# Patient Record
Sex: Male | Born: 1935
Health system: Southern US, Community
[De-identification: ages and names within clinical notes are randomized; demographics above are authoritative.]

## PROBLEM LIST (undated history)

## (undated) DIAGNOSIS — E782 Mixed hyperlipidemia: Secondary | ICD-10-CM

## (undated) DIAGNOSIS — M545 Low back pain, unspecified: Secondary | ICD-10-CM

## (undated) DIAGNOSIS — R569 Unspecified convulsions: Secondary | ICD-10-CM

## (undated) DIAGNOSIS — I493 Ventricular premature depolarization: Secondary | ICD-10-CM

## (undated) DIAGNOSIS — I219 Acute myocardial infarction, unspecified: Secondary | ICD-10-CM

## (undated) DIAGNOSIS — I5181 Takotsubo syndrome: Secondary | ICD-10-CM

## (undated) DIAGNOSIS — I739 Peripheral vascular disease, unspecified: Secondary | ICD-10-CM

## (undated) DIAGNOSIS — I341 Nonrheumatic mitral (valve) prolapse: Secondary | ICD-10-CM

## (undated) DIAGNOSIS — I6529 Occlusion and stenosis of unspecified carotid artery: Secondary | ICD-10-CM

## (undated) DIAGNOSIS — C649 Malignant neoplasm of unspecified kidney, except renal pelvis: Secondary | ICD-10-CM

## (undated) DIAGNOSIS — I2510 Atherosclerotic heart disease of native coronary artery without angina pectoris: Secondary | ICD-10-CM

## (undated) DIAGNOSIS — I779 Disorder of arteries and arterioles, unspecified: Secondary | ICD-10-CM

## (undated) DIAGNOSIS — I10 Essential (primary) hypertension: Secondary | ICD-10-CM

## (undated) DIAGNOSIS — Z9289 Personal history of other medical treatment: Secondary | ICD-10-CM

## (undated) DIAGNOSIS — G8929 Other chronic pain: Secondary | ICD-10-CM

## (undated) DIAGNOSIS — M1990 Unspecified osteoarthritis, unspecified site: Secondary | ICD-10-CM

## (undated) DIAGNOSIS — I499 Cardiac arrhythmia, unspecified: Secondary | ICD-10-CM

## (undated) DIAGNOSIS — R351 Nocturia: Secondary | ICD-10-CM

## (undated) HISTORY — DX: Essential (primary) hypertension: I10

## (undated) HISTORY — DX: Occlusion and stenosis of unspecified carotid artery: I65.29

## (undated) HISTORY — DX: Ventricular premature depolarization: I49.3

## (undated) HISTORY — DX: Mixed hyperlipidemia: E78.2

## (undated) HISTORY — PX: JOINT REPLACEMENT: SHX530

## (undated) HISTORY — DX: Malignant neoplasm of unspecified kidney, except renal pelvis: C64.9

## (undated) HISTORY — DX: Takotsubo syndrome: I51.81

## (undated) HISTORY — DX: Disorder of arteries and arterioles, unspecified: I77.9

## (undated) HISTORY — DX: Nonrheumatic mitral (valve) prolapse: I34.1

## (undated) HISTORY — PX: LITHOTRIPSY: SUR834

## (undated) HISTORY — PX: CARDIAC CATHETERIZATION: SHX172

## (undated) HISTORY — PX: APPENDECTOMY: SHX54

## (undated) HISTORY — PX: KNEE ARTHROSCOPY: SUR90

---

## 1999-07-12 ENCOUNTER — Encounter (INDEPENDENT_AMBULATORY_CARE_PROVIDER_SITE_OTHER): Payer: Self-pay | Admitting: *Deleted

## 1999-07-12 ENCOUNTER — Ambulatory Visit (HOSPITAL_COMMUNITY): Admission: RE | Admit: 1999-07-12 | Discharge: 1999-07-12 | Payer: Self-pay | Admitting: Gastroenterology

## 2000-01-11 ENCOUNTER — Encounter: Admission: RE | Admit: 2000-01-11 | Discharge: 2000-03-04 | Payer: Self-pay | Admitting: *Deleted

## 2002-07-31 ENCOUNTER — Ambulatory Visit (HOSPITAL_COMMUNITY): Admission: RE | Admit: 2002-07-31 | Discharge: 2002-07-31 | Payer: Self-pay | Admitting: Gastroenterology

## 2002-07-31 ENCOUNTER — Encounter (INDEPENDENT_AMBULATORY_CARE_PROVIDER_SITE_OTHER): Payer: Self-pay | Admitting: Specialist

## 2002-09-25 ENCOUNTER — Ambulatory Visit (HOSPITAL_COMMUNITY): Admission: RE | Admit: 2002-09-25 | Discharge: 2002-09-25 | Payer: Self-pay | Admitting: Gastroenterology

## 2004-06-20 ENCOUNTER — Ambulatory Visit: Payer: Self-pay | Admitting: Family Medicine

## 2004-10-23 ENCOUNTER — Ambulatory Visit: Payer: Self-pay | Admitting: Family Medicine

## 2005-02-22 ENCOUNTER — Ambulatory Visit: Payer: Self-pay | Admitting: Family Medicine

## 2005-06-26 ENCOUNTER — Ambulatory Visit: Payer: Self-pay | Admitting: Family Medicine

## 2005-07-10 ENCOUNTER — Ambulatory Visit: Payer: Self-pay | Admitting: Family Medicine

## 2005-08-23 ENCOUNTER — Ambulatory Visit: Payer: Self-pay | Admitting: Family Medicine

## 2005-09-20 ENCOUNTER — Ambulatory Visit: Payer: Self-pay | Admitting: Family Medicine

## 2005-09-21 ENCOUNTER — Ambulatory Visit: Payer: Self-pay | Admitting: Family Medicine

## 2005-11-27 ENCOUNTER — Ambulatory Visit: Payer: Self-pay | Admitting: Family Medicine

## 2005-12-03 ENCOUNTER — Ambulatory Visit: Payer: Self-pay | Admitting: Family Medicine

## 2006-02-27 ENCOUNTER — Ambulatory Visit: Payer: Self-pay | Admitting: Family Medicine

## 2006-07-01 ENCOUNTER — Ambulatory Visit: Payer: Self-pay | Admitting: Family Medicine

## 2006-07-16 ENCOUNTER — Ambulatory Visit: Payer: Self-pay | Admitting: Physician Assistant

## 2006-07-30 ENCOUNTER — Ambulatory Visit: Payer: Self-pay | Admitting: Family Medicine

## 2006-08-06 ENCOUNTER — Ambulatory Visit: Payer: Self-pay | Admitting: Family Medicine

## 2006-08-15 ENCOUNTER — Ambulatory Visit: Payer: Self-pay | Admitting: Family Medicine

## 2006-08-20 HISTORY — PX: NEPHRECTOMY: SHX65

## 2006-09-10 ENCOUNTER — Ambulatory Visit: Payer: Self-pay | Admitting: Family Medicine

## 2006-09-18 ENCOUNTER — Ambulatory Visit: Payer: Self-pay | Admitting: Family Medicine

## 2006-09-24 ENCOUNTER — Ambulatory Visit: Payer: Self-pay | Admitting: Family Medicine

## 2006-10-14 ENCOUNTER — Ambulatory Visit: Payer: Self-pay | Admitting: Family Medicine

## 2006-10-18 ENCOUNTER — Ambulatory Visit (HOSPITAL_COMMUNITY): Admission: RE | Admit: 2006-10-18 | Discharge: 2006-10-18 | Payer: Self-pay | Admitting: Orthopaedic Surgery

## 2006-10-30 ENCOUNTER — Encounter: Admission: RE | Admit: 2006-10-30 | Discharge: 2007-01-28 | Payer: Self-pay | Admitting: Orthopaedic Surgery

## 2006-11-13 ENCOUNTER — Ambulatory Visit: Payer: Self-pay | Admitting: Family Medicine

## 2006-12-16 ENCOUNTER — Ambulatory Visit: Payer: Self-pay | Admitting: Family Medicine

## 2007-01-16 ENCOUNTER — Ambulatory Visit: Payer: Self-pay | Admitting: Family Medicine

## 2008-06-04 ENCOUNTER — Encounter: Payer: Self-pay | Admitting: Cardiology

## 2008-06-05 ENCOUNTER — Encounter: Payer: Self-pay | Admitting: Cardiology

## 2008-08-20 HISTORY — PX: TOTAL KNEE ARTHROPLASTY: SHX125

## 2009-03-11 ENCOUNTER — Encounter: Payer: Self-pay | Admitting: Cardiology

## 2009-03-12 ENCOUNTER — Encounter: Payer: Self-pay | Admitting: Cardiology

## 2009-03-14 ENCOUNTER — Encounter: Payer: Self-pay | Admitting: Cardiology

## 2009-03-23 ENCOUNTER — Encounter: Payer: Self-pay | Admitting: Cardiology

## 2009-04-05 ENCOUNTER — Encounter: Payer: Self-pay | Admitting: Cardiology

## 2009-04-06 ENCOUNTER — Ambulatory Visit: Payer: Self-pay | Admitting: Cardiology

## 2009-04-06 ENCOUNTER — Encounter: Payer: Self-pay | Admitting: Cardiology

## 2009-04-06 DIAGNOSIS — Z8679 Personal history of other diseases of the circulatory system: Secondary | ICD-10-CM | POA: Insufficient documentation

## 2009-04-06 DIAGNOSIS — I498 Other specified cardiac arrhythmias: Secondary | ICD-10-CM | POA: Insufficient documentation

## 2009-04-12 ENCOUNTER — Ambulatory Visit: Payer: Self-pay | Admitting: Cardiology

## 2009-04-12 ENCOUNTER — Encounter: Payer: Self-pay | Admitting: Cardiology

## 2009-04-21 ENCOUNTER — Encounter (INDEPENDENT_AMBULATORY_CARE_PROVIDER_SITE_OTHER): Payer: Self-pay | Admitting: *Deleted

## 2009-05-02 ENCOUNTER — Telehealth (INDEPENDENT_AMBULATORY_CARE_PROVIDER_SITE_OTHER): Payer: Self-pay | Admitting: *Deleted

## 2009-05-17 ENCOUNTER — Ambulatory Visit: Payer: Self-pay | Admitting: Cardiology

## 2009-05-24 ENCOUNTER — Ambulatory Visit: Payer: Self-pay | Admitting: Cardiology

## 2009-05-27 ENCOUNTER — Encounter: Payer: Self-pay | Admitting: Cardiology

## 2009-10-19 ENCOUNTER — Ambulatory Visit: Payer: Self-pay | Admitting: Cardiology

## 2009-10-25 ENCOUNTER — Encounter: Payer: Self-pay | Admitting: Cardiology

## 2010-09-06 ENCOUNTER — Encounter: Payer: Self-pay | Admitting: Cardiology

## 2010-09-19 NOTE — Assessment & Plan Note (Signed)
Summary: f51m -- agh   Visit Type:  Follow-up Primary Provider:  nyland  CC:  follow-up visit.  History of Present Illness: the patient is a 77 year old male with no significant history of coronary artery disease.  In the past he has been evaluated for bigeminy.  He does have normal LV function and essentially normal echocardiogram with no significant valvular heart disease.  Is also asymptomatic.  He denies any palpitations, presyncope or syncope.  Denies any chest pain orthopnea or PND.  He presents for routine follow-up today.  The patient has worn a 48-hour Holter monitor which showed no significant arrhythmias short of bigeminy.   Preventive Screening-Counseling & Management  Alcohol-Tobacco     Smoking Status: never  Current Problems (verified): 1)  Preoperative Examination  (ICD-V72.84) 2)  Cardiac Murmur, Hx of  (ICD-V12.50) 3)  Bigeminy  (ICD-427.89) 4)  Bradycardia  (ICD-427.89)  Current Medications (verified): 1)  Aspirin 81 Mg Tbec (Aspirin) .... Take One Tablet By Mouth Daily 2)  Multivitamins   Tabs (Multiple Vitamin) .... Once Daily 3)  Simvastatin 40 Mg Tabs (Simvastatin) .... Take One Tablet By Mouth Daily At Bedtime 4)  Quinapril Hcl 40 Mg Tabs (Quinapril Hcl) .... Once By Mouth  Daily 5)  Furosemide 20 Mg Tabs (Furosemide) .... Take One Tablet By Mouth Daily. 6)  Felodipine 2.5 Mg Xr24h-Tab (Felodipine) .... Take 1 Tablet By Mouth Once A Day 7)  Benicar Hct 20-12.5 Mg Tabs (Olmesartan Medoxomil-Hctz) .... Take 1 Tablet By Mouth Once A Day  Allergies (verified): No Known Drug Allergies  Comments:  Nurse/Medical Assistant: The patient's medications and allergies were reviewed with the patient and were updated in the Medication and Allergy Lists. List reviewed.  Past History:  Past Medical History: Last updated: 04/06/2009 hypertension Hyperlipidemia  Past Surgical History: Last updated: 04/06/2009 Nephrectomy-Native Appendectomy Knee  Arthroscopy  Family History: Last updated: 04/06/2009 Family History of Cancer:  Family History of Coronary Artery Disease:  Family History of CVA or Stroke:  Family History of Hypertension:  Family History of Renal Disease:   Social History: Last updated: 04/06/2009 Retired  Married  Tobacco Use - No.  Alcohol Use - no Regular Exercise - no Drug Use - no  Risk Factors: Exercise: no (04/06/2009)  Risk Factors: Smoking Status: never (10/19/2009)  Vital Signs:  Patient profile:   77 year old male Height:      73 inches Weight:      222 pounds Pulse rate:   72 / minute BP sitting:   159 / 66  (left arm) Cuff size:   large  Vitals Entered By: Carlye Grippe (October 19, 2009 9:37 AM) CC: follow-up visit   Physical Exam  Additional Exam:  General: Well-developed, well-nourished in no distress head: Normocephalic and atraumatic eyes PERRLA/EOMI intact, conjunctiva and lids normal nose: No deformity or lesions mouth normal dentition, normal posterior pharynx neck: Supple, no JVD.  No masses, thyromegaly or abnormal cervical nodes.  No carotid bruits lungs: Normal breath sounds bilaterally without wheezing.  Normal percussion heart: regular rate and rhythm with normal S1 and S2, no S3 or S4.  PMI is normal.  No pathological murmurs abdomen: Normal bowel sounds, abdomen is soft and nontender without masses, organomegaly or hernias noted.  No hepatosplenomegaly musculoskeletal: Back normal, normal gait muscle strength and tone normal pulsus: Pulse is normal in all 4 extremities Extremities: No peripheral pitting edema neurologic: Alert and oriented x 3 skin: Intact without lesions or rashes cervical nodes: No significant adenopathy  psychologic: Normal affect    EKG  Procedure date:  10/19/2009  Findings:      normal sinus rhythm with frequent premature ventricular complexes with ventricular bigeminy.  Nonspecific ST-T wave changes.  Impression &  Recommendations:  Problem # 1:  BIGEMINY (ICD-427.89) the patient has by EKG ongoing asymptomatic ventricular bigeminy.  He reports no syncope or presyncope or palpitations.  His LV function is within normal limits. His updated medication list for this problem includes:    Aspirin 81 Mg Tbec (Aspirin) .Marland Kitchen... Take one tablet by mouth daily    Quinapril Hcl 40 Mg Tabs (Quinapril hcl) ..... Once by mouth  daily    Felodipine 2.5 Mg Xr24h-tab (Felodipine) .Marland Kitchen... Take 1 tablet by mouth once a day  Problem # 2:  CARDIAC MURMUR, HX OF (ICD-V12.50) the patient has no significant valvular heart disease by echocardiogram  Problem # 3:  BRADYCARDIA (ICD-427.89) no evidence of significant bradycardia by either Holter monitor or today's physical examination. His updated medication list for this problem includes:    Aspirin 81 Mg Tbec (Aspirin) .Marland Kitchen... Take one tablet by mouth daily    Quinapril Hcl 40 Mg Tabs (Quinapril hcl) ..... Once by mouth  daily    Felodipine 2.5 Mg Xr24h-tab (Felodipine) .Marland Kitchen... Take 1 tablet by mouth once a day  Orders: EKG w/ Interpretation (93000)  Patient Instructions: 1)  Your physician recommends that you continue on your current medications as directed. Please refer to the Current Medication list given to you today. 2)  Follow up in  1 year.

## 2010-10-27 ENCOUNTER — Ambulatory Visit (INDEPENDENT_AMBULATORY_CARE_PROVIDER_SITE_OTHER): Payer: MEDICARE | Admitting: Cardiology

## 2010-10-27 ENCOUNTER — Encounter: Payer: Self-pay | Admitting: Physician Assistant

## 2010-10-27 DIAGNOSIS — I38 Endocarditis, valve unspecified: Secondary | ICD-10-CM

## 2010-10-27 DIAGNOSIS — I10 Essential (primary) hypertension: Secondary | ICD-10-CM

## 2010-11-07 NOTE — Assessment & Plan Note (Signed)
Summary: 77YR FUL FH   Visit Type:  Follow-up Primary Provider:  Darcel Bayley Nyland,MD   History of Present Illness: patient presents for annual followup.  He denies interim development of signs or symptoms suggestive of UA P, DOE, or tachypalpitations. He has been diagnosed with vertigo, but denies any gait instability, falls, or syncope.  He has occasional palpitations, but has history of documented ventricular bigeminy, by EKG and Holter monitoring, with no other significant arrhythmias. He has history of normal LVF, with no significant valvular abnormalities.  Preventive Screening-Counseling & Management  Alcohol-Tobacco     Smoking Status: never  Current Medications (verified): 1)  Aspirin 81 Mg Tbec (Aspirin) .... Take One Tablet By Mouth Daily 2)  Multivitamins   Tabs (Multiple Vitamin) .... Once Daily 3)  Atorvastatin Calcium 40 Mg Tabs (Atorvastatin Calcium) .... Take 1/2 Tablet By Mouth Once A Day 4)  Quinapril Hcl 40 Mg Tabs (Quinapril Hcl) .... Once By Mouth  Daily 5)  Furosemide 20 Mg Tabs (Furosemide) .... Take One Tablet By Mouth Daily. 6)  Felodipine 2.5 Mg Xr24h-Tab (Felodipine) .... Take 1 Tablet By Mouth Once A Day 7)  Benicar Hct 20-12.5 Mg Tabs (Olmesartan Medoxomil-Hctz) .... Take 1 Tablet By Mouth Once A Day 8)  Travatan Z 0.004 % Soln (Travoprost) .... One Drop Ou Daily  Allergies (verified): No Known Drug Allergies  Comments:  Nurse/Medical Assistant: The patient's medication bottles and allergies were reviewed with the patient and were updated in the Medication and Allergy Lists.  Past History:  Past Medical History: hypertension Hyperlipidemia ventricular ectopy/bigeminy Normal LVF valvular heart disease... mild MVP/MR by 2-D echo, 8/10  Review of Systems       No fevers, chills, hemoptysis, dysphagia, melena, hematocheezia, hematuria, rash, claudication, orthopnea, pnd, pedal edema. All other systems negative.   Vital Signs:  Patient  profile:   77 year old male Height:      73 inches Weight:      221 pounds BMI:     29.26 Pulse rate:   66 / minute BP sitting:   151 / 77  (left arm) Cuff size:   large  Vitals Entered By: Carlye Grippe (October 27, 2010 2:08 PM)  Nutrition Counseling: Patient's BMI is greater than 25 and therefore counseled on weight management options.  Physical Exam  Additional Exam:  GEN: 77 year old male, no distress HEENT: NCAT,PERRLA,EOMI NECK: palpable pulses, no bruits; no JVD; no TM LUNGS: CTA bilaterally HEART: RRR (S1S2); no significant murmurs; no rubs; no gallops ABD: soft, NT; intact BS EXT: intact distal pulses; 1+ bilateral, nonpitting edema SKIN: warm, dry MUSC: no obvious deformity NEURO: A/O (x3)     EKG  Procedure date:  10/27/2010  Findings:      normal sinus rhythm at 70 bpm, with ventricular bigeminy; normal axis; nonspecific ST changes  Impression & Recommendations:  Problem # 1:  BIGEMINY (ICD-427.89)  long-standing, documented PVCs and bigeminy, with no acceleration by history. No further workup, or medication adjustments, at this time. Schedule return follow up with Dr. Andee Lineman in one year, or sooner if needed  Problem # 2:  HYPERTENSION (ICD-401.9)  followed by Dr. Lysbeth Galas. Patient advised to occasionally monitor this in the ambulatory setting, maintaining readings less than 140/90.  Problem # 3:  VALVULAR HEART DISEASE (ICD-424.90)  mild MVP/MR by 2-D echo, 8/10. continue monitoring clinically.  Other Orders: EKG w/ Interpretation (93000)  Patient Instructions: 1)  Your physician wants you to follow-up in: 1 year. You will receive a  reminder letter in the mail one-two months in advance. If you don't receive a letter, please call our office to schedule the follow-up appointment.

## 2011-01-05 NOTE — Op Note (Signed)
NAME:  Darius Baxter, Darius Baxter                         ACCOUNT NO.:  [id number]   MEDICAL RECORD NO.:  [id number]                   PATIENT TYPE:  AMB   LOCATION:  ENDO                                 FACILITY:  [facility]   PHYSICIAN:  John C. Madilyn Fireman, M.D.                 DATE OF BIRTH:  1934-07-15   DATE OF PROCEDURE:  07/31/2002  DATE OF DISCHARGE:                                 OPERATIVE REPORT   PROCEDURE:  Colonoscopy with polypectomy.   INDICATIONS FOR PROCEDURE:  History of adenomatous colon polyp at  colonoscopy three years ago.   DESCRIPTION OF PROCEDURE:  The patient was placed in the left lateral  decubitus position and placed on the pulse monitor with continuous low-flow  oxygen delivered by nasal cannula.  He was sedated with 75 mcg IV fentanyl  and 6 mg IV Versed.  The Olympus video colonoscope was inserted into the  rectum and advanced to the cecum, confirmed by transillumination at  McBurney's point and visualization of the ileocecal valve and appendiceal  orifice.  The prep was fairly good, but there were some areas where the prep  was suboptimal, and I cannot rule out small lesions less than 1 cm in all  areas.  The cecum appeared normal.  Within the mid ascending colon, there  was a 1.5-2 cm multilobulated polyp or sessile polypoid mass that was  vaguely delineated from the surrounding mucosa that was elevated  significantly.  It was difficult to position the scope in a manner that  would allow me to direct the snare down to the base of it due to the  location just around a bend, and this made assessment somewhat difficult.  I  was able to ensnare about an estimated third of the visible area of the  polyp and send for histology.  I then used the closed hot biopsy forceps to  fulgurate as much of the surface as possible, but it was clear that the  lesion was not entirely removed but fulgurated.  The remainder of the  ascending, transverse, descending, and sigmoid and rectum  appeared normal  with no further polyps, masses, diverticula, or other mucosal abnormalities.  The scope was then withdrawn, and the patient returned to the recovery room  in stable condition.  He tolerated the procedure well, and there were no  immediate complications.   IMPRESSION:  Multilobulated ascending colon polyp or polypoid mass.   PLAN:  Await histology and if severe dysplasia or carcinoma seen, will  likely need surgery.  If not, a repeat attempt at colonoscopic destruction  of the lesion in 2-3 months may be worthwhile.                                               Jonny Ruiz  Morrie Sheldon, M.D.    JCH/MEDQ  D:  07/31/2002  T:  07/31/2002  Job:  [id number]

## 2011-01-05 NOTE — Op Note (Signed)
   NAME:  Darius Baxter, Darius Baxter                         ACCOUNT NO.:  [id number]   MEDICAL RECORD NO.:  [id number]                   PATIENT TYPE:  AMB   LOCATION:  ENDO                                 FACILITY:  [facility]   PHYSICIAN:  John C. Madilyn Fireman, M.D.                 DATE OF BIRTH:  May 04, 1934   DATE OF PROCEDURE:  09/25/2002  DATE OF DISCHARGE:                                 OPERATIVE REPORT   PROCEDURE:  Colonoscopy.   INDICATIONS FOR PROCEDURE:  Sessile ascending colon polyp, incompletely  removed two months ago with histology showing tubular adenoma.  Procedure is  to try to attempt complete fulguration of the polyp today.   DESCRIPTION OF PROCEDURE:  The patient was placed in the left lateral  decubitus position and placed on the pulse monitor with continuous low-flow  oxygen delivered by nasal cannula.  He was sedated with 75 mcg IV fentanyl  and 6 mg IV Versed.  The Olympus video colonoscope was inserted into the  rectum and advanced to the cecum, confirmed by transillumination at  McBurney's point and visualization of the ileocecal valve and appendiceal  orifice.  The prep was excellent.  The cecum appeared normal.  Within the  mid ascending colon, there was evidence of the previous polyp, although  there was not a lot of visible polyp tissue.  There was some fibrosis and  inflammation from the previous polypectomy site and some vaguely delineated  polyp tissue.  I elected to use the forward-oriented Erbe probe to fulgurate  all visible surface that appeared to contain adenoma.  This was achieved on  the last attempt at laser application; however, some bleeding ensued, and it  took 2-3 more applications to get the bleeding to stop.  It appeared that  the entire surface of the polyp was coagulated and probably destroyed.  The  remainder of the ascending, transverse, descending, sigmoid, and rectum all  appeared normal with no masses, polyps, diverticula, or other mucosal  abnormalities.  The scope was then withdrawn, and the patient returned to  the recovery room in stable condition.  He tolerated the procedure well, and  there were no immediate complications.   IMPRESSION:  Residual ascending colon polyps, fulgurated with the Erbe argon  plasma coagulator.   PLAN:  Repeat colonoscopy in three years.                                               John C. Madilyn Fireman, M.D.    JCH/MEDQ  D:  09/25/2002  T:  09/25/2002  Job:  [id number]

## 2011-02-22 ENCOUNTER — Encounter: Payer: Self-pay | Admitting: Physician Assistant

## 2011-03-15 ENCOUNTER — Encounter (HOSPITAL_COMMUNITY)
Admission: RE | Admit: 2011-03-15 | Discharge: 2011-03-15 | Disposition: A | Discharge status: Home / Self Care | Payer: Medicare Other | Source: Ambulatory Visit | Attending: Ophthalmology | Admitting: Ophthalmology

## 2011-03-15 ENCOUNTER — Encounter (HOSPITAL_COMMUNITY): Payer: Self-pay

## 2011-03-15 ENCOUNTER — Other Ambulatory Visit: Payer: Self-pay

## 2011-03-15 HISTORY — DX: Unspecified convulsions: R56.9

## 2011-03-15 HISTORY — DX: Unspecified osteoarthritis, unspecified site: M19.90

## 2011-03-15 LAB — BASIC METABOLIC PANEL
BUN: 30 mg/dL — ABNORMAL HIGH (ref 6–23)
CO2: 26 mEq/L (ref 19–32)
Chloride: 103 mEq/L (ref 96–112)
GFR calc non Af Amer: 53 mL/min — ABNORMAL LOW (ref >60)
Glucose, Bld: 95 mg/dL (ref 70–99)

## 2011-03-15 LAB — CBC
HCT: 40.4 % (ref 39.0–52.0)
MCHC: 33.7 g/dL (ref 30.0–36.0)
RBC: 4.43 MIL/uL (ref 4.22–5.81)
RDW: 13.5 % (ref 11.5–15.5)
WBC: 7 10*3/uL (ref 4.0–10.5)

## 2011-03-15 NOTE — Patient Instructions (Addendum)
22 Darius Baxter  03/15/2011   Your procedure is scheduled on:  03/22/2011  Report to [facility] at  620 AM.  Call this number if you have problems the morning of surgery: [phone]   Remember:   Do not eat food:After Midnight.  Do not drink clear liquids: After Midnight.  Take these medicines the morning of surgery with A SIP OF WATER: Plendil,Benicar,Quinapril,accupril   Do not wear jewelry, make-up or nail polish.  Do not bring valuables to the hospital.  Contacts, dentures or bridgework may not be worn into surgery.  Leave suitcase in the car. After surgery it may be brought to your room.  For patients admitted to the hospital, checkout time is 11:00 AM the day of discharge.   Patients discharged the day of surgery will not be allowed to drive home.  Name and phone number of your driver: wife  Special Instructions: N/A   Please read over the following fact sheets that you were given: Pain Booklet, Surgical Site Infection Prevention and Anesthesia Post-op Instructions PATIENT INSTRUCTIONS POST-ANESTHESIA  IMMEDIATELY FOLLOWING SURGERY:  Do not drive or operate machinery for the first twenty four hours after surgery.  Do not make any important decisions for twenty four hours after surgery or while taking narcotic pain medications or sedatives.  If you develop intractable nausea and vomiting or a severe headache please notify your doctor immediately.  FOLLOW-UP:  Please make an appointment with your surgeon as instructed. You do not need to follow up with anesthesia unless specifically instructed to do so.  WOUND CARE INSTRUCTIONS (if applicable):  Keep a dry clean dressing on the anesthesia/puncture wound site if there is drainage.  Once the wound has quit draining you may leave it open to air.  Generally you should leave the bandage intact for twenty four hours unless there is drainage.  If the epidural site drains for more than 36-48 hours please call the anesthesia  department.  QUESTIONS?:  Please feel free to call your physician or the hospital operator if you have any questions, and they will be happy to assist you.     [facility] Anesthesia Department [address] [place] [phone]

## 2011-03-22 ENCOUNTER — Ambulatory Visit (HOSPITAL_COMMUNITY)
Admission: RE | Admit: 2011-03-22 | Discharge: 2011-03-22 | Disposition: A | Discharge status: Home / Self Care | Payer: Medicare Other | Source: Ambulatory Visit | Attending: Ophthalmology | Admitting: Ophthalmology

## 2011-03-22 ENCOUNTER — Encounter (HOSPITAL_COMMUNITY): Payer: Self-pay | Admitting: Anesthesiology

## 2011-03-22 ENCOUNTER — Ambulatory Visit (HOSPITAL_COMMUNITY): Payer: Medicare Other | Admitting: Anesthesiology

## 2011-03-22 ENCOUNTER — Encounter (HOSPITAL_COMMUNITY): Payer: Self-pay | Admitting: *Deleted

## 2011-03-22 ENCOUNTER — Encounter (HOSPITAL_COMMUNITY)
Admission: RE | Disposition: A | Discharge status: Home / Self Care | Payer: Self-pay | Source: Ambulatory Visit | Attending: Ophthalmology

## 2011-03-22 DIAGNOSIS — I10 Essential (primary) hypertension: Secondary | ICD-10-CM | POA: Insufficient documentation

## 2011-03-22 DIAGNOSIS — Z01812 Encounter for preprocedural laboratory examination: Secondary | ICD-10-CM | POA: Insufficient documentation

## 2011-03-22 DIAGNOSIS — Z79899 Other long term (current) drug therapy: Secondary | ICD-10-CM | POA: Insufficient documentation

## 2011-03-22 DIAGNOSIS — H2510 Age-related nuclear cataract, unspecified eye: Secondary | ICD-10-CM | POA: Insufficient documentation

## 2011-03-22 DIAGNOSIS — Z7982 Long term (current) use of aspirin: Secondary | ICD-10-CM | POA: Insufficient documentation

## 2011-03-22 HISTORY — PX: CATARACT EXTRACTION W/PHACO: SHX586

## 2011-03-22 SURGERY — PHACOEMULSIFICATION, CATARACT, WITH IOL INSERTION
Anesthesia: Monitor Anesthesia Care | Site: Eye | Laterality: Right | Wound class: Clean

## 2011-03-22 MED ORDER — TETRACAINE HCL 0.5 % OP SOLN
OPHTHALMIC | Status: AC
  Administered 2011-03-22: 1 [drp] via OPHTHALMIC
  Filled 2011-03-22: qty 2

## 2011-03-22 MED ORDER — LACTATED RINGERS IV SOLN
INTRAVENOUS | Status: DC | PRN
  Administered 2011-03-22: 07:00:00 via INTRAVENOUS

## 2011-03-22 MED ORDER — LIDOCAINE HCL 3.5 % OP GEL
OPHTHALMIC | Status: AC
  Administered 2011-03-22: 1 via OPHTHALMIC
  Filled 2011-03-22: qty 5

## 2011-03-22 MED ORDER — LIDOCAINE HCL 3.5 % OP GEL
1.0000 "application " | Freq: Once | OPHTHALMIC | Status: AC
  Administered 2011-03-22: 1 via OPHTHALMIC

## 2011-03-22 MED ORDER — LACTATED RINGERS IV SOLN
INTRAVENOUS | Status: DC
  Administered 2011-03-22: 07:00:00 via INTRAVENOUS

## 2011-03-22 MED ORDER — LIDOCAINE HCL (PF) 1 % IJ SOLN
INTRAMUSCULAR | Status: AC
  Filled 2011-03-22: qty 2

## 2011-03-22 MED ORDER — CYCLOPENTOLATE-PHENYLEPHRINE 0.2-1 % OP SOLN
1.0000 [drp] | OPHTHALMIC | Status: AC
  Administered 2011-03-22 (×3): 1 [drp] via OPHTHALMIC

## 2011-03-22 MED ORDER — LIDOCAINE HCL (PF) 1 % IJ SOLN
INTRAMUSCULAR | Status: DC | PRN
  Administered 2011-03-22: .2 mL

## 2011-03-22 MED ORDER — MIDAZOLAM HCL 2 MG/2ML IJ SOLN
INTRAMUSCULAR | Status: AC
  Filled 2011-03-22: qty 2

## 2011-03-22 MED ORDER — PROVISC 10 MG/ML IO SOLN
INTRAOCULAR | Status: DC | PRN
  Administered 2011-03-22: 8.5 mg via OPHTHALMIC

## 2011-03-22 MED ORDER — PHENYLEPHRINE HCL 2.5 % OP SOLN
OPHTHALMIC | Status: AC
  Administered 2011-03-22: 1 [drp] via OPHTHALMIC
  Filled 2011-03-22: qty 2

## 2011-03-22 MED ORDER — MIDAZOLAM HCL 2 MG/2ML IJ SOLN
1.0000 mg | INTRAMUSCULAR | Status: DC | PRN
Start: 2011-03-22 — End: 2011-03-22
  Administered 2011-03-22: 2 mg via INTRAVENOUS

## 2011-03-22 MED ORDER — NEOMYCIN-POLYMYXIN-DEXAMETH 3.5-10000-0.1 OP OINT
TOPICAL_OINTMENT | OPHTHALMIC | Status: AC
  Filled 2011-03-22: qty 3.5

## 2011-03-22 MED ORDER — TETRACAINE HCL 0.5 % OP SOLN
1.0000 [drp] | OPHTHALMIC | Status: AC
  Administered 2011-03-22 (×3): 1 [drp] via OPHTHALMIC

## 2011-03-22 MED ORDER — EPINEPHRINE HCL 1 MG/ML IJ SOLN
INTRAOCULAR | Status: DC | PRN
  Administered 2011-03-22: 08:00:00

## 2011-03-22 MED ORDER — BSS IO SOLN
INTRAOCULAR | Status: DC | PRN
  Administered 2011-03-22: 15 mL via OPHTHALMIC

## 2011-03-22 MED ORDER — PHENYLEPHRINE HCL 2.5 % OP SOLN
1.0000 [drp] | OPHTHALMIC | Status: AC
  Administered 2011-03-22 (×3): 1 [drp] via OPHTHALMIC

## 2011-03-22 MED ORDER — POVIDONE-IODINE 5 % OP SOLN
OPHTHALMIC | Status: DC | PRN
  Administered 2011-03-22: 1 via OPHTHALMIC

## 2011-03-22 MED ORDER — NEOMYCIN-POLYMYXIN-DEXAMETH 0.1 % OP OINT
TOPICAL_OINTMENT | OPHTHALMIC | Status: DC | PRN
  Administered 2011-03-22: 1 via OPHTHALMIC

## 2011-03-22 MED ORDER — CYCLOPENTOLATE-PHENYLEPHRINE 0.2-1 % OP SOLN
OPHTHALMIC | Status: AC
  Administered 2011-03-22: 1 [drp] via OPHTHALMIC
  Filled 2011-03-22: qty 2

## 2011-03-22 MED ORDER — LIDOCAINE HCL 3.5 % OP GEL
OPHTHALMIC | Status: DC | PRN
  Administered 2011-03-22: 1 via OPHTHALMIC

## 2011-03-22 SURGICAL SUPPLY — 34 items
CAPSULAR TENSION RING-AMO (OPHTHALMIC RELATED) IMPLANT
CLOTH BEACON ORANGE TIMEOUT ST (SAFETY) ×2 IMPLANT
DUOVISC SYSTEM (INTRAOCULAR LENS)
EYE SHIELD UNIVERSAL CLEAR (GAUZE/BANDAGES/DRESSINGS) ×2 IMPLANT
GLOVE BIO SURGEON STRL SZ 6.5 (GLOVE) IMPLANT
GLOVE BIOGEL PI IND STRL 6.5 (GLOVE) ×1 IMPLANT
GLOVE BIOGEL PI IND STRL 7.0 (GLOVE) IMPLANT
GLOVE BIOGEL PI IND STRL 7.5 (GLOVE) IMPLANT
GLOVE BIOGEL PI INDICATOR 6.5 (GLOVE) ×1
GLOVE BIOGEL PI INDICATOR 7.0 (GLOVE)
GLOVE BIOGEL PI INDICATOR 7.5 (GLOVE)
GLOVE ECLIPSE 6.5 STRL STRAW (GLOVE) IMPLANT
GLOVE ECLIPSE 7.0 STRL STRAW (GLOVE) IMPLANT
GLOVE ECLIPSE 7.5 STRL STRAW (GLOVE) IMPLANT
GLOVE EXAM NITRILE LRG STRL (GLOVE) ×2 IMPLANT
GLOVE EXAM NITRILE MD LF STRL (GLOVE) IMPLANT
GLOVE SKINSENSE NS SZ6.5 (GLOVE)
GLOVE SKINSENSE NS SZ7.0 (GLOVE)
GLOVE SKINSENSE STRL SZ6.5 (GLOVE) IMPLANT
GLOVE SKINSENSE STRL SZ7.0 (GLOVE) IMPLANT
KIT VITRECTOMY (OPHTHALMIC RELATED) IMPLANT
PAD ARMBOARD 7.5X6 YLW CONV (MISCELLANEOUS) ×2 IMPLANT
PROC W NO LENS (INTRAOCULAR LENS)
PROC W SPEC LENS (INTRAOCULAR LENS)
PROCESS W NO LENS (INTRAOCULAR LENS) IMPLANT
PROCESS W SPEC LENS (INTRAOCULAR LENS) IMPLANT
RING MALYGIN (MISCELLANEOUS) IMPLANT
SIGHTPATH CAT PROC W REG LENS (Ophthalmic Related) ×2 IMPLANT
SYR TB 1ML LL NO SAFETY (SYRINGE) ×2 IMPLANT
SYSTEM DUOVISC (INTRAOCULAR LENS) IMPLANT
TAPE SURG TRANSPORE 1 IN (GAUZE/BANDAGES/DRESSINGS) ×1 IMPLANT
TAPE SURGICAL TRANSPORE 1 IN (GAUZE/BANDAGES/DRESSINGS) ×1
VISCOELASTIC ADDITIONAL (OPHTHALMIC RELATED) IMPLANT
WATER STERILE IRR 250ML POUR (IV SOLUTION) ×2 IMPLANT

## 2011-03-22 NOTE — Op Note (Signed)
NAMEJAYAN, Darius NO.:  [id number]  MEDICAL RECORD NO.:  [id number]  LOCATION:  [facility]PO                          FACILITY:  [facility]  PHYSICIAN:  Susanne Greenhouse, MD       DATE OF BIRTH:  1934/07/21  DATE OF PROCEDURE:  03/22/2011 DATE OF DISCHARGE:  03/22/2011                              OPERATIVE REPORT   PREOPERATIVE DIAGNOSIS:  Nuclear cataract, right eye, diagnosis code 366.16.  POSTOPERATIVE DIAGNOSIS:  Nuclear cataract, right eye, diagnosis code 366.16.  OPERATION PERFORMED:  Phacoemulsification with posterior chamber intraocular lens implantation, right eye.  SURGEON:  Bonne Dolores. Cristalle Rohm, MD  ANESTHESIA:  General endotracheal anesthesia.  OPERATIVE SUMMARY:  In the preoperative area, dilating drops were placed into the right eye.  The patient was then brought into the operating room where he was placed under general anesthesia.  The eye was then prepped and draped.  Beginning with a 75 blade, a paracentesis port was made at the surgeon's 2 o'clock position.  The anterior chamber was then filled with a 1% nonpreserved lidocaine solution with epinephrine.  This was followed by Viscoat to deepen the chamber.  A small fornix-based peritomy was performed superiorly.  Next, a single iris hook was placed through the limbus superiorly.  A 2.4-mm keratome blade was then used to make a clear corneal incision over the iris hook.  A bent cystotome needle and Utrata forceps were used to create a continuous tear capsulotomy.  Hydrodissection was performed using balanced salt solution on a fine cannula.  The lens nucleus was then removed using phacoemulsification in a quadrant cracking technique.  The cortical material was then removed with irrigation and aspiration.  The capsular bag and anterior chamber were refilled with Provisc.  The wound was widened to approximately 3 mm and a posterior chamber intraocular lens was placed into the capsular bag without difficulty  using an [employer] lens injecting system.  A single 10-0 nylon suture was then used to close the incision as well as stromal hydration.  The Provisc was removed from the anterior chamber and capsular bag with irrigation and aspiration.  At this point, the wounds were tested for leak, which were negative.  The anterior chamber remained deep and stable.  The patient tolerated the procedure well.  There were no operative complications, and he awoke from general anesthesia without problem.  No surgical specimens.  Prosthetic device used is a Lenstec posterior chamber lens, model Softec HD, power of 19.25, serial number is [id number].          ______________________________ Susanne Greenhouse, MD     KEH/MEDQ  D:  03/22/2011  T:  03/22/2011  Job:  [id number]

## 2011-03-22 NOTE — Anesthesia Preprocedure Evaluation (Addendum)
Anesthesia Evaluation  Name, MR# and DOB Patient awake  General Assessment Comment  Reviewed: Allergy & Precautions, H&P  and Patient's Chart, lab work & pertinent test results  History of Anesthesia Complications Negative for: history of anesthetic complications  Airway Mallampati: II      Dental  (+) Edentulous Upper   Pulmonary  clear to auscultation    Cardiovascular hypertension, Pt. on medications + dysrhythmias Irregular Normal   Neuro/Psych (+) {AN ROS/MED HX NEURO HEADACHES Seizures - and Well Controlled,   GI/Hepatic/Renal   Endo/Other   Abdominal   Musculoskeletal  Hematology   Peds  Reproductive/Obstetrics   Anesthesia Other Findings             Anesthesia Physical Anesthesia Plan  ASA: III  Anesthesia Plan: MAC   Post-op Pain Management:    Induction:   Airway Management Planned: Nasal Cannula  Additional Equipment:   Intra-op Plan:   Post-operative Plan:   Informed Consent: I have reviewed the patients History and Physical, chart, labs and discussed the procedure including the risks, benefits and alternatives for the proposed anesthesia with the patient or authorized representative who has indicated his/her understanding and acceptance.     Plan Discussed with:   Anesthesia Plan Comments:         Anesthesia Quick Evaluation

## 2011-03-22 NOTE — Brief Op Note (Signed)
03/22/2011  8:24 AM  PATIENT:  Darius Baxter  77 y.o. male  PRE-OPERATIVE DIAGNOSIS:  nuclear cataract right eye  POST-OPERATIVE DIAGNOSIS:  nuclear cataract right eye  PROCEDURE:  Procedure(s): CATARACT EXTRACTION PHACO AND INTRAOCULAR LENS PLACEMENT (IOC)  SURGEON:  Surgeon(s): Gemma Payor  ANESTHESIA:  Local with MAC  ESTIMATED BLOOD LOSS: None

## 2011-03-22 NOTE — Progress Notes (Signed)
0800_Pt in [place] Dr Jayme Cloud notified and no orders given.

## 2011-03-22 NOTE — Anesthesia Postprocedure Evaluation (Signed)
  Anesthesia Post-op Note  Patient: Darius Baxter  Procedure(s) Performed:  CATARACT EXTRACTION PHACO AND INTRAOCULAR LENS PLACEMENT (IOC) - CDE  19.88  Patient Location: PACU and Short Stay  Anesthesia Type: MAC  Level of Consciousness: awake, alert , oriented and patient cooperative  Airway and Oxygen Therapy: Patient Spontanous Breathing  Post-op Pain: none  Post-op Assessment: Post-op Vital signs reviewed and Patient's Cardiovascular Status Stable  Post-op Vital Signs: stable  Complications: No apparent anesthesia complications

## 2011-03-22 NOTE — H&P (Signed)
I have evaluated the patient preoperatively, and have identified no interval changes in medical condition and plan of care since the history and physical of record 

## 2011-03-22 NOTE — Transfer of Care (Signed)
Immediate Anesthesia Transfer of Care Note  Patient: Darius Baxter  Procedure(s) Performed:  CATARACT EXTRACTION PHACO AND INTRAOCULAR LENS PLACEMENT (IOC) - CDE  19.88  Patient Location: PACU and Short Stay  Anesthesia Type: MAC  Level of Consciousness: awake, alert , oriented and patient cooperative  Airway & Oxygen Therapy: Patient Spontanous Breathing  Post-op Assessment: Report given to PACU RN and Post -op Vital signs reviewed and stable  Post vital signs: stable  Complications: No apparent anesthesia complications

## 2011-03-23 NOTE — Progress Notes (Signed)
Message left for pt

## 2011-04-02 ENCOUNTER — Encounter (HOSPITAL_COMMUNITY): Payer: Self-pay | Admitting: Ophthalmology

## 2011-04-11 ENCOUNTER — Encounter (HOSPITAL_COMMUNITY): Payer: Self-pay

## 2011-04-11 ENCOUNTER — Encounter (HOSPITAL_COMMUNITY)
Admission: RE | Admit: 2011-04-11 | Discharge: 2011-04-11 | Payer: Medicare Other | Source: Ambulatory Visit | Admitting: Ophthalmology

## 2011-04-16 ENCOUNTER — Ambulatory Visit (HOSPITAL_COMMUNITY): Payer: Medicare Other | Admitting: Anesthesiology

## 2011-04-16 ENCOUNTER — Encounter (HOSPITAL_COMMUNITY)
Admission: RE | Disposition: A | Discharge status: Home / Self Care | Payer: Self-pay | Source: Ambulatory Visit | Attending: Ophthalmology

## 2011-04-16 ENCOUNTER — Encounter (HOSPITAL_COMMUNITY): Payer: Self-pay | Admitting: Anesthesiology

## 2011-04-16 ENCOUNTER — Ambulatory Visit (HOSPITAL_COMMUNITY)
Admission: RE | Admit: 2011-04-16 | Discharge: 2011-04-16 | Disposition: A | Discharge status: Home / Self Care | Payer: Medicare Other | Source: Ambulatory Visit | Attending: Ophthalmology | Admitting: Ophthalmology

## 2011-04-16 ENCOUNTER — Encounter (HOSPITAL_COMMUNITY): Payer: Self-pay | Admitting: *Deleted

## 2011-04-16 DIAGNOSIS — Z79899 Other long term (current) drug therapy: Secondary | ICD-10-CM | POA: Insufficient documentation

## 2011-04-16 DIAGNOSIS — Z7982 Long term (current) use of aspirin: Secondary | ICD-10-CM | POA: Insufficient documentation

## 2011-04-16 DIAGNOSIS — H2510 Age-related nuclear cataract, unspecified eye: Secondary | ICD-10-CM | POA: Insufficient documentation

## 2011-04-16 DIAGNOSIS — I10 Essential (primary) hypertension: Secondary | ICD-10-CM | POA: Insufficient documentation

## 2011-04-16 HISTORY — PX: CATARACT EXTRACTION W/PHACO: SHX586

## 2011-04-16 SURGERY — PHACOEMULSIFICATION, CATARACT, WITH IOL INSERTION
Anesthesia: Monitor Anesthesia Care | Site: Eye | Laterality: Left | Wound class: Clean

## 2011-04-16 MED ORDER — TETRACAINE HCL 0.5 % OP SOLN
OPHTHALMIC | Status: AC
  Administered 2011-04-16: 1 [drp] via OPHTHALMIC
  Filled 2011-04-16: qty 2

## 2011-04-16 MED ORDER — LIDOCAINE HCL (PF) 1 % IJ SOLN
INTRAMUSCULAR | Status: DC | PRN
  Administered 2011-04-16: .3 mL

## 2011-04-16 MED ORDER — TETRACAINE HCL 0.5 % OP SOLN
1.0000 [drp] | OPHTHALMIC | Status: AC
  Administered 2011-04-16 (×3): 1 [drp] via OPHTHALMIC

## 2011-04-16 MED ORDER — MIDAZOLAM HCL 2 MG/2ML IJ SOLN
INTRAMUSCULAR | Status: AC
  Administered 2011-04-16: 2 mg via INTRAVENOUS
  Filled 2011-04-16: qty 2

## 2011-04-16 MED ORDER — EPINEPHRINE HCL 1 MG/ML IJ SOLN
INTRAMUSCULAR | Status: AC
  Filled 2011-04-16: qty 1

## 2011-04-16 MED ORDER — LIDOCAINE HCL 3.5 % OP GEL
OPHTHALMIC | Status: AC
  Administered 2011-04-16: 1 via OPHTHALMIC
  Filled 2011-04-16: qty 5

## 2011-04-16 MED ORDER — MIDAZOLAM HCL 2 MG/2ML IJ SOLN
1.0000 mg | INTRAMUSCULAR | Status: DC | PRN
  Administered 2011-04-16: 2 mg via INTRAVENOUS

## 2011-04-16 MED ORDER — LIDOCAINE HCL 3.5 % OP GEL
OPHTHALMIC | Status: DC | PRN
  Administered 2011-04-16: 1 via OPHTHALMIC

## 2011-04-16 MED ORDER — CYCLOPENTOLATE-PHENYLEPHRINE 0.2-1 % OP SOLN
1.0000 [drp] | OPHTHALMIC | Status: AC
  Administered 2011-04-16 (×3): 1 [drp] via OPHTHALMIC

## 2011-04-16 MED ORDER — CYCLOPENTOLATE-PHENYLEPHRINE 0.2-1 % OP SOLN
OPHTHALMIC | Status: AC
  Administered 2011-04-16: 1 [drp] via OPHTHALMIC
  Filled 2011-04-16: qty 2

## 2011-04-16 MED ORDER — LACTATED RINGERS IV SOLN: INTRAVENOUS | Status: DC

## 2011-04-16 MED ORDER — POVIDONE-IODINE 5 % OP SOLN
OPHTHALMIC | Status: DC | PRN
  Administered 2011-04-16: 1 via OPHTHALMIC

## 2011-04-16 MED ORDER — PHENYLEPHRINE HCL 2.5 % OP SOLN
OPHTHALMIC | Status: AC
  Administered 2011-04-16: 1 [drp] via OPHTHALMIC
  Filled 2011-04-16: qty 2

## 2011-04-16 MED ORDER — NEOMYCIN-POLYMYXIN-DEXAMETH 0.1 % OP OINT
TOPICAL_OINTMENT | OPHTHALMIC | Status: DC | PRN
  Administered 2011-04-16: 1 via OPHTHALMIC

## 2011-04-16 MED ORDER — LACTATED RINGERS IV SOLN
INTRAVENOUS | Status: DC | PRN
  Administered 2011-04-16: 11:00:00 via INTRAVENOUS

## 2011-04-16 MED ORDER — BSS IO SOLN
INTRAOCULAR | Status: DC | PRN
  Administered 2011-04-16: 15 mL via OPHTHALMIC

## 2011-04-16 MED ORDER — PHENYLEPHRINE HCL 2.5 % OP SOLN
1.0000 [drp] | OPHTHALMIC | Status: AC
  Administered 2011-04-16 (×3): 1 [drp] via OPHTHALMIC

## 2011-04-16 MED ORDER — BSS IO SOLN
INTRAOCULAR | Status: DC | PRN
  Administered 2011-04-16: 12:00:00

## 2011-04-16 MED ORDER — LIDOCAINE HCL 3.5 % OP GEL
1.0000 "application " | Freq: Once | OPHTHALMIC | Status: AC
  Administered 2011-04-16: 1 via OPHTHALMIC

## 2011-04-16 MED ORDER — LIDOCAINE HCL (PF) 1 % IJ SOLN
INTRAMUSCULAR | Status: AC
  Filled 2011-04-16: qty 2

## 2011-04-16 MED ORDER — PROVISC 10 MG/ML IO SOLN
INTRAOCULAR | Status: DC | PRN
  Administered 2011-04-16: 8.5 mg via OPHTHALMIC

## 2011-04-16 MED ORDER — LACTATED RINGERS IV SOLN
INTRAVENOUS | Status: DC
  Administered 2011-04-16: 1000 mL via INTRAVENOUS

## 2011-04-16 MED ORDER — NEOMYCIN-POLYMYXIN-DEXAMETH 3.5-10000-0.1 OP OINT
TOPICAL_OINTMENT | OPHTHALMIC | Status: AC
  Filled 2011-04-16: qty 3.5

## 2011-04-16 SURGICAL SUPPLY — 34 items
CAPSULAR TENSION RING-AMO (OPHTHALMIC RELATED) IMPLANT
CLOTH BEACON ORANGE TIMEOUT ST (SAFETY) ×2 IMPLANT
DUOVISC SYSTEM (INTRAOCULAR LENS)
EYE SHIELD UNIVERSAL CLEAR (GAUZE/BANDAGES/DRESSINGS) ×2 IMPLANT
GLOVE BIO SURGEON STRL SZ 6.5 (GLOVE) IMPLANT
GLOVE BIOGEL PI IND STRL 6.5 (GLOVE) ×1 IMPLANT
GLOVE BIOGEL PI IND STRL 7.0 (GLOVE) IMPLANT
GLOVE BIOGEL PI IND STRL 7.5 (GLOVE) IMPLANT
GLOVE BIOGEL PI INDICATOR 6.5 (GLOVE) ×1
GLOVE BIOGEL PI INDICATOR 7.0 (GLOVE)
GLOVE BIOGEL PI INDICATOR 7.5 (GLOVE)
GLOVE ECLIPSE 6.5 STRL STRAW (GLOVE) IMPLANT
GLOVE ECLIPSE 7.0 STRL STRAW (GLOVE) IMPLANT
GLOVE ECLIPSE 7.5 STRL STRAW (GLOVE) IMPLANT
GLOVE EXAM NITRILE LRG STRL (GLOVE) IMPLANT
GLOVE EXAM NITRILE MD LF STRL (GLOVE) ×2 IMPLANT
GLOVE SKINSENSE NS SZ6.5 (GLOVE)
GLOVE SKINSENSE NS SZ7.0 (GLOVE)
GLOVE SKINSENSE STRL SZ6.5 (GLOVE) IMPLANT
GLOVE SKINSENSE STRL SZ7.0 (GLOVE) IMPLANT
KIT VITRECTOMY (OPHTHALMIC RELATED) IMPLANT
PAD ARMBOARD 7.5X6 YLW CONV (MISCELLANEOUS) ×2 IMPLANT
PROC W NO LENS (INTRAOCULAR LENS)
PROC W SPEC LENS (INTRAOCULAR LENS)
PROCESS W NO LENS (INTRAOCULAR LENS) IMPLANT
PROCESS W SPEC LENS (INTRAOCULAR LENS) IMPLANT
RING MALYGIN (MISCELLANEOUS) IMPLANT
SIGHTPATH CAT PROC W REG LENS (Ophthalmic Related) ×2 IMPLANT
SYR TB 1ML LL NO SAFETY (SYRINGE) ×2 IMPLANT
SYSTEM DUOVISC (INTRAOCULAR LENS) IMPLANT
TAPE SURG TRANSPORE 1 IN (GAUZE/BANDAGES/DRESSINGS) ×1 IMPLANT
TAPE SURGICAL TRANSPORE 1 IN (GAUZE/BANDAGES/DRESSINGS) ×1
VISCOELASTIC ADDITIONAL (OPHTHALMIC RELATED) IMPLANT
WATER STERILE IRR 250ML POUR (IV SOLUTION) ×2 IMPLANT

## 2011-04-16 NOTE — H&P (Signed)
I have evaluated the patient preoperatively, and have identified no interval changes in medical condition and plan of care since the history and physical of record 

## 2011-04-16 NOTE — Op Note (Signed)
NAMEQUOC, TOME NO.:  [id number]  MEDICAL RECORD NO.:  [id number]  LOCATION:  [facility]                          FACILITY:  [facility]  PHYSICIAN:  Susanne Greenhouse, MD       DATE OF BIRTH:  07/18/1934  DATE OF PROCEDURE:  04/16/2011 DATE OF DISCHARGE:  04/16/2011                              OPERATIVE REPORT   POSTOPERATIVE DIAGNOSIS:  Nuclear cataract left eye, diagnosis code 366.16.  POSTOPERATIVE DIAGNOSIS:  Nuclear cataract left eye, diagnosis code 366.16.  OPERATION PERFORMED:  Phacoemulsification with posterior chamber intraocular lens implantation, left eye.  SURGEON:  Bonne Dolores. Shelah Heatley, MD  ANESTHESIA:  General endotracheal anesthesia.  SPECIMENS:  None.  OPERATIVE SUMMARY:  In the preoperative area, dilating drops were placed into the left eye.  The patient was then brought into the operating room where he was placed under general anesthesia.  The eye was then prepped and draped.  Beginning with a 75 blade, a paracentesis port was made at the surgeon's 2 o'clock position.  The anterior chamber was then filled with a 1% nonpreserved lidocaine solution with epinephrine.  This was followed by Viscoat to deepen the chamber.  A small fornix-based peritomy was performed superiorly.  Next, a single iris hook was placed through the limbus superiorly.  A 2.4-mm keratome blade was then used to make a clear corneal incision over the iris hook.  A bent cystotome needle and Utrata forceps were used to create a continuous tear capsulotomy.  Hydrodissection was performed using balanced salt solution on a fine cannula.  The lens nucleus was then removed using phacoemulsification in a quadrant cracking technique.  The cortical material was then removed with irrigation and aspiration.  The capsular bag and anterior chamber were refilled with Provisc.  The wound was widened to approximately 3 mm and a posterior chamber intraocular lens was placed into the capsular bag  without difficulty using an [employer] lens injecting system.  A single 10-0 nylon suture was then used to close the incision as well as stromal hydration.  The Provisc was removed from the anterior chamber and capsular bag with irrigation and aspiration.  At this point, the wounds were tested for leak, which were negative.  The anterior chamber remained deep and stable.  The patient tolerated the procedure well.  There were no operative complications, and he awoke from general anesthesia without problem.  No surgical specimens.  Prosthetic device used is a Lenstec posterior chamber intraocular lens, model SofTec HD, power of 20, serial number is [id number].          ______________________________ Susanne Greenhouse, MD     KEH/MEDQ  D:  04/16/2011  T:  04/16/2011  Job:  [id number]

## 2011-04-16 NOTE — Brief Op Note (Signed)
04/16/2011  12:37 PM  PATIENT:  Darius Baxter  77 y.o. male  PRE-OPERATIVE DIAGNOSIS:  nuclear cataract left eye  POST-OPERATIVE DIAGNOSIS:  nuclear cataract left eye  PROCEDURE:  Procedure(s): CATARACT EXTRACTION PHACO AND INTRAOCULAR LENS PLACEMENT (IOC)  SURGEON:  Surgeon(s): Gemma Payor  ANESTHESIA:   local and IV sedation   SPECIMEN:  No Specimen  COMPLICATIONS: None

## 2011-04-16 NOTE — Transfer of Care (Signed)
  Anesthesia Post-op Note  Patient: Darius Baxter  Procedure(s) Performed:  CATARACT EXTRACTION PHACO AND INTRAOCULAR LENS PLACEMENT (IOC) - CDE 21.13  Patient Location: PACU and Short Stay  Anesthesia Type: MAC  Level of Consciousness: awake, alert , oriented and patient cooperative  Airway and Oxygen Therapy: Patient Spontanous Breathing  Post-op Pain: none  Post-op Assessment: Post-op Vital signs reviewed, Patient's Cardiovascular Status Stable, Respiratory Function Stable and No signs of Nausea or vomiting  Post-op Vital Signs: Reviewed and stable  Complications: No apparent anesthesia complications

## 2011-04-16 NOTE — Anesthesia Postprocedure Evaluation (Signed)
Anesthesia Post Note  Patient: Darius Baxter  Procedure(s) Performed:  CATARACT EXTRACTION PHACO AND INTRAOCULAR LENS PLACEMENT (IOC) - CDE 21.13  Anesthesia type: MAC  Patient location: Short Stay  Post pain: Pain level controlled  Post assessment: Post-op Vital signs reviewed, Patient's Cardiovascular Status Stable, Respiratory Function Stable and No signs of Nausea or vomiting  Last Vitals:  Filed Vitals:   04/16/11 1240  BP: 158/82  Pulse: 53  Temp: 97.2 F (36.2 C)  Resp: 18    Post vital signs: Reviewed and stable  Level of consciousness: awake, alert , oriented and patient cooperative  Complications: No apparent anesthesia complications

## 2011-04-16 NOTE — Anesthesia Preprocedure Evaluation (Signed)
Anesthesia Evaluation  Name, MR# and DOB Patient awake  General Assessment Comment  Reviewed: Allergy & Precautions, H&P , NPO status , Patient's Chart, lab work & pertinent test results  History of Anesthesia Complications Negative for: history of anesthetic complications  Airway Mallampati: II      Dental  (+) Edentulous Upper   Pulmonary  clear to auscultation  breath sounds clear to auscultation none    Cardiovascular hypertension, Pt. on medications + dysrhythmias Irregular Normal    Neuro/Psych   Seizures -, Well Controlled,     GI/Hepatic/Renal   Endo/Other    Abdominal   Musculoskeletal   Hematology   Peds  Reproductive/Obstetrics    Anesthesia Other Findings             Anesthesia Physical Anesthesia Plan  ASA: III  Anesthesia Plan: MAC   Post-op Pain Management:    Induction:   Airway Management Planned: Nasal Cannula  Additional Equipment:   Intra-op Plan:   Post-operative Plan:   Informed Consent: I have reviewed the patients History and Physical, chart, labs and discussed the procedure including the risks, benefits and alternatives for the proposed anesthesia with the patient or authorized representative who has indicated his/her understanding and acceptance.     Plan Discussed with:   Anesthesia Plan Comments:         Anesthesia Quick Evaluation

## 2011-04-20 ENCOUNTER — Encounter (HOSPITAL_COMMUNITY): Payer: Self-pay | Admitting: Ophthalmology

## 2011-10-05 SURGERY — LEFT HEART CATHETERIZATION WITH CORONARY ANGIOGRAM
Anesthesia: LOCAL

## 2011-10-05 MED ORDER — LIDOCAINE HCL (PF) 1 % IJ SOLN
INTRAMUSCULAR | Status: AC
  Filled 2011-10-05: qty 30

## 2011-10-05 MED ORDER — HEPARIN SODIUM (PORCINE) 1000 UNIT/ML IJ SOLN
INTRAMUSCULAR | Status: AC
  Filled 2011-10-05: qty 1

## 2011-10-05 MED ORDER — VERAPAMIL HCL 2.5 MG/ML IV SOLN
INTRAVENOUS | Status: AC
  Filled 2011-10-05: qty 2

## 2011-10-05 MED ORDER — NITROGLYCERIN 0.2 MG/ML ON CALL CATH LAB
INTRAVENOUS | Status: AC
  Filled 2011-10-05: qty 1

## 2011-10-05 MED ORDER — HEPARIN (PORCINE) IN NACL 2-0.9 UNIT/ML-% IJ SOLN
INTRAMUSCULAR | Status: AC
  Filled 2011-10-05: qty 2000

## 2011-10-06 ENCOUNTER — Encounter (HOSPITAL_COMMUNITY): Payer: Self-pay | Admitting: *Deleted

## 2011-10-06 ENCOUNTER — Inpatient Hospital Stay (HOSPITAL_COMMUNITY): Payer: Medicare Other

## 2011-10-06 ENCOUNTER — Other Ambulatory Visit: Payer: Self-pay

## 2011-10-06 ENCOUNTER — Encounter (HOSPITAL_COMMUNITY)
Admission: EM | Disposition: A | Discharge status: Home / Self Care | Payer: Self-pay | Source: Other Acute Inpatient Hospital | Attending: Cardiology

## 2011-10-06 ENCOUNTER — Inpatient Hospital Stay (HOSPITAL_COMMUNITY)
Admission: EM | Admit: 2011-10-06 | Discharge: 2011-10-10 | DRG: 281 | Disposition: A | Discharge status: Home / Self Care | Payer: Medicare Other | Source: Other Acute Inpatient Hospital | Attending: Cardiology | Admitting: Cardiology

## 2011-10-06 DIAGNOSIS — I129 Hypertensive chronic kidney disease with stage 1 through stage 4 chronic kidney disease, or unspecified chronic kidney disease: Secondary | ICD-10-CM | POA: Diagnosis present

## 2011-10-06 DIAGNOSIS — I2119 ST elevation (STEMI) myocardial infarction involving other coronary artery of inferior wall: Secondary | ICD-10-CM | POA: Diagnosis present

## 2011-10-06 DIAGNOSIS — R109 Unspecified abdominal pain: Secondary | ICD-10-CM

## 2011-10-06 DIAGNOSIS — I2510 Atherosclerotic heart disease of native coronary artery without angina pectoris: Secondary | ICD-10-CM

## 2011-10-06 DIAGNOSIS — I5181 Takotsubo syndrome: Principal | ICD-10-CM | POA: Diagnosis present

## 2011-10-06 DIAGNOSIS — M549 Dorsalgia, unspecified: Secondary | ICD-10-CM | POA: Diagnosis present

## 2011-10-06 DIAGNOSIS — R079 Chest pain, unspecified: Secondary | ICD-10-CM

## 2011-10-06 DIAGNOSIS — N289 Disorder of kidney and ureter, unspecified: Secondary | ICD-10-CM

## 2011-10-06 DIAGNOSIS — I38 Endocarditis, valve unspecified: Secondary | ICD-10-CM

## 2011-10-06 DIAGNOSIS — K8020 Calculus of gallbladder without cholecystitis without obstruction: Secondary | ICD-10-CM

## 2011-10-06 DIAGNOSIS — E785 Hyperlipidemia, unspecified: Secondary | ICD-10-CM | POA: Diagnosis present

## 2011-10-06 DIAGNOSIS — K819 Cholecystitis, unspecified: Secondary | ICD-10-CM

## 2011-10-06 DIAGNOSIS — I369 Nonrheumatic tricuspid valve disorder, unspecified: Secondary | ICD-10-CM

## 2011-10-06 DIAGNOSIS — I10 Essential (primary) hypertension: Secondary | ICD-10-CM | POA: Insufficient documentation

## 2011-10-06 DIAGNOSIS — E782 Mixed hyperlipidemia: Secondary | ICD-10-CM

## 2011-10-06 DIAGNOSIS — I213 ST elevation (STEMI) myocardial infarction of unspecified site: Secondary | ICD-10-CM

## 2011-10-06 DIAGNOSIS — K8000 Calculus of gallbladder with acute cholecystitis without obstruction: Secondary | ICD-10-CM | POA: Diagnosis present

## 2011-10-06 DIAGNOSIS — N189 Chronic kidney disease, unspecified: Secondary | ICD-10-CM | POA: Diagnosis present

## 2011-10-06 LAB — MRSA PCR SCREENING: MRSA by PCR: NEGATIVE

## 2011-10-06 LAB — COMPREHENSIVE METABOLIC PANEL
AST: 19 U/L (ref 0–37)
Albumin: 3.6 g/dL (ref 3.5–5.2)
Calcium: 9.8 mg/dL (ref 8.4–10.5)
Chloride: 104 mEq/L (ref 96–112)
Creatinine, Ser: 1.13 mg/dL (ref 0.50–1.35)
Total Bilirubin: 0.3 mg/dL (ref 0.3–1.2)
Total Protein: 6.4 g/dL (ref 6.0–8.3)

## 2011-10-06 LAB — CARDIAC PANEL(CRET KIN+CKTOT+MB+TROPI)
Relative Index: 9.5 — ABNORMAL HIGH (ref 0.0–2.5)
Relative Index: 9.8 — ABNORMAL HIGH (ref 0.0–2.5)
Relative Index: INVALID (ref 0.0–2.5)
Total CK: 115 U/L (ref 7–232)
Total CK: 102 U/L (ref 7–232)

## 2011-10-06 LAB — LIPID PANEL
HDL: 40 mg/dL (ref >39)
LDL Cholesterol: 41 mg/dL (ref 0–99)
Total CHOL/HDL Ratio: 3.6 RATIO
Triglycerides: 310 mg/dL — ABNORMAL HIGH (ref <150)
VLDL: 62 mg/dL — ABNORMAL HIGH (ref 0–40)

## 2011-10-06 LAB — LIPASE, BLOOD: Lipase: 23 U/L (ref 11–59)

## 2011-10-06 LAB — CBC
MCH: 30.9 pg (ref 26.0–34.0)
MCHC: 34.7 g/dL (ref 30.0–36.0)
Platelets: 128 10*3/uL — ABNORMAL LOW (ref 150–400)
RDW: 13.5 % (ref 11.5–15.5)

## 2011-10-06 LAB — TSH: TSH: 1.751 u[IU]/mL (ref 0.350–4.500)

## 2011-10-06 LAB — PROTIME-INR: INR: 1.01 (ref 0.00–1.49)

## 2011-10-06 LAB — APTT: aPTT: 34 seconds (ref 24–37)

## 2011-10-06 SURGERY — LEFT HEART CATHETERIZATION WITH CORONARY ANGIOGRAM
Anesthesia: LOCAL

## 2011-10-06 MED ORDER — ADULT MULTIVITAMIN W/MINERALS CH
1.0000 | ORAL_TABLET | ORAL | Status: DC
  Administered 2011-10-06 – 2011-10-08 (×3): 1 via ORAL
  Filled 2011-10-06 (×5): qty 1

## 2011-10-06 MED ORDER — ROSUVASTATIN CALCIUM 10 MG PO TABS
10.0000 mg | ORAL_TABLET | Freq: Every day | ORAL | Status: DC
  Administered 2011-10-06 – 2011-10-10 (×5): 10 mg via ORAL
  Filled 2011-10-06 (×5): qty 1

## 2011-10-06 MED ORDER — OXYCODONE-ACETAMINOPHEN 5-325 MG PO TABS
1.0000 | ORAL_TABLET | ORAL | Status: DC | PRN
  Administered 2011-10-06 (×2): 2 via ORAL
  Filled 2011-10-06 (×2): qty 2

## 2011-10-06 MED ORDER — NITROGLYCERIN IN D5W 200-5 MCG/ML-% IV SOLN
2.0000 ug/min | INTRAVENOUS | Status: DC
Start: 2011-10-06 — End: 2011-10-06

## 2011-10-06 MED ORDER — NITROGLYCERIN 0.4 MG SL SUBL: 0.4000 mg | SUBLINGUAL_TABLET | SUBLINGUAL | Status: DC | PRN

## 2011-10-06 MED ORDER — MORPHINE SULFATE 2 MG/ML IJ SOLN
2.0000 mg | INTRAMUSCULAR | Status: DC | PRN
  Administered 2011-10-06 (×2): 2 mg via INTRAVENOUS
  Filled 2011-10-06 (×2): qty 1

## 2011-10-06 MED ORDER — FUROSEMIDE 20 MG PO TABS
20.0000 mg | ORAL_TABLET | Freq: Every day | ORAL | Status: DC
  Filled 2011-10-06: qty 1

## 2011-10-06 MED ORDER — ROSUVASTATIN CALCIUM 20 MG PO TABS
20.0000 mg | ORAL_TABLET | Freq: Every day | ORAL | Status: DC
  Filled 2011-10-06: qty 1

## 2011-10-06 MED ORDER — ASPIRIN EC 81 MG PO TBEC
81.0000 mg | DELAYED_RELEASE_TABLET | Freq: Every day | ORAL | Status: DC
  Administered 2011-10-07 – 2011-10-10 (×4): 81 mg via ORAL
  Filled 2011-10-06 (×4): qty 1

## 2011-10-06 MED ORDER — BROMFENAC SODIUM (ONCE-DAILY) 0.09 % OP SOLN: 2.0000 [drp] | Freq: Two times a day (BID) | OPHTHALMIC | Status: DC

## 2011-10-06 MED ORDER — OLMESARTAN MEDOXOMIL 20 MG PO TABS
20.0000 mg | ORAL_TABLET | Freq: Every day | ORAL | Status: DC
  Administered 2011-10-06: 20 mg via ORAL
  Filled 2011-10-06: qty 1

## 2011-10-06 MED ORDER — ONDANSETRON HCL 4 MG/2ML IJ SOLN: 4.0000 mg | Freq: Four times a day (QID) | INTRAMUSCULAR | Status: DC | PRN

## 2011-10-06 MED ORDER — ACETAMINOPHEN 325 MG PO TABS: 650.0000 mg | ORAL_TABLET | ORAL | Status: DC | PRN

## 2011-10-06 MED ORDER — ENOXAPARIN SODIUM 40 MG/0.4ML ~~LOC~~ SOLN
40.0000 mg | Freq: Every day | SUBCUTANEOUS | Status: DC
  Administered 2011-10-06: 40 mg via SUBCUTANEOUS
  Filled 2011-10-06: qty 0.4

## 2011-10-06 MED ORDER — ONDANSETRON HCL 4 MG/2ML IJ SOLN
4.0000 mg | Freq: Four times a day (QID) | INTRAMUSCULAR | Status: DC | PRN
  Administered 2011-10-07: 4 mg via INTRAVENOUS
  Filled 2011-10-06: qty 2

## 2011-10-06 MED ORDER — HYDROMORPHONE HCL PF 1 MG/ML IJ SOLN
1.0000 mg | INTRAMUSCULAR | Status: DC | PRN
  Administered 2011-10-06 – 2011-10-07 (×4): 1 mg via INTRAVENOUS
  Filled 2011-10-06 (×4): qty 1

## 2011-10-06 MED ORDER — SODIUM CHLORIDE 0.9 % IV SOLN: INTRAVENOUS | Status: DC

## 2011-10-06 MED ORDER — ENOXAPARIN SODIUM 40 MG/0.4ML ~~LOC~~ SOLN
40.0000 mg | SUBCUTANEOUS | Status: DC
  Filled 2011-10-06: qty 0.4

## 2011-10-06 MED ORDER — TRAVOPROST (BAK FREE) 0.004 % OP SOLN
1.0000 [drp] | Freq: Every day | OPHTHALMIC | Status: DC
Start: 2011-10-06 — End: 2011-10-06
  Filled 2011-10-06: qty 2.5

## 2011-10-06 MED ORDER — HYDROCHLOROTHIAZIDE 12.5 MG PO CAPS
12.5000 mg | ORAL_CAPSULE | Freq: Every day | ORAL | Status: DC
  Administered 2011-10-06: 12.5 mg via ORAL
  Filled 2011-10-06: qty 1

## 2011-10-06 MED ORDER — KETOROLAC TROMETHAMINE 0.5 % OP SOLN
1.0000 [drp] | Freq: Two times a day (BID) | OPHTHALMIC | Status: DC
  Filled 2011-10-06: qty 3

## 2011-10-06 MED ORDER — HEPARIN SOD (PORCINE) IN D5W 100 UNIT/ML IV SOLN
1200.0000 [IU]/h | INTRAVENOUS | Status: DC
  Filled 2011-10-06: qty 250

## 2011-10-06 MED ORDER — NITROGLYCERIN IN D5W 200-5 MCG/ML-% IV SOLN: 2.0000 ug/min | INTRAVENOUS | Status: DC

## 2011-10-06 MED ORDER — ALPRAZOLAM 0.25 MG PO TABS
0.2500 mg | ORAL_TABLET | ORAL | Status: DC | PRN
  Administered 2011-10-06 – 2011-10-09 (×3): 0.25 mg via ORAL
  Filled 2011-10-06 (×3): qty 1

## 2011-10-06 MED ORDER — ADULT MULTIVITAMIN W/MINERALS CH
1.0000 | ORAL_TABLET | ORAL | Status: DC
  Filled 2011-10-06 (×2): qty 1

## 2011-10-06 MED ORDER — SODIUM CHLORIDE 0.9 % IV SOLN
INTRAVENOUS | Status: DC
  Administered 2011-10-06: 20:00:00 via INTRAVENOUS

## 2011-10-06 MED ORDER — IOHEXOL 350 MG/ML SOLN
100.0000 mL | Freq: Once | INTRAVENOUS | Status: AC | PRN
  Administered 2011-10-06: 100 mL via INTRAVENOUS

## 2011-10-06 MED ORDER — LISINOPRIL 20 MG PO TABS
20.0000 mg | ORAL_TABLET | Freq: Every day | ORAL | Status: DC
  Administered 2011-10-06 – 2011-10-10 (×4): 20 mg via ORAL
  Filled 2011-10-06 (×5): qty 1

## 2011-10-06 MED ORDER — DIFLUPREDNATE 0.05 % OP EMUL: 2.0000 [drp] | Freq: Two times a day (BID) | OPHTHALMIC | Status: DC

## 2011-10-06 MED ORDER — PREDNISOLONE ACETATE 1 % OP SUSP
2.0000 [drp] | Freq: Four times a day (QID) | OPHTHALMIC | Status: DC
  Filled 2011-10-06: qty 1

## 2011-10-06 MED ORDER — FUROSEMIDE 20 MG PO TABS
20.0000 mg | ORAL_TABLET | Freq: Two times a day (BID) | ORAL | Status: DC
  Administered 2011-10-06 – 2011-10-10 (×8): 20 mg via ORAL
  Filled 2011-10-06 (×11): qty 1

## 2011-10-06 MED ORDER — OLMESARTAN MEDOXOMIL-HCTZ 20-12.5 MG PO TABS: 1.0000 | ORAL_TABLET | Freq: Every day | ORAL | Status: DC

## 2011-10-06 MED ORDER — CIPROFLOXACIN IN D5W 400 MG/200ML IV SOLN
400.0000 mg | Freq: Two times a day (BID) | INTRAVENOUS | Status: DC
  Administered 2011-10-06 – 2011-10-09 (×6): 400 mg via INTRAVENOUS
  Filled 2011-10-06 (×8): qty 200

## 2011-10-06 MED ORDER — NITROGLYCERIN IN D5W 200-5 MCG/ML-% IV SOLN
INTRAVENOUS | Status: AC
  Filled 2011-10-06: qty 250

## 2011-10-06 MED ORDER — CARVEDILOL 3.125 MG PO TABS
3.1250 mg | ORAL_TABLET | Freq: Two times a day (BID) | ORAL | Status: DC
  Administered 2011-10-06 – 2011-10-09 (×6): 3.125 mg via ORAL
  Filled 2011-10-06 (×9): qty 1

## 2011-10-06 MED ORDER — ASPIRIN EC 81 MG PO TBEC
81.0000 mg | DELAYED_RELEASE_TABLET | Freq: Every day | ORAL | Status: DC
  Filled 2011-10-06: qty 1

## 2011-10-06 NOTE — Op Note (Signed)
   Cardiac Catheterization Procedure Note  Name: Darius Baxter MRN: [id number] DOB: [birth year]  Procedure: Left Heart Cath, Selective Coronary Angiography, LV angiography  Indication: 78 year old white male transferred from [facility] in [place]. He presented there with acute onset of mid infrascapular pain radiating to his chest. ECG showed 1 mm of ST elevation in leads V1 and V2 with 1/2 mm ST elevation in lead aVL. There was ST depression in the inferior leads. He has a history of hypertension and hyperlipidemia.   Procedural Details: The right wrist was prepped, draped, and anesthetized with 1% lidocaine. Using the modified Seldinger technique, a 6 French sheath was introduced into the right radial artery. 3 mg of verapamil was administered through the sheath, weight-based unfractionated heparin was administered intravenously. Standard Judkins catheters were used for selective coronary angiography and left ventriculography. Catheter exchanges were performed over an exchange length guidewire. There were no immediate procedural complications. A TR band was used for radial hemostasis at the completion of the procedure.  The patient was transferred to the post catheterization recovery area for further monitoring.  Procedural Findings: Hemodynamics: AO 108/75 with a mean of 90 mmHg LV 104/34 mmHg  Coronary angiography: Coronary dominance: right  Left mainstem: Normal  Left anterior descending (LAD): 20-30% irregularities in the proximal vessel. This is a large vessel which extends around the apex. No obstructive disease is noted.  There is a large ramus intermediate branch which has 20% narrowing at the ostium.  Left circumflex (LCx): 20-30% narrowing at the ostium.  Right coronary artery (RCA): Normal.  Left ventriculography: Left ventricular size is increased. There is severe hypokinesis to akinesis of the mid to distal anterior wall, apex, and mid to distal inferior wall with  overall moderately severe LV dysfunction. Ejection fraction is estimated at 35%.  Final Conclusions:   1. Nonobstructive coronary disease. 2. Moderate to severe left ventricular dysfunction with characteristic appearance of Takotsubo cardiomyopathy.  Recommendations: Medical management.  Naveen Lorusso [place] 10/06/2011, 12:23 AM

## 2011-10-06 NOTE — Consult Note (Signed)
Reason for Consult:Abdominal pain gallstones Referring Physician: [name] is an 78 y.o. male.  HPI:  Asked to see pt At the request of Dr Darius Baxter due to severe RUQ pain that radiates to his midback.  CT angio of abdomen and chest obtained to exclude dissection and shows dilated gallbladder and possible stone.   Pain began last night.   History of intermittent RUQ pain according to his wife.  10/10 sharp in nature radiates to back.  Unclear if food makes it worse.  On cardiology service due to MI.  Past Medical History  Diagnosis Date  . HTN (hypertension)   . HLD (hyperlipidemia)   . Ventricular ectopy     bigeminy  . Valvular heart disease     mild MVP/MR by 2-D echo, 8/10  . Seizures     had 1 seizure at age 72,unknown orgin.no more since and on no meds  . Chronic kidney disease     had right kidney removed for cancer-5 yrs ago  . Cancer     right kidney  . Arthritis   . Chronic back pain     due to arthritis    Past Surgical History  Procedure Date  . Nephrectomy     right nephrectomy for cancer  . Appendectomy   . Knee arthroscopy     right knee  . Right total knee 3 yrs ago    right 3 yrs ago at [facility]  . Cataract extraction w/phaco 03/22/2011    Procedure: CATARACT EXTRACTION PHACO AND INTRAOCULAR LENS PLACEMENT (IOC);  Surgeon: Gemma Payor;  Location: [facility];  Service: Ophthalmology;  Laterality: Right;  CDE  19.88  . Cataract extraction w/phaco 04/16/2011    Procedure: CATARACT EXTRACTION PHACO AND INTRAOCULAR LENS PLACEMENT (IOC);  Surgeon: Gemma Payor;  Location: [facility];  Service: Ophthalmology;  Laterality: Left;  CDE 21.13    Family History  Problem Relation Age of Onset  . Cancer Other   . Stroke Other   . Coronary artery disease Other   . Hypertension Other   . Anesthesia problems Neg Hx   . Hypotension Neg Hx   . Malignant hyperthermia Neg Hx   . Pseudochol deficiency Neg Hx     Social History:  reports that he has never smoked. He does  not have any smokeless tobacco history on file. He reports that he does not drink alcohol or use illicit drugs.  Allergies:  Allergies  Allergen Reactions  . Aleve ([employer] Naproxen Sodium)     Patient has only 1 kidney and Doctor  Doesn't want him to take    Medications:  Prior to Admission:  Prescriptions prior to admission  Medication Sig Dispense Refill  . aspirin 81 MG EC tablet Take 81 mg by mouth daily.        Marland Kitchen atorvastatin (LIPITOR) 40 MG tablet Take 20 mg by mouth every evening.      . felodipine (PLENDIL) 2.5 MG 24 hr tablet Take 2.5 mg by mouth daily.       . furosemide (LASIX) 20 MG tablet Take 20 mg by mouth daily.       . hydrocodone-acetaminophen (LORCET-HD) 5-500 MG per capsule Take 1 capsule by mouth 2 (two) times a week. Patient usually takes on Wednesday and Sunday mornings.       . Multiple Vitamin (MULITIVITAMIN WITH MINERALS) TABS Take 1 tablet by mouth daily. Take 1 tablet on Sunday,Wednesday, Thursday, & Friday      . olmesartan-hydrochlorothiazide (  BENICAR HCT) 20-12.5 MG per tablet Take 1 tablet by mouth every evening.      . quinapril (ACCUPRIL) 40 MG tablet Take 40 mg by mouth every morning.        Scheduled:   . aspirin EC  81 mg Oral Daily  . carvedilol  3.125 mg Oral BID WC  . enoxaparin (LOVENOX) injection  40 mg Subcutaneous Daily  . furosemide  20 mg Oral BID  . heparin      . heparin      . lidocaine      . lisinopril  20 mg Oral Daily  . mulitivitamin with minerals  1 tablet Oral BH-q7a  . nitroGLYCERIN      . nitroGLYCERIN      . rosuvastatin  10 mg Oral Daily  . verapamil      . DISCONTD: aspirin EC  81 mg Oral Daily  . DISCONTD: Bromfenac Sodium  2 drop Right Eye BID  . DISCONTD: Difluprednate  2 drop Right Eye BID  . DISCONTD: enoxaparin  40 mg Subcutaneous Q24H  . DISCONTD: furosemide  20 mg Oral Daily  . DISCONTD: hydrochlorothiazide  12.5 mg Oral Daily  . DISCONTD: ketorolac  1 drop Right Eye BID  . DISCONTD: mulitivitamin with  minerals  1 tablet Oral BH-q7a  . DISCONTD: olmesartan  20 mg Oral Daily  . DISCONTD: olmesartan-hydrochlorothiazide  1 tablet Oral Daily  . DISCONTD: prednisoLONE acetate  2 drop Right Eye QID  . DISCONTD: rosuvastatin  20 mg Oral Daily  . DISCONTD: Travoprost (BAK Free)  1 drop Both Eyes QHS   Continuous:   . nitroGLYCERIN 10 mcg/min (10/06/11 1500)  . DISCONTD: sodium chloride 75 mL/hr at 10/06/11 0045  . DISCONTD: heparin    . DISCONTD: nitroGLYCERIN 5 mcg/min (10/06/11 0045)   VHQ:[id number], ALPRAZolam, iohexol, morphine, nitroGLYCERIN, ondansetron (ZOFRAN) IV, oxyCODONE-acetaminophen, DISCONTD: acetaminophen, DISCONTD: ondansetron (ZOFRAN) IV  Results for orders placed during the hospital encounter of 10/06/11 (from the past 48 hour(s))  MRSA PCR SCREENING     Status: Normal   Collection Time   10/06/11 12:45 AM      Component Value Range Comment   MRSA by PCR NEGATIVE  NEGATIVE    CARDIAC PANEL(CRET KIN+CKTOT+MB+TROPI)     Status: Abnormal   Collection Time   10/06/11  2:18 AM      Component Value Range Comment   Total CK 115  7 - 232 (U/L)    CK, MB 10.9 (*) 0.3 - 4.0 (ng/mL)    Troponin I 3.20 (*) <0.30 (ng/mL)    Relative Index 9.5 (*) 0.0 - 2.5    PROTIME-INR     Status: Normal   Collection Time   10/06/11  2:18 AM      Component Value Range Comment   Prothrombin Time 13.5  11.6 - 15.2 (seconds)    INR 1.01  0.00 - 1.49    APTT     Status: Normal   Collection Time   10/06/11  2:18 AM      Component Value Range Comment   aPTT 34  24 - 37 (seconds)   COMPREHENSIVE METABOLIC PANEL     Status: Abnormal   Collection Time   10/06/11  2:18 AM      Component Value Range Comment   Sodium 137  135 - 145 (mEq/L)    Potassium 4.5  3.5 - 5.1 (mEq/L)    Chloride 104  96 - 112 (mEq/L)    CO2 26  19 - 32 (mEq/L)    Glucose, Bld 117 (*) 70 - 99 (mg/dL)    BUN 23  6 - 23 (mg/dL)    Creatinine, Ser 0.45  0.50 - 1.35 (mg/dL)    Calcium 9.8  8.4 - 10.5 (mg/dL)    Total  Protein 6.4  6.0 - 8.3 (g/dL)    Albumin 3.6  3.5 - 5.2 (g/dL)    AST 19  0 - 37 (U/L)    ALT 13  0 - 53 (U/L)    Alkaline Phosphatase 56  39 - 117 (U/L)    Total Bilirubin 0.3  0.3 - 1.2 (mg/dL)    GFR calc non Af Amer 62 (*) >90 (mL/min)    GFR calc Af Amer 71 (*) >90 (mL/min)   CBC     Status: Abnormal   Collection Time   10/06/11  2:18 AM      Component Value Range Comment   WBC 8.6  4.0 - 10.5 (K/uL)    RBC 4.43  4.22 - 5.81 (MIL/uL)    Hemoglobin 13.7  13.0 - 17.0 (g/dL)    HCT 40.9  81.1 - 91.4 (%)    MCV 89.2  78.0 - 100.0 (fL)    MCH 30.9  26.0 - 34.0 (pg)    MCHC 34.7  30.0 - 36.0 (g/dL)    RDW 78.2  95.6 - 21.3 (%)    Platelets 128 (*) 150 - 400 (K/uL)   LIPID PANEL     Status: Abnormal   Collection Time   10/06/11  5:30 AM      Component Value Range Comment   Cholesterol 143  0 - 200 (mg/dL)    Triglycerides 086 (*) <150 (mg/dL)    HDL 40  >57 (mg/dL)    Total CHOL/HDL Ratio 3.6      VLDL 62 (*) 0 - 40 (mg/dL)    LDL Cholesterol 41  0 - 99 (mg/dL)   CARDIAC PANEL(CRET KIN+CKTOT+MB+TROPI)     Status: Abnormal   Collection Time   10/06/11  9:44 AM      Component Value Range Comment   Total CK 102  7 - 232 (U/L)    CK, MB 10.0 (*) 0.3 - 4.0 (ng/mL) CRITICAL VALUE NOTED.  VALUE IS CONSISTENT WITH PREVIOUSLY REPORTED AND CALLED VALUE.   Troponin I 2.68 (*) <0.30 (ng/mL)    Relative Index 9.8 (*) 0.0 - 2.5      Ct Angio Chest W/cm &/or Wo Cm  10/06/2011  *RADIOLOGY REPORT*  Clinical Data:  Chest pain  CT ANGIOGRAPHY CHEST, ABDOMEN AND PELVIS  Technique:  Multidetector CT imaging through the chest, abdomen and pelvis was performed using the standard protocol during bolus administration of intravenous contrast.  Multiplanar reconstructed images including MIPs were obtained and reviewed to evaluate the vascular anatomy.  Contrast: OMNIPAQUE IOHEXOL 350 MG/ML IV SOLN  Comparison:   None.  CTA CHEST  Findings:  The noncontrast scan shows no hyperdense hematoma,  mediastinal fluid, or pericardial effusion.  Patchy coronary and aortic calcifications.  No aortic dissection, aneurysm, or stenosis.  Classic three-vessel brachiocephalic arterial origin anatomy without proximal stenosis.  There is incomplete opacification of the pulmonary arterial tree which is grossly unremarkable; exam was not optimized for detection of pulmonary emboli.  No pleural effusion.  No hilar or mediastinal adenopathy.  There is some dependent atelectasis or scarring posteriorly in the lung bases. Mild spondylitic changes in the lower thoracic spine.   Review of the  MIP images confirms the above findings.  IMPRESSION:  1.  Negative for acute PE or thoracic aortic dissection. 2.  Patchy coronary and aortic calcifications.  CTA ABDOMEN AND PELVIS  Findings: Abdominal aorta:  Patchy calcified plaque without dissection, aneurysm, or stenosis.  Celiac axis:  Widely patent  Superior mesenteric artery:  Widely patent, with classic distal branching  Left renal artery:  There are two, the inferior dominant.  Both are widely patent.  Right renal artery:  Ligated proximally, post nephrectomy.  Inferior mesenteric artery:  Origin stenosis related to aortic wall plaque, patent distally  Bifurcation:  Partially calcified plaque without aneurysm or stenosis.  Left iliac: Mild tortuosity, with mild plaque in the proximal common iliac.  No   dissection, aneurysm, or stenosis. Common femoral artery and visualized portions of the proximal SFA and profunda femoris widely patent.  Right iliac:  Mild tortuosity without dissection, aneurysm, or stenosis. Common femoral artery and visualized portions of the proximal SFA and profunda femoris widely patent.  Venous phase:  Not obtained  Nonvascular findings:  The gallbladder is distended with several noncalcified stones suspected in its dependent portion.  Probable small cyst in the lateral left hepatic segment, incompletely characterized.  Unremarkable spleen, adrenal glands.   Small left renal cyst.  Mild pancreatic atrophy without focal lesion.  Small bowel decompressed.  Multiple descending and sigmoid diverticula without adjacent inflammatory/edematous change.  No ascites. Urinary bladder physiologically distended.  Moderate prostatic enlargement with central coarse calcifications.  Bilateral pelvic phleboliths.  No pelvic, retroperitoneal, or mesenteric adenopathy. The portal vein is patent.  Spondylitic changes in the lower lumbar spine.  Review of the MIP images confirms the above findings.  IMPRESSION:  1.  No abdominal dissection, aneurysm, or significant branch vessel disease. 2.  Distended gallbladder containing stones. 3.  Probable left renal and hepatic cysts, incompletely characterized. 4.  Descending and sigmoid diverticulosis.  Original Report Authenticated By: Osa Craver, M.D.   Ct Angio Abd/pel W/ And/or W/o  10/06/2011  *RADIOLOGY REPORT*  Clinical Data:  Chest pain  CT ANGIOGRAPHY CHEST, ABDOMEN AND PELVIS  Technique:  Multidetector CT imaging through the chest, abdomen and pelvis was performed using the standard protocol during bolus administration of intravenous contrast.  Multiplanar reconstructed images including MIPs were obtained and reviewed to evaluate the vascular anatomy.  Contrast: OMNIPAQUE IOHEXOL 350 MG/ML IV SOLN  Comparison:   None.  CTA CHEST  Findings:  The noncontrast scan shows no hyperdense hematoma, mediastinal fluid, or pericardial effusion.  Patchy coronary and aortic calcifications.  No aortic dissection, aneurysm, or stenosis.  Classic three-vessel brachiocephalic arterial origin anatomy without proximal stenosis.  There is incomplete opacification of the pulmonary arterial tree which is grossly unremarkable; exam was not optimized for detection of pulmonary emboli.  No pleural effusion.  No hilar or mediastinal adenopathy.  There is some dependent atelectasis or scarring posteriorly in the lung bases. Mild spondylitic  changes in the lower thoracic spine.   Review of the MIP images confirms the above findings.  IMPRESSION:  1.  Negative for acute PE or thoracic aortic dissection. 2.  Patchy coronary and aortic calcifications.  CTA ABDOMEN AND PELVIS  Findings: Abdominal aorta:  Patchy calcified plaque without dissection, aneurysm, or stenosis.  Celiac axis:  Widely patent  Superior mesenteric artery:  Widely patent, with classic distal branching  Left renal artery:  There are two, the inferior dominant.  Both are widely patent.  Right renal artery:  Ligated proximally, post nephrectomy.  Inferior mesenteric artery:  Origin stenosis related to aortic wall plaque, patent distally  Bifurcation:  Partially calcified plaque without aneurysm or stenosis.  Left iliac: Mild tortuosity, with mild plaque in the proximal common iliac.  No   dissection, aneurysm, or stenosis. Common femoral artery and visualized portions of the proximal SFA and profunda femoris widely patent.  Right iliac:  Mild tortuosity without dissection, aneurysm, or stenosis. Common femoral artery and visualized portions of the proximal SFA and profunda femoris widely patent.  Venous phase:  Not obtained  Nonvascular findings:  The gallbladder is distended with several noncalcified stones suspected in its dependent portion.  Probable small cyst in the lateral left hepatic segment, incompletely characterized.  Unremarkable spleen, adrenal glands.  Small left renal cyst.  Mild pancreatic atrophy without focal lesion.  Small bowel decompressed.  Multiple descending and sigmoid diverticula without adjacent inflammatory/edematous change.  No ascites. Urinary bladder physiologically distended.  Moderate prostatic enlargement with central coarse calcifications.  Bilateral pelvic phleboliths.  No pelvic, retroperitoneal, or mesenteric adenopathy. The portal vein is patent.  Spondylitic changes in the lower lumbar spine.  Review of the MIP images confirms the above findings.   IMPRESSION:  1.  No abdominal dissection, aneurysm, or significant branch vessel disease. 2.  Distended gallbladder containing stones. 3.  Probable left renal and hepatic cysts, incompletely characterized. 4.  Descending and sigmoid diverticulosis.  Original Report Authenticated By: Osa Craver, M.D.    Review of Systems  Constitutional: Negative for fever and chills.  HENT: Negative.   Respiratory: Negative.   Cardiovascular: Positive for chest pain.  Gastrointestinal: Positive for abdominal pain.  Genitourinary: Negative.   Musculoskeletal: Negative.   Skin: Negative.   Neurological: Negative.   Endo/Heme/Allergies: Negative.   Psychiatric/Behavioral: Negative.    Blood pressure 124/75, pulse 82, temperature 99.9 F (37.7 C), temperature source Oral, resp. rate 18, height 6\' 1"  (1.854 m), weight 220 lb 14.4 oz (100.2 kg), SpO2 95.00%. Physical Exam  Constitutional: He is oriented to person, place, and time. He appears well-developed and well-nourished.  HENT:  Head: Normocephalic and atraumatic.  Eyes: EOM are normal. Pupils are equal, round, and reactive to light.  Neck: Normal range of motion. Neck supple.  Cardiovascular: Normal rate and regular rhythm.   Respiratory: Effort normal and breath sounds normal.  GI: Soft. There is no hepatosplenomegaly. There is tenderness in the right upper quadrant. There is positive Murphy's sign. There is no rebound and no CVA tenderness.  Neurological: He is alert and oriented to person, place, and time. GCS eye subscore is 4. GCS verbal subscore is 5. GCS motor subscore is 6.  Skin: Skin is warm, dry and intact.  Psychiatric: His behavior is normal. Judgment and thought content normal. His mood appears anxious. His speech is slurred. Cognition and memory are normal.    Assessment/Plan: Abdominal pain  With distended gallbladder and exam consistent with acute cholecystitis or biliary colic.  LFT'S and WBC normal.  Will need U/S of GB  and antibiotics.  With recent MI,  May need IR to see for perc drain.  Increase analgesia.  Will follow.  Jaynee Winters A. 10/06/2011, 4:23 PM

## 2011-10-06 NOTE — Progress Notes (Signed)
Pt states the pain is worse than ever--NTG gtt increased to 10 mcg--Xanax and Morphine given--o2 sats great--VSS--enc family to visit in waiting area to allow pt to rest--only 1 visitor at bedside for now. RN will update Dr Patty Sermons.

## 2011-10-06 NOTE — Progress Notes (Signed)
ANTICOAGULATION CONSULT NOTE - Initial Consult  Pharmacy Consult for heparin Indication: chest pain/ACS  Allergies  Allergen Reactions  . Aleve ([facility] Naproxen Sodium)     Patient has only 1 kidney and Doctor  Doesn't want him to take    Patient Measurements: Height: 6\' 1"  (185.4 cm) Weight: 220 lb 14.4 oz (100.2 kg) IBW/kg (Calculated) : 79.9   Vital Signs: Temp: 98 F (36.7 C) (02/16 0043) Temp src: Oral (02/16 0043) BP: 104/75 mmHg (02/16 0043) Pulse Rate: 77  (02/16 0043)  Labs: No results found for this basename: HGB:2,HCT:3,PLT:3,APTT:3,LABPROT:3,INR:3,HEPARINUNFRC:3,CREATININE:3,CKTOTAL:3,CKMB:3,TROPONINI:3 in the last 72 hours Estimated Creatinine Clearance: 60.6 ml/min (by C-G formula based on Cr of 1.31).  Medical History: Past Medical History  Diagnosis Date  . HTN (hypertension)   . HLD (hyperlipidemia)   . Ventricular ectopy     bigeminy  . Valvular heart disease     mild MVP/MR by 2-D echo, 8/10  . Seizures     had 1 seizure at age 23,unknown orgin.no more since and on no meds  . Chronic kidney disease     had right kidney removed for cancer-5 yrs ago  . Cancer     right kidney  . Arthritis   . Chronic back pain     due to arthritis    Medications:  Prescriptions prior to admission  Medication Sig Dispense Refill  . aspirin 81 MG EC tablet Take 81 mg by mouth daily.        Marland Kitchen aspirin 81 MG tablet Take 81 mg by mouth every morning.       Marland Kitchen atorvastatin (LIPITOR) 20 MG tablet Take 20 mg by mouth daily.        Marland Kitchen atorvastatin (LIPITOR) 40 MG tablet Take 20 mg by mouth daily with supper. Take 1/2 tab      . Bromfenac Sodium (BROMDAY) 0.09 % (DAILY) SOLN Place 2 drops into the right eye 2 (two) times daily.        . Difluprednate (DUREZOL) 0.05 % EMUL Place 2 drops into the right eye 2 (two) times daily.        . felodipine (PLENDIL) 2.5 MG 24 hr tablet Take 2.5 mg by mouth daily.       . furosemide (LASIX) 20 MG tablet Take 20 mg by mouth daily.       .  hydrocodone-acetaminophen (LORCET-HD) 5-500 MG per capsule Take 1 capsule by mouth 2 (two) times a week. Patient usually takes on Wednesday and Sunday mornings.       . Multiple Vitamins-Minerals (MULTIVITAL) tablet Take 1 tablet by mouth every morning.       . olmesartan-hydrochlorothiazide (BENICAR HCT) 20-12.5 MG per tablet Take 1 tablet by mouth daily.       . quinapril (ACCUPRIL) 40 MG tablet Take 40 mg by mouth every morning.       . travoprost, benzalkonium, (TRAVATAN Z) 0.004 % ophthalmic solution Place 1 drop into both eyes at bedtime.        Scheduled:    . aspirin EC  81 mg Oral Daily  . Bromfenac Sodium  2 drop Right Eye BID  . Difluprednate  2 drop Right Eye BID  . furosemide  20 mg Oral Daily  . heparin      . heparin      . olmesartan  20 mg Oral Daily   And  . hydrochlorothiazide  12.5 mg Oral Daily  . lidocaine      . mulitivitamin with minerals  1 tablet Oral BH-q7a  . nitroGLYCERIN      . nitroGLYCERIN      . rosuvastatin  20 mg Oral Daily  . Travoprost (BAK Free)  1 drop Both Eyes QHS  . verapamil      . DISCONTD: olmesartan-hydrochlorothiazide  1 tablet Oral Daily   Infusions:    . sodium chloride    . heparin    . nitroGLYCERIN      Assessment: 78yo male transferred from [facility] for code STEMI now s/p cath, non-obstructive CAD to be managed medically, to begin heparin 8h after sheath pulled.  Goal of Therapy:  Heparin level 0.3-0.7 units/ml   Plan:  Sheath pulled in cath lab ~[place]; will begin heparin gtt at 1200 units/hr at 0800 this am and monitor heparin levels and CBC.  Colleen Can PharmD BCPS 10/06/2011,1:21 AM

## 2011-10-06 NOTE — Progress Notes (Signed)
Pt finally went to sleep after prn dose of Dilaudid--family went home--pt's VSS---appears comfortable.

## 2011-10-06 NOTE — Progress Notes (Signed)
Pt c/o of 4 oo10 mscp with deep inspiration and radiating  to jaw line--@ mg of morphine given IV--12 lead EKG obtained--reported info to Dr [place] and showed him the 12 leads--will continue to monitor and support--family at bedside--updated.

## 2011-10-06 NOTE — Progress Notes (Signed)
Patient ID: Darius Baxter, male   DOB: 05-01-1934, 78 y.o.   MRN: [id number]    SUBJECTIVE: Patient was admitted last night with back and chest pain.  He had some ST elevation on ECG and went for emergent cath.  This showed no significant CAD but did show findings concerning for Takotsubo cardiomyopathy.  BP is running low this morning, but patient got both Benicar/HCT and lisinopril.      Marland Kitchen aspirin EC  81 mg Oral Daily  . carvedilol  3.125 mg Oral BID WC  . furosemide  20 mg Oral BID  . heparin      . heparin      . ketorolac  1 drop Right Eye BID  . lidocaine      . lisinopril  20 mg Oral Daily  . mulitivitamin with minerals  1 tablet Oral BH-q7a  . nitroGLYCERIN      . nitroGLYCERIN      . prednisoLONE acetate  2 drop Right Eye QID  . rosuvastatin  10 mg Oral Daily  . Travoprost (BAK Free)  1 drop Both Eyes QHS  . verapamil      . DISCONTD: aspirin EC  81 mg Oral Daily  . DISCONTD: Bromfenac Sodium  2 drop Right Eye BID  . DISCONTD: Difluprednate  2 drop Right Eye BID  . DISCONTD: enoxaparin  40 mg Subcutaneous Q24H  . DISCONTD: furosemide  20 mg Oral Daily  . DISCONTD: hydrochlorothiazide  12.5 mg Oral Daily  . DISCONTD: mulitivitamin with minerals  1 tablet Oral BH-q7a  . DISCONTD: olmesartan  20 mg Oral Daily  . DISCONTD: olmesartan-hydrochlorothiazide  1 tablet Oral Daily  . DISCONTD: rosuvastatin  20 mg Oral Daily      Filed Vitals:   10/06/11 0738 10/06/11 0800 10/06/11 0900 10/06/11 1000  BP:  91/55 91/52 90/55   Pulse:  64 66 64  Temp: 97.5 F (36.4 C)     TempSrc: Oral     Resp:  18 13 15   Height:      Weight:      SpO2:  98% 96% 96%    Intake/Output Summary (Last 24 hours) at 10/06/11 1029 Last data filed at 10/06/11 1000  Gross per 24 hour  Intake   1410 ml  Output    500 ml  Net    910 ml    LABS: Basic Metabolic Panel:  Basename 10/06/11 0218  NA 137  K 4.5  CL 104  CO2 26  GLUCOSE 117*  BUN 23  CREATININE 1.13  CALCIUM 9.8  MG --    PHOS --   Liver Function Tests:  Basename 10/06/11 0218  AST 19  ALT 13  ALKPHOS 56  BILITOT 0.3  PROT 6.4  ALBUMIN 3.6   No results found for this basename: LIPASE:2,AMYLASE:2 in the last 72 hours CBC:  Basename 10/06/11 0218  WBC 8.6  NEUTROABS --  HGB 13.7  HCT 39.5  MCV 89.2  PLT 128*   Cardiac Enzymes:  Basename 10/06/11 0218  CKTOTAL 115  CKMB 10.9*  CKMBINDEX --  TROPONINI 3.20*   BNP: No components found with this basename: POCBNP:3 D-Dimer: No results found for this basename: DDIMER:2 in the last 72 hours Hemoglobin A1C: No results found for this basename: HGBA1C in the last 72 hours Fasting Lipid Panel:  Basename 10/06/11 0530  CHOL 143  HDL 40  LDLCALC 41  TRIG 310*  CHOLHDL 3.6  LDLDIRECT --     PHYSICAL EXAM  General: NAD Neck: JVP 8 cm, no thyromegaly or thyroid nodule.  Lungs: Clear to auscultation bilaterally with normal respiratory effort. CV: Nondisplaced PMI.  Heart regular S1/S2, no S3/S4, no murmur.  No peripheral edema.  No carotid bruit.  Normal pedal pulses.  Abdomen: Soft, nontender, no hepatosplenomegaly, no distention.  Neurologic: Alert and oriented x 3.  Psych: Normal affect. Extremities: No clubbing or cyanosis.   TELEMETRY: Reviewed telemetry pt in NSR  ASSESSMENT AND PLAN:  78 yo admitted with concern for STEMI but found to have no significantly obstructive disease on cath.  LV-gram was concerning for Takotsubo cardiomyopathy.  1. Cardiomyopathy: EF 35% with apical wall motion abnormalities consistent with stress (Takotsubo-type) cardiomyopathy in the absence of significant coronary disease.  He is now on ACEI and Coreg.  He is not grossly volume overloaded on exam but LVEDP high.  Oral Lasix has been started.  Echo today.  2. Chest/back pain with elevated cardiac enzymes: no significantly obstructive disease on cath and echo looks like stress cardiomyopathy.  This is most likely the cause of the chest and back pain. He  now has 1/10 very mild mid back pain.  Less likely but not completely ruled out is aortic dissection.   - CXR for mediastinal widening - Echo today can assess ascending aorta.  - If back pain does not completely resolve may need CTA chest to rule out dissection.   Darius Baxter 10/06/2011 10:37 AM

## 2011-10-06 NOTE — Progress Notes (Signed)
Pt cont to c/o smscp with radiation to jaw and back. Pt seems somewhat anxious although he denies feeling anxiety. Dr Patty Sermons notified---NTG gtt resume--prn ordered. RN will cont to monitor closely/

## 2011-10-06 NOTE — Progress Notes (Signed)
Subjective:  I was called to see patient for unrelieved chest pain. EKG showed no new acute changes. Sent to radiology for stat CT angio which showed no dissection but does show an enlarged gallbladder with stones.  His LFTs are normal.  He has a history of post-prandial gas and indigestion.  Objective:  Vital Signs in the last 24 hours: Temp:  [97.5 F (36.4 C)-99.9 F (37.7 C)] 99.9 F (37.7 C) (02/16 1548) Pulse Rate:  [64-83] 82  (02/16 1500) Resp:  [10-20] 18  (02/16 1500) BP: (90-129)/(52-76) 124/75 mmHg (02/16 1500) SpO2:  [95 %-99 %] 95 % (02/16 1500) Weight:  [220 lb 14.4 oz (100.2 kg)] 220 lb 14.4 oz (100.2 kg) (02/16 0500)  Intake/Output from previous day: 02/15 0701 - 02/16 0700 In: 700.5 [P.O.:240; I.V.:460.5] Out: -  Intake/Output from this shift: Total I/O In: 751 [P.O.:480; I.V.:231; Other:40] Out: 925 [Urine:925]     . aspirin EC  81 mg Oral Daily  . carvedilol  3.125 mg Oral BID WC  . enoxaparin (LOVENOX) injection  40 mg Subcutaneous Daily  . furosemide  20 mg Oral BID  . heparin      . heparin      . lidocaine      . lisinopril  20 mg Oral Daily  . mulitivitamin with minerals  1 tablet Oral BH-q7a  . nitroGLYCERIN      . nitroGLYCERIN      . rosuvastatin  10 mg Oral Daily  . verapamil      . DISCONTD: aspirin EC  81 mg Oral Daily  . DISCONTD: Bromfenac Sodium  2 drop Right Eye BID  . DISCONTD: Difluprednate  2 drop Right Eye BID  . DISCONTD: enoxaparin  40 mg Subcutaneous Q24H  . DISCONTD: furosemide  20 mg Oral Daily  . DISCONTD: hydrochlorothiazide  12.5 mg Oral Daily  . DISCONTD: ketorolac  1 drop Right Eye BID  . DISCONTD: mulitivitamin with minerals  1 tablet Oral BH-q7a  . DISCONTD: olmesartan  20 mg Oral Daily  . DISCONTD: olmesartan-hydrochlorothiazide  1 tablet Oral Daily  . DISCONTD: prednisoLONE acetate  2 drop Right Eye QID  . DISCONTD: rosuvastatin  20 mg Oral Daily  . DISCONTD: Travoprost (BAK Free)  1 drop Both Eyes QHS       . nitroGLYCERIN 10 mcg/min (10/06/11 1500)  . DISCONTD: sodium chloride 75 mL/hr at 10/06/11 0045  . DISCONTD: heparin    . DISCONTD: nitroGLYCERIN 5 mcg/min (10/06/11 0045)    Physical Exam: The patient appears to be in moderate distress.  Head and neck exam reveals that the pupils are equal and reactive.  The extraocular movements are full.  There is no scleral icterus.  Mouth and pharynx are benign.  No lymphadenopathy.  No carotid bruits.  The jugular venous pressure is normal.  Thyroid is not enlarged or tender.  Chest is clear to percussion and auscultation.  No rales or rhonchi.  Expansion of the chest is symmetrical.  Heart reveals no abnormal lift or heave.  First and second heart sounds are normal.  There is no murmur gallop rub or click.  The abdomen is mildly and variably tender in RUQ.  Soft bowel sounds.  Extremities reveal no phlebitis or edema.  Pedal pulses are good.  There is no cyanosis or clubbing.  Neurologic exam is normal strength and no lateralizing weakness.  No sensory deficits.  Integument reveals no rash  Lab Results:  Basename 10/06/11 0218  WBC 8.6  HGB 13.7  PLT 128*    Basename 10/06/11 0218  NA 137  K 4.5  CL 104  CO2 26  GLUCOSE 117*  BUN 23  CREATININE 1.13    Basename 10/06/11 0944 10/06/11 0218  TROPONINI 2.68* 3.20*   Hepatic Function Panel  Basename 10/06/11 0218  PROT 6.4  ALBUMIN 3.6  AST 19  ALT 13  ALKPHOS 56  BILITOT 0.3  BILIDIR --  IBILI --    Basename 10/06/11 0530  CHOL 143   No results found for this basename: PROTIME in the last 72 hours  Imaging: Imaging results have been reviewed CT Angio Abd/Pel w/ and/or w/o Status:  Final result                     Study Result     *RADIOLOGY REPORT*  Clinical Data: Chest pain  CT ANGIOGRAPHY CHEST, ABDOMEN AND PELVIS  Technique: Multidetector CT imaging through the chest, abdomen and  pelvis was performed using the standard protocol during  bolus  administration of intravenous contrast. Multiplanar reconstructed  images including MIPs were obtained and reviewed to evaluate the  vascular anatomy.  Contrast: OMNIPAQUE IOHEXOL 350 MG/ML IV SOLN  Comparison: None.  CTA CHEST  Findings: The noncontrast scan shows no hyperdense hematoma,  mediastinal fluid, or pericardial effusion.  Patchy coronary and aortic calcifications. No aortic dissection,  aneurysm, or stenosis. Classic three-vessel brachiocephalic  arterial origin anatomy without proximal stenosis. There is  incomplete opacification of the pulmonary arterial tree which is  grossly unremarkable; exam was not optimized for detection of  pulmonary emboli.  No pleural effusion. No hilar or mediastinal adenopathy. There is  some dependent atelectasis or scarring posteriorly in the lung  bases. Mild spondylitic changes in the lower thoracic spine.  Review of the MIP images confirms the above findings.  IMPRESSION:  1. Negative for acute PE or thoracic aortic dissection.  2. Patchy coronary and aortic calcifications.  CTA ABDOMEN AND PELVIS  Findings:  Abdominal aorta: Patchy calcified plaque without dissection,  aneurysm, or stenosis.  Celiac axis: Widely patent  Superior mesenteric artery: Widely patent, with classic distal  branching  Left renal artery: There are two, the inferior dominant. Both are  widely patent.  Right renal artery: Ligated proximally, post nephrectomy.  Inferior mesenteric artery: Origin stenosis related to aortic wall  plaque, patent distally  Bifurcation: Partially calcified plaque without aneurysm or  stenosis.  Left iliac: Mild tortuosity, with mild plaque in the proximal  common iliac. No dissection, aneurysm, or stenosis. Common  femoral artery and visualized portions of the proximal SFA and  profunda femoris widely patent.  Right iliac: Mild tortuosity without dissection, aneurysm, or  stenosis. Common femoral artery and  visualized portions of the  proximal SFA and profunda femoris widely patent.  Venous phase: Not obtained  Nonvascular findings: The gallbladder is distended with several  noncalcified stones suspected in its dependent portion. Probable  small cyst in the lateral left hepatic segment, incompletely  characterized. Unremarkable spleen, adrenal glands. Small left  renal cyst. Mild pancreatic atrophy without focal lesion. Small  bowel decompressed. Multiple descending and sigmoid diverticula  without adjacent inflammatory/edematous change. No ascites.  Urinary bladder physiologically distended. Moderate prostatic  enlargement with central coarse calcifications. Bilateral pelvic  phleboliths. No pelvic, retroperitoneal, or mesenteric adenopathy.  The portal vein is patent. Spondylitic changes in the lower lumbar  spine.  Review of the MIP images confirms the above findings.  IMPRESSION:  1.  No abdominal dissection, aneurysm, or significant branch vessel  disease.  2. Distended gallbladder containing stones.  3. Probable left renal and hepatic cysts, incompletely  characterized.  4. Descending and sigmoid diverticulosis.  Original Report Authenticated By: Osa Craver, M.D.    Cardiac Studies:  Assessment/Plan:  Patient Active Hospital Problem List: Takotsubo cardiomyopathy (10/06/2011)   Assessment: Echo confirms findings of Takotsubo   Plan: IV NTG overnight Cholelithiasis    Severe pain radiating to back but LFTs normal and physical findings are variable.     Plan: Dr. Davina Poke to see for surgery consult   LOS: 0 days    Cassell Clement 10/06/2011, 3:58 PM

## 2011-10-06 NOTE — H&P (Addendum)
Darius Baxter is an 78 y.o. male.    Chief Complaint: chest and back pain  HPI: 78 y/o male with a PMH of HTN, hyperlipidemia who was transferred from [facility] ER to [facility] for chest/back pain evaluation.  He was in his usual state of health up until about 8 pm the night of presentation when he developed pain between his scapula.  The pain was sharp, 8/10 in severity, non-radiating and was not associated with shortness of breath, diaphoresis, nausea or vomiting.  He presented to [facility] ER for evaluation of his infrascapular pain. While he was at [facility], he developed a sharp pain in the chest.  His ECG showed 1 mm ST elevation in leads V1 and V2 and 0.5 mm ST elevation in lead aVL.  Based on this presentation, he was transferred to [facility] as a code STEMI.  He was sent to the cath lab where he was found to have angiographically insignificant coronary artery disease with mildly elevated LVEDP.  His LV gram showed LV EF of 35% and was consistent with Takotsubo cardiomyopathy.  He denies any recent stressors.  He was started on Nitro drip with some relief of his back/chest pain.   Cardiac Cath 10/05/2011: Procedural Findings:  Hemodynamics:  AO 108/75 with a mean of 90 mmHg  LV 104/34 mmHg  Coronary angiography:  Coronary dominance: right  Left mainstem: Normal  Left anterior descending (LAD): 20-30% irregularities in the proximal vessel. This is a large vessel which extends around the apex. No obstructive disease is noted.  There is a large ramus intermediate branch which has 20% narrowing at the ostium.  Left circumflex (LCx): 20-30% narrowing at the ostium.  Right coronary artery (RCA): Normal.  Left ventriculography: Left ventricular size is increased. There is severe hypokinesis to akinesis of the mid to distal anterior wall, apex, and mid to distal inferior wall with overall moderately severe LV dysfunction. Ejection fraction is estimated at 35%.  Final Conclusions:  1.  Nonobstructive coronary disease.  2. Moderate to severe left ventricular dysfunction with characteristic appearance of Takotsubo cardiomyopathy.  Recommendations: Medical management.   Past Medical History  Diagnosis Date  . HTN (hypertension)   . HLD (hyperlipidemia)   . Ventricular ectopy     bigeminy  . Valvular heart disease     mild MVP/MR by 2-D echo, 8/10  . Seizures     had 1 seizure at age 49,unknown orgin.no more since and on no meds  . Chronic kidney disease     had right kidney removed for cancer-5 yrs ago  . Cancer     right kidney  . Arthritis   . Chronic back pain     due to arthritis    Past Surgical History  Procedure Date  . Nephrectomy     right nephrectomy for cancer  . Appendectomy   . Knee arthroscopy     right knee  . Right total knee 3 yrs ago    right 3 yrs ago at [facility]  . Cataract extraction w/phaco 03/22/2011    Procedure: CATARACT EXTRACTION PHACO AND INTRAOCULAR LENS PLACEMENT (IOC);  Surgeon: Gemma Payor;  Location: [facility] ORS;  Service: Ophthalmology;  Laterality: Right;  CDE  19.88  . Cataract extraction w/phaco 04/16/2011    Procedure: CATARACT EXTRACTION PHACO AND INTRAOCULAR LENS PLACEMENT (IOC);  Surgeon: Gemma Payor;  Location: [facility];  Service: Ophthalmology;  Laterality: Left;  CDE 21.13    Family History  Problem Relation Age of Onset  .  Cancer Other   . Stroke Other   . Coronary artery disease Other   . Hypertension Other   . Anesthesia problems Neg Hx   . Hypotension Neg Hx   . Malignant hyperthermia Neg Hx   . Pseudochol deficiency Neg Hx    Social History:  reports that he has never smoked. He does not have any smokeless tobacco history on file. He reports that he does not drink alcohol or use illicit drugs.  Allergies:  Allergies  Allergen Reactions  . Aleve ([employer] Naproxen Sodium)     Patient has only 1 kidney and Doctor  Doesn't want him to take    Medications Prior to Admission  Medication Dose Route Frequency Provider  Last Rate Last Dose  . 0.9 %  sodium chloride infusion   Intravenous Continuous Peter Swaziland, MD      . acetaminophen (TYLENOL) tablet 650 mg  650 mg Oral Q4H PRN Peter Swaziland, MD      . aspirin EC tablet 81 mg  81 mg Oral Daily Peter Swaziland, MD      . Difluprednate 0.05 % EMUL 2 drop  2 drop Right Eye BID Peter Swaziland, MD      . furosemide (LASIX) tablet 20 mg  20 mg Oral Daily Peter Swaziland, MD      . heparin 1000 UNIT/ML injection           . heparin 2-0.9 UNIT/ML-% infusion           . heparin ADULT infusion 100 units/ml (25000 units/250 ml)  1,200 Units/hr Intravenous Continuous Veronda Hollie Salk, PHARMD      . olmesartan [facility]) tablet 20 mg  20 mg Oral Daily Peter Swaziland, MD       And  . hydrochlorothiazide (MICROZIDE) capsule 12.5 mg  12.5 mg Oral Daily Peter Swaziland, MD      . ketorolac (ACULAR) 0.5 % ophthalmic solution 1 drop  1 drop Right Eye BID Peter Swaziland, MD      . lidocaine (XYLOCAINE) 1 % injection           . morphine 2 MG/ML injection 2 mg  2 mg Intravenous Q1H PRN Peter Swaziland, MD      . mulitivitamin with minerals tablet 1 tablet  1 tablet Oral BH-q7a Peter Swaziland, MD      . nitroGLYCERIN (NTG ON-CALL) 0.2 mg/mL injection           . nitroGLYCERIN 0.2 mg/mL in dextrose 5 % infusion           . nitroGLYCERIN 0.2 mg/mL in dextrose 5 % infusion  2-200 mcg/min Intravenous Continuous Peter Swaziland, MD      . ondansetron [facility]) injection 4 mg  4 mg Intravenous Q6H PRN Peter Swaziland, MD      . oxyCODONE-acetaminophen (PERCOCET) 5-325 MG per tablet 1-2 tablet  1-2 tablet Oral Q4H PRN Peter Swaziland, MD      . rosuvastatin (CRESTOR) tablet 20 mg  20 mg Oral Daily Peter Swaziland, MD      . Travoprost (BAK Free) (TRAVATAN) 0.004 % ophthalmic solution SOLN 1 drop  1 drop Both Eyes QHS Peter Swaziland, MD      . verapamil (ISOPTIN) 2.5 MG/ML injection           . DISCONTD: Bromfenac Sodium 0.09 % SOLN 2 drop  2 drop Right Eye BID Peter Swaziland, MD      . DISCONTD:  olmesartan-hydrochlorothiazide (BENICAR HCT) 20-12.5 MG per tablet 1 tablet  1  tablet Oral Daily Peter Swaziland, MD       Medications Prior to Admission  Medication Sig Dispense Refill  . aspirin 81 MG EC tablet Take 81 mg by mouth daily.        Marland Kitchen aspirin 81 MG tablet Take 81 mg by mouth every morning.       Marland Kitchen atorvastatin (LIPITOR) 20 MG tablet Take 20 mg by mouth daily.        Marland Kitchen atorvastatin (LIPITOR) 40 MG tablet Take 20 mg by mouth daily with supper. Take 1/2 tab      . Bromfenac Sodium (BROMDAY) 0.09 % (DAILY) SOLN Place 2 drops into the right eye 2 (two) times daily.        . Difluprednate (DUREZOL) 0.05 % EMUL Place 2 drops into the right eye 2 (two) times daily.        . felodipine (PLENDIL) 2.5 MG 24 hr tablet Take 2.5 mg by mouth daily.       . furosemide (LASIX) 20 MG tablet Take 20 mg by mouth daily.       . hydrocodone-acetaminophen (LORCET-HD) 5-500 MG per capsule Take 1 capsule by mouth 2 (two) times a week. Patient usually takes on Wednesday and Sunday mornings.       . Multiple Vitamins-Minerals (MULTIVITAL) tablet Take 1 tablet by mouth every morning.       . olmesartan-hydrochlorothiazide (BENICAR HCT) 20-12.5 MG per tablet Take 1 tablet by mouth daily.       . quinapril (ACCUPRIL) 40 MG tablet Take 40 mg by mouth every morning.       . travoprost, benzalkonium, (TRAVATAN Z) 0.004 % ophthalmic solution Place 1 drop into both eyes at bedtime.         No results found for this or any previous visit (from the past 48 hour(s)). No results found.  Review of Systems  Constitutional: Negative.  Negative for fever, chills, weight loss, malaise/fatigue and diaphoresis.  HENT: Negative.  Negative for hearing loss, ear pain, nosebleeds, congestion, neck pain, tinnitus and ear discharge.   Eyes: Negative.  Negative for blurred vision, double vision, photophobia, pain and discharge.  Respiratory: Negative.  Negative for cough, hemoptysis, sputum production, shortness of breath and  wheezing.   Cardiovascular: Positive for chest pain. Negative for palpitations, orthopnea, claudication and leg swelling.  Gastrointestinal: Negative.  Negative for heartburn, nausea, vomiting, abdominal pain, diarrhea, constipation and blood in stool.  Genitourinary: Negative.  Negative for dysuria, urgency, frequency and hematuria.  Musculoskeletal: Positive for back pain and joint pain. Negative for myalgias and falls.  Skin: Negative.  Negative for itching and rash.  Neurological: Negative.  Negative for dizziness, tingling, tremors, sensory change, speech change, focal weakness, seizures, weakness and headaches.  Endo/Heme/Allergies: Negative for environmental allergies. Does not bruise/bleed easily.  Psychiatric/Behavioral: Negative.  Negative for depression, suicidal ideas and hallucinations.    Blood pressure 104/75, pulse 77, temperature 98 F (36.7 C), temperature source Oral, resp. rate 11, height 6\' 1"  (1.854 m), weight 100.2 kg (220 lb 14.4 oz), SpO2 98.00%. Physical Exam  Constitutional: He is oriented to person, place, and time. He appears well-developed and well-nourished. No distress.  HENT:  Head: Normocephalic and atraumatic.  Eyes: EOM are normal. Pupils are equal, round, and reactive to light. Right eye exhibits no discharge. No scleral icterus.  Neck: Normal range of motion. Neck supple. No JVD present. No tracheal deviation present. No thyromegaly present.  Cardiovascular: Normal rate, regular rhythm and normal heart sounds.  Exam  reveals no gallop and no friction rub.   No murmur heard. Respiratory: Effort normal and breath sounds normal. No respiratory distress. He has no wheezes. He has no rales. He exhibits no tenderness.  GI: Soft. Bowel sounds are normal. He exhibits no distension. There is no tenderness. There is no rebound and no guarding.  Musculoskeletal: Normal range of motion. He exhibits no edema and no tenderness.  Neurological: He is alert and oriented to  person, place, and time. No cranial nerve deficit. Coordination normal.  Skin: Skin is warm and dry. No rash noted. He is not diaphoretic. No erythema.  Psychiatric: He has a normal mood and affect.     Assessment  1. Takotsubo Cardiomyopathy 2.  Chest pain 3.  Back pain 4.  Angiographically insignificant coronary artery disease  Plan  Will admit to cardiology, and observe the patient on telemetry.  Will start him on ACEI (he takes Accupril at home) and a low dose beta-blockers and diurese the patient since he has high LVEDP.  He will undergo a 2-D echocardiogram in the morning to evaluate his left and right ventricular systolic function, diastolic function and to evaluate his valves  Dondra Rhett E 10/06/2011, 1:40 AM

## 2011-10-06 NOTE — Progress Notes (Signed)
*  PRELIMINARY RESULTS* Echocardiogram 2D Echocardiogram has been performed.  Clide Deutscher 10/06/2011, 11:28 AM

## 2011-10-06 NOTE — Progress Notes (Signed)
  Infectious Disease Pharmacy     Total Antibiotics Day 1         Cipro---> 1            Darius Baxter is an 78 y.o. male who presented to [facility] on 10/06/2011 with a chief complaint of abdominal pain.     Filed Vitals:   10/06/11 1600  BP: 112/64  Pulse: 80  Temp:   Resp: 16    Lab 10/06/11 0218  NA 137  K 4.5  CL 104  CO2 26  GLUCOSE 117*  BUN 23  CREATININE 1.13  CALCIUM 9.8  MG --  PHOS --    Lab 10/06/11 0218  WBC 8.6    Past Medical History  Diagnosis Date  . HTN (hypertension)   . HLD (hyperlipidemia)   . Ventricular ectopy     bigeminy  . Valvular heart disease     mild MVP/MR by 2-D echo, 8/10  . Seizures     had 1 seizure at age 73,unknown orgin.no more since and on no meds  . Chronic kidney disease     had right kidney removed for cancer-5 yrs ago  . Cancer     right kidney  . Arthritis   . Chronic back pain     due to arthritis      Recent Results (from the past 720 hour(s))  MRSA PCR SCREENING     Status: Normal   Collection Time   10/06/11 12:45 AM      Component Value Range Status Comment   MRSA by PCR NEGATIVE  NEGATIVE  Final     Assessment: He is suspect of acute cholecystitis. Empiric cipro ordered to cover for infection.   Plan:  Cipro 400mg  IV q12

## 2011-10-07 ENCOUNTER — Inpatient Hospital Stay (HOSPITAL_COMMUNITY): Payer: Medicare Other

## 2011-10-07 DIAGNOSIS — I5181 Takotsubo syndrome: Principal | ICD-10-CM

## 2011-10-07 LAB — COMPREHENSIVE METABOLIC PANEL
AST: 16 U/L (ref 0–37)
Albumin: 3.4 g/dL — ABNORMAL LOW (ref 3.5–5.2)
Alkaline Phosphatase: 57 U/L (ref 39–117)
Chloride: 102 mEq/L (ref 96–112)
Potassium: 4.2 mEq/L (ref 3.5–5.1)
Total Bilirubin: 0.8 mg/dL (ref 0.3–1.2)
Total Protein: 6.7 g/dL (ref 6.0–8.3)

## 2011-10-07 LAB — CBC
MCHC: 31.7 g/dL (ref 30.0–36.0)
Platelets: 131 10*3/uL — ABNORMAL LOW (ref 150–400)
RDW: 13.6 % (ref 11.5–15.5)
WBC: 9.7 10*3/uL (ref 4.0–10.5)

## 2011-10-07 MED ORDER — FENTANYL CITRATE 0.05 MG/ML IJ SOLN
INTRAMUSCULAR | Status: AC
  Administered 2011-10-07: 25 ug
  Filled 2011-10-07: qty 4

## 2011-10-07 MED ORDER — MIDAZOLAM HCL 5 MG/5ML IJ SOLN
INTRAMUSCULAR | Status: AC | PRN
  Administered 2011-10-07: 0.5 mg via INTRAVENOUS
  Administered 2011-10-07: 1 mg via INTRAVENOUS
  Administered 2011-10-07: 0.5 mg via INTRAVENOUS

## 2011-10-07 MED ORDER — MIDAZOLAM HCL 2 MG/2ML IJ SOLN
INTRAMUSCULAR | Status: AC
  Administered 2011-10-07: 1 mg
  Filled 2011-10-07: qty 4

## 2011-10-07 MED ORDER — FENTANYL CITRATE 0.05 MG/ML IJ SOLN
INTRAMUSCULAR | Status: AC | PRN
  Administered 2011-10-07 (×2): 50 ug via INTRAVENOUS
  Administered 2011-10-07 (×3): 25 ug via INTRAVENOUS

## 2011-10-07 NOTE — Procedures (Signed)
CT guided 17F GB drain 20ml dark bile out, for GS, C&S No complication No blood loss. See complete dictation in [facility].

## 2011-10-07 NOTE — Progress Notes (Addendum)
SUBJECTIVE:  78 y/o male with a PMH of HTN, hyperlipidemia who was transferred from [facility] ER to [facility] for chest/back pain evaluation. He was in his usual state of health up until about 8 pm the night of presentation when he developed pain between his scapula. The pain was sharp, 8/10 in severity, non-radiating and was not associated with shortness of breath, diaphoresis, nausea or vomiting. He presented to [facility] ER for evaluation of his infrascapular pain. While he was at [facility], he developed a sharp pain in the chest. His ECG showed 1 mm ST elevation in leads V1 and V2 and 0.5 mm ST elevation in lead aVL. Based on this presentation, he was transferred to [facility] as a code STEMI.  He was sent to the cath lab where he was found to have angiographically insignificant coronary artery disease with mildly elevated LVEDP. His LV gram showed LV EF of 35% and was consistent with Takotsubo cardiomyopathy.   2/17: Patient still has RUQ pain which radiates back to the shoulder blades. No nausea, vomiting or chest pain.  Schedule for IR guided cholecystostomy tube placement today. Troponins trending down.   PHYSICAL EXAM Filed Vitals:   10/07/11 0500 10/07/11 0600 10/07/11 0800 10/07/11 0900  BP: 103/55 99/53 96/51  91/43  Pulse: 67 72 58 65  Temp:   98.3 F (36.8 C)   TempSrc:   Oral   Resp: 9 14 10 19   Height:      Weight: 223 lb 1.7 oz (101.2 kg)     SpO2: 96% 92% 96% 97%   General:  In minimal distress due to pain. Lungs: CTA bilaterally. No wheezes or crackles. Heart:  S1, S2, RRR. No murmurs. PMI nondisplaced. No JVD. Abdomen:  Bowel sounds present. Nondistended. Right upper quadrant and epigastric minimal tenderness to palpation. Extremities:  No pedal edema  LABS: Lab Results  Component Value Date   CKTOTAL 77 10/06/2011   CKMB 6.6* 10/06/2011   TROPONINI 1.28* 10/06/2011   Results for orders placed during the hospital encounter of 10/06/11 (from the past 24  hour(s))  CARDIAC PANEL(CRET KIN+CKTOT+MB+TROPI)     Status: Abnormal   Collection Time   10/06/11  9:44 AM      Component Value Range   Total CK 102  7 - 232 (U/L)   CK, MB 10.0 (*) 0.3 - 4.0 (ng/mL)   Troponin I 2.68 (*) <0.30 (ng/mL)   Relative Index 9.8 (*) 0.0 - 2.5   CARDIAC PANEL(CRET KIN+CKTOT+MB+TROPI)     Status: Abnormal   Collection Time   10/06/11  3:24 PM      Component Value Range   Total CK 77  7 - 232 (U/L)   CK, MB 6.6 (*) 0.3 - 4.0 (ng/mL)   Troponin I 1.28 (*) <0.30 (ng/mL)   Relative Index RELATIVE INDEX IS INVALID  0.0 - 2.5   LIPASE, BLOOD     Status: Normal   Collection Time   10/06/11  4:33 PM      Component Value Range   Lipase 23  11 - 59 (U/L)  CBC     Status: Abnormal   Collection Time   10/07/11  5:42 AM      Component Value Range   WBC 9.7  4.0 - 10.5 (K/uL)   RBC 4.41  4.22 - 5.81 (MIL/uL)   Hemoglobin 12.8 (*) 13.0 - 17.0 (g/dL)   HCT 65.7  84.6 - 96.2 (%)   MCV 91.6  78.0 - 100.0 (  fL)   MCH 29.0  26.0 - 34.0 (pg)   MCHC 31.7  30.0 - 36.0 (g/dL)   RDW 96.0  45.4 - 09.8 (%)   Platelets 131 (*) 150 - 400 (K/uL)  COMPREHENSIVE METABOLIC PANEL     Status: Abnormal   Collection Time   10/07/11  5:42 AM      Component Value Range   Sodium 137  135 - 145 (mEq/L)   Potassium 4.2  3.5 - 5.1 (mEq/L)   Chloride 102  96 - 112 (mEq/L)   CO2 26  19 - 32 (mEq/L)   Glucose, Bld 120 (*) 70 - 99 (mg/dL)   BUN 26 (*) 6 - 23 (mg/dL)   Creatinine, Ser 1.19 (*) 0.50 - 1.35 (mg/dL)   Calcium 9.8  8.4 - 14.7 (mg/dL)   Total Protein 6.7  6.0 - 8.3 (g/dL)   Albumin 3.4 (*) 3.5 - 5.2 (g/dL)   AST 16  0 - 37 (U/L)   ALT 10  0 - 53 (U/L)   Alkaline Phosphatase 57  39 - 117 (U/L)   Total Bilirubin 0.8  0.3 - 1.2 (mg/dL)   GFR calc non Af Amer 47 (*) >90 (mL/min)   GFR calc Af Amer 54 (*) >90 (mL/min)    Intake/Output Summary (Last 24 hours) at 10/07/11 0940 Last data filed at 10/07/11 0900  Gross per 24 hour  Intake 1402.17 ml  Output   1075 ml  Net 327.17  ml   2-D echo: - Left ventricle: The cavity size was normal. Wall thickness was increased in a pattern of moderate LVH. Systolic function was moderately to severely reduced. The estimated ejection fraction was in the range of 30% to 35%. Doppler parameters are consistent with abnormal left ventricular relaxation (grade 1 diastolic dysfunction). - Regional wall motion abnormality: Akinesis of the mid-apical anterior, mid anteroseptal, mid inferoseptal, apical inferior, mid inferolateral, mid anterolateral, apical septal, apical lateral, and apical myocardium. Distribution of segmental wall motion abnormalities c/w either ischemic cardiomyopathy or Tako- tsubo cardiomyopathy. - Right ventricle: Akinesis distal RV free wall. The cavity size was mildly dilated. Systolic function was normal.    EKG:  NSR, no ST elevation.  ASSESSMENT AND PLAN:  1) STEMI: 1 mm ST elevation in V1 and V2 and half mm elevation in lead aVL. ST depression in inferior leads and presentation. Cardiac cath done by Dr. Swaziland- shows no severe CAD, but findings consistent with Takotsubo cardiomyopathy- 2-D echo confirms the findings. - Continue beta blocker, aspirin, statin, ACE inhibitor.  2) Takotsubo cardiomyopathy: Consistent findings per cardiac cath and 2-D echo. - Continue management as #1   3) Acute cholecystitis: Management per surgery. - Ultrasound abdomen: Stones in gallbladder neck. Right nephrectomy. - Not a candidate for cholecystectomy present due to cardiac event and possible acute cholecystitis. - IR guided cholecystostomy tube placement today.  4) Worsening renal function: Creatinine 1.42 from 1.14. Does not meet definition of acute kidney injury. - Most likely due to cardiac cath. - Will follow with BMP.   PATEL,RAVI 10/07/2011 9:40 AM  Patient seen with resident, agree with note.  Now he has RUQ pain and tenderness (was mid scapular pain yesterday). Has acute cholecystitis versus  symptomatic cholelithiasis (no fever or elevated WBCs). I suspect that his Takotsubo cardiomyopathy was caused by the stress of the gallbladder disease.  BP running low today, Coreg/lisinopril held.  Will decrease lisinopril dose to 2.5 mg daily given low BP and elevated creatinine.  Hold pm Lasix with  elevated creatinine (mildly).  He is not short of breath and is on room air.  Getting cholecystostomy tube today.   Marca Ancona 10/07/2011 12:11 PM

## 2011-10-07 NOTE — Progress Notes (Signed)
Pt to Radiology for drain placement. I left message for wife to update per pt request. Dr Jearld Pies present.

## 2011-10-07 NOTE — Progress Notes (Signed)
Subjective: Pain since they have been giving me pain meds. Some n/v yesterday. Regurgitated some liquid this am. +flatus   Objective: Vital signs in last 24 hours: Temp:  [98.3 F (36.8 C)-99.9 F (37.7 C)] 98.3 F (36.8 C) (02/17 0800) Pulse Rate:  [58-83] 58  (02/17 0800) Resp:  [9-20] 10  (02/17 0800) BP: (90-129)/(51-75) 96/51 mmHg (02/17 0800) SpO2:  [92 %-99 %] 96 % (02/17 0800) Weight:  [223 lb 1.7 oz (101.2 kg)] 223 lb 1.7 oz (101.2 kg) (02/17 0500)    Intake/Output from previous day: 02/16 0701 - 02/17 0700 In: 1815.2 [P.O.:660; I.V.:675.2; IV Piggyback:400] Out: 1575 [Urine:1575] Intake/Output this shift: Total I/O In: 50 [I.V.:50] Out: -   NAD, resting comfortably Soft, mild TTP in RUQ. No RT. No guarding.  Lab Results:   Basename 10/07/11 0542 10/06/11 0218  WBC 9.7 8.6  HGB 12.8* 13.7  HCT 40.4 39.5  PLT 131* 128*   BMET  Basename 10/07/11 0542 10/06/11 0218  NA 137 137  K 4.2 4.5  CL 102 104  CO2 26 26  GLUCOSE 120* 117*  BUN 26* 23  CREATININE 1.42* 1.13  CALCIUM 9.8 9.8   PT/INR  Basename 10/06/11 0218  LABPROT 13.5  INR 1.01   ABG No results found for this basename: PHART:2,PCO2:2,PO2:2,HCO3:2 in the last 72 hours  Studies/Results: Ct Angio Chest W/cm &/or Wo Cm  10/06/2011  *RADIOLOGY REPORT*  Clinical Data:  Chest pain  CT ANGIOGRAPHY CHEST, ABDOMEN AND PELVIS  Technique:  Multidetector CT imaging through the chest, abdomen and pelvis was performed using the standard protocol during bolus administration of intravenous contrast.  Multiplanar reconstructed images including MIPs were obtained and reviewed to evaluate the vascular anatomy.  Contrast: OMNIPAQUE IOHEXOL 350 MG/ML IV SOLN  Comparison:   None.  CTA CHEST  Findings:  The noncontrast scan shows no hyperdense hematoma, mediastinal fluid, or pericardial effusion.  Patchy coronary and aortic calcifications.  No aortic dissection, aneurysm, or stenosis.  Classic three-vessel  brachiocephalic arterial origin anatomy without proximal stenosis.  There is incomplete opacification of the pulmonary arterial tree which is grossly unremarkable; exam was not optimized for detection of pulmonary emboli.  No pleural effusion.  No hilar or mediastinal adenopathy.  There is some dependent atelectasis or scarring posteriorly in the lung bases. Mild spondylitic changes in the lower thoracic spine.   Review of the MIP images confirms the above findings.  IMPRESSION:  1.  Negative for acute PE or thoracic aortic dissection. 2.  Patchy coronary and aortic calcifications.  CTA ABDOMEN AND PELVIS  Findings: Abdominal aorta:  Patchy calcified plaque without dissection, aneurysm, or stenosis.  Celiac axis:  Widely patent  Superior mesenteric artery:  Widely patent, with classic distal branching  Left renal artery:  There are two, the inferior dominant.  Both are widely patent.  Right renal artery:  Ligated proximally, post nephrectomy.  Inferior mesenteric artery:  Origin stenosis related to aortic wall plaque, patent distally  Bifurcation:  Partially calcified plaque without aneurysm or stenosis.  Left iliac: Mild tortuosity, with mild plaque in the proximal common iliac.  No   dissection, aneurysm, or stenosis. Common femoral artery and visualized portions of the proximal SFA and profunda femoris widely patent.  Right iliac:  Mild tortuosity without dissection, aneurysm, or stenosis. Common femoral artery and visualized portions of the proximal SFA and profunda femoris widely patent.  Venous phase:  Not obtained  Nonvascular findings:  The gallbladder is distended with several noncalcified stones  suspected in its dependent portion.  Probable small cyst in the lateral left hepatic segment, incompletely characterized.  Unremarkable spleen, adrenal glands.  Small left renal cyst.  Mild pancreatic atrophy without focal lesion.  Small bowel decompressed.  Multiple descending and sigmoid diverticula without  adjacent inflammatory/edematous change.  No ascites. Urinary bladder physiologically distended.  Moderate prostatic enlargement with central coarse calcifications.  Bilateral pelvic phleboliths.  No pelvic, retroperitoneal, or mesenteric adenopathy. The portal vein is patent.  Spondylitic changes in the lower lumbar spine.  Review of the MIP images confirms the above findings.  IMPRESSION:  1.  No abdominal dissection, aneurysm, or significant branch vessel disease. 2.  Distended gallbladder containing stones. 3.  Probable left renal and hepatic cysts, incompletely characterized. 4.  Descending and sigmoid diverticulosis.  Original Report Authenticated By: Osa Craver, M.D.   Ct Angio Abd/pel W/ And/or W/o  10/06/2011  *RADIOLOGY REPORT*  Clinical Data:  Chest pain  CT ANGIOGRAPHY CHEST, ABDOMEN AND PELVIS  Technique:  Multidetector CT imaging through the chest, abdomen and pelvis was performed using the standard protocol during bolus administration of intravenous contrast.  Multiplanar reconstructed images including MIPs were obtained and reviewed to evaluate the vascular anatomy.  Contrast: OMNIPAQUE IOHEXOL 350 MG/ML IV SOLN  Comparison:   None.  CTA CHEST  Findings:  The noncontrast scan shows no hyperdense hematoma, mediastinal fluid, or pericardial effusion.  Patchy coronary and aortic calcifications.  No aortic dissection, aneurysm, or stenosis.  Classic three-vessel brachiocephalic arterial origin anatomy without proximal stenosis.  There is incomplete opacification of the pulmonary arterial tree which is grossly unremarkable; exam was not optimized for detection of pulmonary emboli.  No pleural effusion.  No hilar or mediastinal adenopathy.  There is some dependent atelectasis or scarring posteriorly in the lung bases. Mild spondylitic changes in the lower thoracic spine.   Review of the MIP images confirms the above findings.  IMPRESSION:  1.  Negative for acute PE or thoracic aortic  dissection. 2.  Patchy coronary and aortic calcifications.  CTA ABDOMEN AND PELVIS  Findings: Abdominal aorta:  Patchy calcified plaque without dissection, aneurysm, or stenosis.  Celiac axis:  Widely patent  Superior mesenteric artery:  Widely patent, with classic distal branching  Left renal artery:  There are two, the inferior dominant.  Both are widely patent.  Right renal artery:  Ligated proximally, post nephrectomy.  Inferior mesenteric artery:  Origin stenosis related to aortic wall plaque, patent distally  Bifurcation:  Partially calcified plaque without aneurysm or stenosis.  Left iliac: Mild tortuosity, with mild plaque in the proximal common iliac.  No   dissection, aneurysm, or stenosis. Common femoral artery and visualized portions of the proximal SFA and profunda femoris widely patent.  Right iliac:  Mild tortuosity without dissection, aneurysm, or stenosis. Common femoral artery and visualized portions of the proximal SFA and profunda femoris widely patent.  Venous phase:  Not obtained  Nonvascular findings:  The gallbladder is distended with several noncalcified stones suspected in its dependent portion.  Probable small cyst in the lateral left hepatic segment, incompletely characterized.  Unremarkable spleen, adrenal glands.  Small left renal cyst.  Mild pancreatic atrophy without focal lesion.  Small bowel decompressed.  Multiple descending and sigmoid diverticula without adjacent inflammatory/edematous change.  No ascites. Urinary bladder physiologically distended.  Moderate prostatic enlargement with central coarse calcifications.  Bilateral pelvic phleboliths.  No pelvic, retroperitoneal, or mesenteric adenopathy. The portal vein is patent.  Spondylitic changes in the lower lumbar  spine.  Review of the MIP images confirms the above findings.  IMPRESSION:  1.  No abdominal dissection, aneurysm, or significant branch vessel disease. 2.  Distended gallbladder containing stones. 3.  Probable left  renal and hepatic cysts, incompletely characterized. 4.  Descending and sigmoid diverticulosis.  Original Report Authenticated By: Osa Craver, M.D.    Anti-infectives: Anti-infectives     Start     Dose/Rate Route Frequency Ordered Stop   10/06/11 1800   ciprofloxacin (CIPRO) IVPB 400 mg        400 mg 200 mL/hr over 60 Minutes Intravenous Every 12 hours 10/06/11 1703            Assessment/Plan: s/p Procedure(s) (LRB): LEFT HEART CATHETERIZATION WITH CORONARY ANGIOGRAM (N/A) MI Elevated Cr - will defer to primary team Symptomatic cholelithiasis, possible acute on chronic cholecystitis - getting u/s now. Plan is for IR guided cholecystostomy tube placement later this am  Darius Baxter. Darius Campanile, MD, FACS General, Bariatric, & Minimally Invasive Surgery [facility], [place]     LOS: 1 day    Darius Baxter 10/07/2011

## 2011-10-08 LAB — BASIC METABOLIC PANEL
BUN: 27 mg/dL — ABNORMAL HIGH (ref 6–23)
Calcium: 9.3 mg/dL (ref 8.4–10.5)
GFR calc Af Amer: 59 mL/min — ABNORMAL LOW (ref >90)
GFR calc non Af Amer: 51 mL/min — ABNORMAL LOW (ref >90)
Potassium: 4.2 mEq/L (ref 3.5–5.1)
Sodium: 136 mEq/L (ref 135–145)

## 2011-10-08 LAB — CBC
MCH: 30.4 pg (ref 26.0–34.0)
MCHC: 33.8 g/dL (ref 30.0–36.0)
RDW: 13.4 % (ref 11.5–15.5)

## 2011-10-08 MED ORDER — ADULT MULTIVITAMIN W/MINERALS CH
1.0000 | ORAL_TABLET | Freq: Every day | ORAL | Status: DC
  Administered 2011-10-09 – 2011-10-10 (×2): 1 via ORAL
  Filled 2011-10-08 (×3): qty 1

## 2011-10-08 NOTE — Progress Notes (Signed)
Subjective: No abdominal pain this am.  Tolerating diet.  Objective: Vital signs in last 24 hours: Temp:  [97.7 F (36.5 C)-98.2 F (36.8 C)] 97.7 F (36.5 C) (02/18 0800) Pulse Rate:  [59-81] 81  (02/18 0759) Resp:  [9-21] 13  (02/18 0700) BP: (99-191)/(45-114) 132/64 mmHg (02/18 0759) SpO2:  [94 %-100 %] 95 % (02/18 0400) Weight:  [219 lb 5.7 oz (99.5 kg)] 219 lb 5.7 oz (99.5 kg) (02/18 0500)    Intake/Output from previous day: 02/17 0701 - 02/18 0700 In: 1945 [P.O.:580; I.V.:1160; IV Piggyback:200] Out: 1280 [Urine:1100; Drains:180] Intake/Output this shift: Total I/O In: 200 [IV Piggyback:200] Out: -   General appearance: alert and no distress Resp: clear to auscultation bilaterally Cardio: normal rate, regular rhythm GI: soft, nontender this am, nondistended, drain with bilious output  Lab Results:   Basename 10/08/11 0459 10/07/11 0542  WBC 7.8 9.7  HGB 12.9* 12.8*  HCT 38.2* 40.4  PLT 127* 131*   BMET  Basename 10/08/11 0459 10/07/11 0542  NA 136 137  K 4.2 4.2  CL 103 102  CO2 27 26  GLUCOSE 98 120*  BUN 27* 26*  CREATININE 1.32 1.42*  CALCIUM 9.3 9.8   PT/INR  Basename 10/06/11 0218  LABPROT 13.5  INR 1.01   ABG No results found for this basename: PHART:2,PCO2:2,PO2:2,HCO3:2 in the last 72 hours  Studies/Results: Ct Angio Chest W/cm &/or Wo Cm  10/06/2011  *RADIOLOGY REPORT*  Clinical Data:  Chest pain  CT ANGIOGRAPHY CHEST, ABDOMEN AND PELVIS  Technique:  Multidetector CT imaging through the chest, abdomen and pelvis was performed using the standard protocol during bolus administration of intravenous contrast.  Multiplanar reconstructed images including MIPs were obtained and reviewed to evaluate the vascular anatomy.  Contrast: OMNIPAQUE IOHEXOL 350 MG/ML IV SOLN  Comparison:   None.  CTA CHEST  Findings:  The noncontrast scan shows no hyperdense hematoma, mediastinal fluid, or pericardial effusion.  Patchy coronary and aortic  calcifications.  No aortic dissection, aneurysm, or stenosis.  Classic three-vessel brachiocephalic arterial origin anatomy without proximal stenosis.  There is incomplete opacification of the pulmonary arterial tree which is grossly unremarkable; exam was not optimized for detection of pulmonary emboli.  No pleural effusion.  No hilar or mediastinal adenopathy.  There is some dependent atelectasis or scarring posteriorly in the lung bases. Mild spondylitic changes in the lower thoracic spine.   Review of the MIP images confirms the above findings.  IMPRESSION:  1.  Negative for acute PE or thoracic aortic dissection. 2.  Patchy coronary and aortic calcifications.  CTA ABDOMEN AND PELVIS  Findings: Abdominal aorta:  Patchy calcified plaque without dissection, aneurysm, or stenosis.  Celiac axis:  Widely patent  Superior mesenteric artery:  Widely patent, with classic distal branching  Left renal artery:  There are two, the inferior dominant.  Both are widely patent.  Right renal artery:  Ligated proximally, post nephrectomy.  Inferior mesenteric artery:  Origin stenosis related to aortic wall plaque, patent distally  Bifurcation:  Partially calcified plaque without aneurysm or stenosis.  Left iliac: Mild tortuosity, with mild plaque in the proximal common iliac.  No   dissection, aneurysm, or stenosis. Common femoral artery and visualized portions of the proximal SFA and profunda femoris widely patent.  Right iliac:  Mild tortuosity without dissection, aneurysm, or stenosis. Common femoral artery and visualized portions of the proximal SFA and profunda femoris widely patent.  Venous phase:  Not obtained  Nonvascular findings:  The gallbladder is  distended with several noncalcified stones suspected in its dependent portion.  Probable small cyst in the lateral left hepatic segment, incompletely characterized.  Unremarkable spleen, adrenal glands.  Small left renal cyst.  Mild pancreatic atrophy without focal lesion.   Small bowel decompressed.  Multiple descending and sigmoid diverticula without adjacent inflammatory/edematous change.  No ascites. Urinary bladder physiologically distended.  Moderate prostatic enlargement with central coarse calcifications.  Bilateral pelvic phleboliths.  No pelvic, retroperitoneal, or mesenteric adenopathy. The portal vein is patent.  Spondylitic changes in the lower lumbar spine.  Review of the MIP images confirms the above findings.  IMPRESSION:  1.  No abdominal dissection, aneurysm, or significant branch vessel disease. 2.  Distended gallbladder containing stones. 3.  Probable left renal and hepatic cysts, incompletely characterized. 4.  Descending and sigmoid diverticulosis.  Original Report Authenticated By: Osa Craver, M.D.   Ct Guided Abscess Drain  10/07/2011   *RADIOLOGY REPORT*  Clinical data: Cholelithiasis with gallbladder distention and pain. Cardiomyopathy.  PERCUTANEOUS CHOLECYSTOSTOMY TUBE PLACEMENT WITH CT GUIDANCE:  Technique: The procedure, risks (including but not limited to bleeding, infection, organ damage), benefits, and alternatives were explained to the patient.  Questions regarding the procedure were encouraged and answered.  The patient understands and consents to the procedure.CT guidance was selected because of colonic interposition noted on recent abdomen CT.Survey scans of the abdomen   performed and an appropriate skin entry site was identified. Skin site was marked, prepped with Betadine, and draped in usual sterile fashion, and infiltrated locally with 1% lidocaine.  Intravenous Fentanyl and Versed were administered as conscious sedation during continuous cardiorespiratory monitoring by the radiology RN, with a total moderate sedation time of 30 minutes.   Under CT fluoroscopic guidance, gallbladder was accessed using a transhepatic approach with an 18-gauge needle, taking care to avoid overlying bowel loops. A guide wire advanced easily, its  position confirmed on CT.  Advancement of the a 10-French multi side-hole catheter   directly over the guide wire resulted in extraluminal placement of the catheter, presumably secondary to tenting of gallbladder wall.  For this reason, a 15 cm 18 gauge trocar needle was advanced into the gallbladder lumen, positioning confirmed on CT.  Guide wire advanced easily within the lumen of the gallbladder.  The tract was dilated with  a 10-French dilator, which facilitated advancement of a 10-French   multi side-hole pigtail catheter into the gallbladder lumen.  Position was confirmed on CT fluoroscopy, and 20 ml of dark brown viscous bile were aspirated, confirming intraluminal placement.  The aspirate was sent for Gram stain, culture and sensitivity.   Catheter secured externally with 0 Prolene suture and placed to external drain bag. Patient tolerated the procedure well, with no immediate complication.  IMPRESSION:  1. Technically successful percutaneous cholecystostomy tube placement with CT guidance.  Original Report Authenticated By: Osa Craver, M.D.   US Abdomen Port  10/07/2011  *RADIOLOGY REPORT*  Clinical Data: Abdominal pain gallbladder disease  ULTRASOUND PORTABLE ABDOMEN  Technique:  Complete abdominal ultrasound examination was performed including evaluation of the liver, gallbladder, bile ducts, pancreas, kidneys, spleen, IVC, and abdominal aorta.  Comparison: CT chest abdomen pelvis 10/06/2011  Findings:  Gallbladder:  A few gallstones are present.  The largest measures approximately 16 mm.  Gallbladder wall thickness is normal, measures 2.1 to 2.6 mm.  The sonographic Murphy's sign is negative.  Common Bile Duct:  Measures 5 mm where visualized, within normal limits for caliber.  Liver: Right lobe of  the liver is difficult to evaluate due to bowel gas from adjacent colon.  The imaged portion of the liver shows a 1.3 centimeter simple cyst in the left lobe.  No suspicious hepatic lesion.  IVC:   Appears normal.  Pancreas:  Although the pancreas is difficult to visualize in its entirety, no focal pancreatic abnormality is identified.  Spleen:  Normal size and echotexture without focal parenchymal abnormality.  Right kidney:  Status post right nephrectomy.  Left kidney:  Left kidney measures 12.9 cm in sagittal length. There is no hydronephrosis.  No evidence of renal mass.  Abdominal Aorta:  No aneurysm identified.  IMPRESSION:  1.  Cholelithiasis.  The gallbladder is distended. 2.  Negative for/no evidence of acute cholecystitis. 3.  Right nephrectomy.  Left kidney within normal limits.  Original Report Authenticated By: Britta Mccreedy, M.D.   Ct Angio Abd/pel W/ And/or W/o  10/06/2011  *RADIOLOGY REPORT*  Clinical Data:  Chest pain  CT ANGIOGRAPHY CHEST, ABDOMEN AND PELVIS  Technique:  Multidetector CT imaging through the chest, abdomen and pelvis was performed using the standard protocol during bolus administration of intravenous contrast.  Multiplanar reconstructed images including MIPs were obtained and reviewed to evaluate the vascular anatomy.  Contrast: OMNIPAQUE IOHEXOL 350 MG/ML IV SOLN  Comparison:   None.  CTA CHEST  Findings:  The noncontrast scan shows no hyperdense hematoma, mediastinal fluid, or pericardial effusion.  Patchy coronary and aortic calcifications.  No aortic dissection, aneurysm, or stenosis.  Classic three-vessel brachiocephalic arterial origin anatomy without proximal stenosis.  There is incomplete opacification of the pulmonary arterial tree which is grossly unremarkable; exam was not optimized for detection of pulmonary emboli.  No pleural effusion.  No hilar or mediastinal adenopathy.  There is some dependent atelectasis or scarring posteriorly in the lung bases. Mild spondylitic changes in the lower thoracic spine.   Review of the MIP images confirms the above findings.  IMPRESSION:  1.  Negative for acute PE or thoracic aortic dissection. 2.  Patchy coronary and  aortic calcifications.  CTA ABDOMEN AND PELVIS  Findings: Abdominal aorta:  Patchy calcified plaque without dissection, aneurysm, or stenosis.  Celiac axis:  Widely patent  Superior mesenteric artery:  Widely patent, with classic distal branching  Left renal artery:  There are two, the inferior dominant.  Both are widely patent.  Right renal artery:  Ligated proximally, post nephrectomy.  Inferior mesenteric artery:  Origin stenosis related to aortic wall plaque, patent distally  Bifurcation:  Partially calcified plaque without aneurysm or stenosis.  Left iliac: Mild tortuosity, with mild plaque in the proximal common iliac.  No   dissection, aneurysm, or stenosis. Common femoral artery and visualized portions of the proximal SFA and profunda femoris widely patent.  Right iliac:  Mild tortuosity without dissection, aneurysm, or stenosis. Common femoral artery and visualized portions of the proximal SFA and profunda femoris widely patent.  Venous phase:  Not obtained  Nonvascular findings:  The gallbladder is distended with several noncalcified stones suspected in its dependent portion.  Probable small cyst in the lateral left hepatic segment, incompletely characterized.  Unremarkable spleen, adrenal glands.  Small left renal cyst.  Mild pancreatic atrophy without focal lesion.  Small bowel decompressed.  Multiple descending and sigmoid diverticula without adjacent inflammatory/edematous change.  No ascites. Urinary bladder physiologically distended.  Moderate prostatic enlargement with central coarse calcifications.  Bilateral pelvic phleboliths.  No pelvic, retroperitoneal, or mesenteric adenopathy. The portal vein is patent.  Spondylitic changes in the lower  lumbar spine.  Review of the MIP images confirms the above findings.  IMPRESSION:  1.  No abdominal dissection, aneurysm, or significant branch vessel disease. 2.  Distended gallbladder containing stones. 3.  Probable left renal and hepatic cysts, incompletely  characterized. 4.  Descending and sigmoid diverticulosis.  Original Report Authenticated By: Osa Craver, M.D.    Anti-infectives: Anti-infectives     Start     Dose/Rate Route Frequency Ordered Stop   10/06/11 1800   ciprofloxacin (CIPRO) IVPB 400 mg        400 mg 200 mL/hr over 60 Minutes Intravenous Every 12 hours 10/06/11 1703            Assessment/Plan: s/p Procedure(s) (LRB): LEFT HEART CATHETERIZATION WITH CORONARY ANGIOGRAM (N/A) Diet as tolerated.  Will need to go home with drain in place and follow up with [facility] in 3 weeks.  Plan for cholecystectomy when okay with cardiology, optimal timing in 6-8 weeks from surgery standpoint but will need to discuss with cardiologist plan for cholecystectomy.  LOS: 2 days    Lodema Pilot DAVID 10/08/2011

## 2011-10-08 NOTE — Progress Notes (Signed)
CARDIAC REHAB PHASE I   PRE:  Rate/Rhythm: 70 SR    BP: sitting 141/72    SaO2:   MODE:  Ambulation: 700 ft   POST:  Rate/Rhythm: 87 SR    BP: sitting 131/58     SaO2:   Pt doing much better. Denied CP or SOB. Able to walk well with exception of limp due to bad knee. Explained [place] MI and restrictions and diet. Voiced understanding. Will f/u. [id number]  Harriet Masson CES, ACSM

## 2011-10-08 NOTE — Progress Notes (Signed)
Subjective: SUBJECTIVE: 78 y/o male with a PMH of HTN, hyperlipidemia who was transferred from [facility] ER to [facility] for chest/back pain evaluation. He was in his usual state of health up until about 8 pm the night of presentation when he developed pain between his scapula. The pain was sharp, 8/10 in severity, non-radiating and was not associated with shortness of breath, diaphoresis, nausea or vomiting. He presented to [facility] ER for evaluation of his infrascapular pain. While he was at [facility], he developed a sharp pain in the chest. His ECG showed 1 mm ST elevation in leads V1 and V2 and 0.5 mm ST elevation in lead aVL. Based on this presentation, he was transferred to [facility] as a code STEMI.   He was sent to the cath lab where he was found to have angiographically insignificant coronary artery disease with mildly elevated LVEDP. His LV gram showed LV EF of 35% and was consistent with Takotsubo cardiomyopathy.   2D echo on 2/16 Jessye Imhoff thickness as increased in a pattern of moderate LVH. Systolic unction was moderately to severely reduced. The estimated ejection fraction was in the range of 30% to 35%. Regional Albino Bufford motion abnormalities: Akinesis of the mid-apical anterior, mid anteroseptal, mid inferoseptal, apical inferior, mid inferolateral, mid anterolateral, apical septal, apical lateral, and apical myocardium. Distribution of segmental Jefrey Raburn motion abnormalities c/w either ischemic cardiomyopathy or Tako- tsubo cardiomyopathy. Doppler parameters are consistent with abnormal left ventricular relaxation (grade 1 diastolic dysfunction).   S/p IR guided cholecystostomy tube placement day #1.  He is doing well this AM and tolerated diet fine yesterday.  He denies any chest pain, SOB, or abdominal pain.  Passing flatus. Asking when the tube can be taken out.  Objective: Vital signs in last 24 hours: Filed Vitals:   10/08/11 0400 10/08/11 0500 10/08/11 0600 10/08/11 0700    BP: 138/66 128/67 137/61 132/64  Pulse:      Temp: 98.1 F (36.7 C)     TempSrc: Oral     Resp: 13 17 12 13   Height:      Weight:  219 lb 5.7 oz (99.5 kg)    SpO2: 95%      Weight change: -3 lb 12 oz (-1.7 kg)  Intake/Output Summary (Last 24 hours) at 10/08/11 0754 Last data filed at 10/08/11 4098  Gross per 24 hour  Intake   2145 ml  Output   1280 ml  Net    865 ml   Physical Exam: General: NAD, sitting in  Bed comfortably  Lungs: CTA bilaterally. No wheezes or crackles.  Heart: S1, S2, RRR. No murmurs. PMI nondisplaced. No JVD.  Abdomen: Bowel sounds present, slightly hypoactive. Nondistended, nontender, cholecystostomy tube in place with bloody/bilious drainage.  No surrounding erythema around incision site.  Extremities: trace pedal edema Neuro: nonfocal  Lab Results: Basic Metabolic Panel:  Lab 10/08/11 1191 10/07/11 0542  NA 136 137  K 4.2 4.2  CL 103 102  CO2 27 26  GLUCOSE 98 120*  BUN 27* 26*  CREATININE 1.32 1.42*  CALCIUM 9.3 9.8  MG -- --  PHOS -- --   Liver Function Tests:  Lab 10/07/11 0542 10/06/11 0218  AST 16 19  ALT 10 13  ALKPHOS 57 56  BILITOT 0.8 0.3  PROT 6.7 6.4  ALBUMIN 3.4* 3.6    Lab 10/06/11 1633  LIPASE 23  AMYLASE --   CBC:  Lab 10/08/11 0459 10/07/11 0542  WBC 7.8 9.7  NEUTROABS -- --  HGB 12.9* 12.8*  HCT 38.2* 40.4  MCV 89.9 91.6  PLT 127* 131*   Cardiac Enzymes:  Lab 10/06/11 1524 10/06/11 0944 10/06/11 0218  CKTOTAL 77 102 115  CKMB 6.6* 10.0* 10.9*  CKMBINDEX -- -- --  TROPONINI 1.28* 2.68* 3.20*   Fasting Lipid Panel:  Lab 10/06/11 0530  CHOL 143  HDL 40  LDLCALC 41  TRIG 310*  CHOLHDL 3.6  LDLDIRECT --   Thyroid Function Tests:  Lab 10/06/11 0218  TSH 1.751  T4TOTAL --  FREET4 --  T3FREE --  THYROIDAB --   Coagulation:  Lab 10/06/11 0218  LABPROT 13.5  INR 1.01    Micro Results: Recent Results (from the past 240 hour(s))  MRSA PCR SCREENING     Status: Normal   Collection Time    10/06/11 12:45 AM      Component Value Range Status Comment   MRSA by PCR NEGATIVE  NEGATIVE  Final   BODY FLUID CULTURE     Status: Normal (Preliminary result)   Collection Time   10/07/11  1:15 PM      Component Value Range Status Comment   Specimen Description     Final    Value: GALL BLADDER            Lymphocytosis. If a     lymphoproliferative disorder is     in the clinical differential     diagnosis, fresh unrefrigerated   Special Requests PT ON CIPRO   Final    Gram Stain     Final    Value: NO WBC SEEN     NO ORGANISMS SEEN   Culture NO GROWTH 1 DAY   Final    Report Status PENDING   Incomplete    Studies/Results: Ct Angio Chest W/cm &/or Wo Cm  10/06/2011  *RADIOLOGY REPORT*  Clinical Data:  Chest pain  CT ANGIOGRAPHY CHEST, ABDOMEN AND PELVIS  Technique:  Multidetector CT imaging through the chest, abdomen and pelvis was performed using the standard protocol during bolus administration of intravenous contrast.  Multiplanar reconstructed images including MIPs were obtained and reviewed to evaluate the vascular anatomy.  Contrast: OMNIPAQUE IOHEXOL 350 MG/ML IV SOLN  Comparison:   None.  CTA CHEST  Findings:  The noncontrast scan shows no hyperdense hematoma, mediastinal fluid, or pericardial effusion.  Patchy coronary and aortic calcifications.  No aortic dissection, aneurysm, or stenosis.  Classic three-vessel brachiocephalic arterial origin anatomy without proximal stenosis.  There is incomplete opacification of the pulmonary arterial tree which is grossly unremarkable; exam was not optimized for detection of pulmonary emboli.  No pleural effusion.  No hilar or mediastinal adenopathy.  There is some dependent atelectasis or scarring posteriorly in the lung bases. Mild spondylitic changes in the lower thoracic spine.   Review of the MIP images confirms the above findings.  IMPRESSION:  1.  Negative for acute PE or thoracic aortic dissection. 2.  Patchy coronary and aortic  calcifications.  CTA ABDOMEN AND PELVIS  Findings: Abdominal aorta:  Patchy calcified plaque without dissection, aneurysm, or stenosis.  Celiac axis:  Widely patent  Superior mesenteric artery:  Widely patent, with classic distal branching  Left renal artery:  There are two, the inferior dominant.  Both are widely patent.  Right renal artery:  Ligated proximally, post nephrectomy.  Inferior mesenteric artery:  Origin stenosis related to aortic Enma Maeda plaque, patent distally  Bifurcation:  Partially calcified plaque without aneurysm or stenosis.  Left iliac: Mild tortuosity, with mild  plaque in the proximal common iliac.  No   dissection, aneurysm, or stenosis. Common femoral artery and visualized portions of the proximal SFA and profunda femoris widely patent.  Right iliac:  Mild tortuosity without dissection, aneurysm, or stenosis. Common femoral artery and visualized portions of the proximal SFA and profunda femoris widely patent.  Venous phase:  Not obtained  Nonvascular findings:  The gallbladder is distended with several noncalcified stones suspected in its dependent portion.  Probable small cyst in the lateral left hepatic segment, incompletely characterized.  Unremarkable spleen, adrenal glands.  Small left renal cyst.  Mild pancreatic atrophy without focal lesion.  Small bowel decompressed.  Multiple descending and sigmoid diverticula without adjacent inflammatory/edematous change.  No ascites. Urinary bladder physiologically distended.  Moderate prostatic enlargement with central coarse calcifications.  Bilateral pelvic phleboliths.  No pelvic, retroperitoneal, or mesenteric adenopathy. The portal vein is patent.  Spondylitic changes in the lower lumbar spine.  Review of the MIP images confirms the above findings.  IMPRESSION:  1.  No abdominal dissection, aneurysm, or significant branch vessel disease. 2.  Distended gallbladder containing stones. 3.  Probable left renal and hepatic cysts, incompletely  characterized. 4.  Descending and sigmoid diverticulosis.  Original Report Authenticated By: Osa Craver, M.D.   Ct Guided Abscess Drain  10/07/2011   *RADIOLOGY REPORT*  Clinical data: Cholelithiasis with gallbladder distention and pain. Cardiomyopathy.  PERCUTANEOUS CHOLECYSTOSTOMY TUBE PLACEMENT WITH CT GUIDANCE:  Technique: The procedure, risks (including but not limited to bleeding, infection, organ damage), benefits, and alternatives were explained to the patient.  Questions regarding the procedure were encouraged and answered.  The patient understands and consents to the procedure.CT guidance was selected because of colonic interposition noted on recent abdomen CT.Survey scans of the abdomen   performed and an appropriate skin entry site was identified. Skin site was marked, prepped with Betadine, and draped in usual sterile fashion, and infiltrated locally with 1% lidocaine.  Intravenous Fentanyl and Versed were administered as conscious sedation during continuous cardiorespiratory monitoring by the radiology RN, with a total moderate sedation time of 30 minutes.   Under CT fluoroscopic guidance, gallbladder was accessed using a transhepatic approach with an 18-gauge needle, taking care to avoid overlying bowel loops. A guide wire advanced easily, its position confirmed on CT.  Advancement of the a 10-French multi side-hole catheter   directly over the guide wire resulted in extraluminal placement of the catheter, presumably secondary to tenting of gallbladder Malin Cervini.  For this reason, a 15 cm 18 gauge trocar needle was advanced into the gallbladder lumen, positioning confirmed on CT.  Guide wire advanced easily within the lumen of the gallbladder.  The tract was dilated with  a 10-French dilator, which facilitated advancement of a 10-French   multi side-hole pigtail catheter into the gallbladder lumen.  Position was confirmed on CT fluoroscopy, and 20 ml of dark brown viscous bile were aspirated,  confirming intraluminal placement.  The aspirate was sent for Gram stain, culture and sensitivity.   Catheter secured externally with 0 Prolene suture and placed to external drain bag. Patient tolerated the procedure well, with no immediate complication.  IMPRESSION:  1. Technically successful percutaneous cholecystostomy tube placement with CT guidance.  Original Report Authenticated By: Osa Craver, M.D.   US Abdomen Port  10/07/2011  *RADIOLOGY REPORT*  Clinical Data: Abdominal pain gallbladder disease  ULTRASOUND PORTABLE ABDOMEN  Technique:  Complete abdominal ultrasound examination was performed including evaluation of the liver, gallbladder, bile ducts, pancreas,  kidneys, spleen, IVC, and abdominal aorta.  Comparison: CT chest abdomen pelvis 10/06/2011  Findings:  Gallbladder:  A few gallstones are present.  The largest measures approximately 16 mm.  Gallbladder Leif Loflin thickness is normal, measures 2.1 to 2.6 mm.  The sonographic Murphy's sign is negative.  Common Bile Duct:  Measures 5 mm where visualized, within normal limits for caliber.  Liver: Right lobe of the liver is difficult to evaluate due to bowel gas from adjacent colon.  The imaged portion of the liver shows a 1.3 centimeter simple cyst in the left lobe.  No suspicious hepatic lesion.  IVC:  Appears normal.  Pancreas:  Although the pancreas is difficult to visualize in its entirety, no focal pancreatic abnormality is identified.  Spleen:  Normal size and echotexture without focal parenchymal abnormality.  Right kidney:  Status post right nephrectomy.  Left kidney:  Left kidney measures 12.9 cm in sagittal length. There is no hydronephrosis.  No evidence of renal mass.  Abdominal Aorta:  No aneurysm identified.  IMPRESSION:  1.  Cholelithiasis.  The gallbladder is distended. 2.  Negative for/no evidence of acute cholecystitis. 3.  Right nephrectomy.  Left kidney within normal limits.  Original Report Authenticated By: Britta Mccreedy,  M.D.   Ct Angio Abd/pel W/ And/or W/o  10/06/2011  *RADIOLOGY REPORT*  Clinical Data:  Chest pain  CT ANGIOGRAPHY CHEST, ABDOMEN AND PELVIS  Technique:  Multidetector CT imaging through the chest, abdomen and pelvis was performed using the standard protocol during bolus administration of intravenous contrast.  Multiplanar reconstructed images including MIPs were obtained and reviewed to evaluate the vascular anatomy.  Contrast: OMNIPAQUE IOHEXOL 350 MG/ML IV SOLN  Comparison:   None.  CTA CHEST  Findings:  The noncontrast scan shows no hyperdense hematoma, mediastinal fluid, or pericardial effusion.  Patchy coronary and aortic calcifications.  No aortic dissection, aneurysm, or stenosis.  Classic three-vessel brachiocephalic arterial origin anatomy without proximal stenosis.  There is incomplete opacification of the pulmonary arterial tree which is grossly unremarkable; exam was not optimized for detection of pulmonary emboli.  No pleural effusion.  No hilar or mediastinal adenopathy.  There is some dependent atelectasis or scarring posteriorly in the lung bases. Mild spondylitic changes in the lower thoracic spine.   Review of the MIP images confirms the above findings.  IMPRESSION:  1.  Negative for acute PE or thoracic aortic dissection. 2.  Patchy coronary and aortic calcifications.  CTA ABDOMEN AND PELVIS  Findings: Abdominal aorta:  Patchy calcified plaque without dissection, aneurysm, or stenosis.  Celiac axis:  Widely patent  Superior mesenteric artery:  Widely patent, with classic distal branching  Left renal artery:  There are two, the inferior dominant.  Both are widely patent.  Right renal artery:  Ligated proximally, post nephrectomy.  Inferior mesenteric artery:  Origin stenosis related to aortic Roczen Waymire plaque, patent distally  Bifurcation:  Partially calcified plaque without aneurysm or stenosis.  Left iliac: Mild tortuosity, with mild plaque in the proximal common iliac.  No   dissection,  aneurysm, or stenosis. Common femoral artery and visualized portions of the proximal SFA and profunda femoris widely patent.  Right iliac:  Mild tortuosity without dissection, aneurysm, or stenosis. Common femoral artery and visualized portions of the proximal SFA and profunda femoris widely patent.  Venous phase:  Not obtained  Nonvascular findings:  The gallbladder is distended with several noncalcified stones suspected in its dependent portion.  Probable small cyst in the lateral left hepatic segment, incompletely characterized.  Unremarkable spleen, adrenal glands.  Small left renal cyst.  Mild pancreatic atrophy without focal lesion.  Small bowel decompressed.  Multiple descending and sigmoid diverticula without adjacent inflammatory/edematous change.  No ascites. Urinary bladder physiologically distended.  Moderate prostatic enlargement with central coarse calcifications.  Bilateral pelvic phleboliths.  No pelvic, retroperitoneal, or mesenteric adenopathy. The portal vein is patent.  Spondylitic changes in the lower lumbar spine.  Review of the MIP images confirms the above findings.  IMPRESSION:  1.  No abdominal dissection, aneurysm, or significant branch vessel disease. 2.  Distended gallbladder containing stones. 3.  Probable left renal and hepatic cysts, incompletely characterized. 4.  Descending and sigmoid diverticulosis.  Original Report Authenticated By: Osa Craver, M.D.   Medications:  Scheduled Meds:   . aspirin EC  81 mg Oral Daily  . carvedilol  3.125 mg Oral BID WC  . ciprofloxacin  400 mg Intravenous Q12H  . fentaNYL      . furosemide  20 mg Oral BID  . lisinopril  20 mg Oral Daily  . midazolam      . mulitivitamin with minerals  1 tablet Oral [id number]  . rosuvastatin  10 mg Oral Daily   Continuous Infusions:   . sodium chloride 50 mL/hr at 10/08/11 6578  . nitroGLYCERIN Stopped (10/06/11 1900)   PRN Meds:.acetaminophen, ALPRAZolam, fentaNYL, HYDROmorphone (DILAUDID)  injection, midazolam, morphine, nitroGLYCERIN, ondansetron (ZOFRAN) IV, oxyCODONE-acetaminophen Assessment/Plan: 1) STEMI: 1 mm ST elevation in V1 and V2 and half mm elevation in lead aVL. ST depression in inferior leads and presentation. Cardiac cath done by Dr. [place]- shows no severe CAD, but findings consistent with Takotsubo cardiomyopathy- 2-D echo confirms the findings.  - Continue beta blocker, aspirin, statin, ACE inhibitor.   2) Takotsubo cardiomyopathy: Consistent findings per cardiac cath and 2-D echo.  - Continue management as #1   3) Acute cholecystitis: Management per surgery.  - Ultrasound abdomen: Stones in gallbladder neck. Right nephrectomy.  - Not a candidate for cholecystectomy present due to cardiac event and possible acute cholecystitis.  - s/p IR guided cholecystostomy tube placement day #1   4) Worsening renal function: Creatinine 1.14-->1.42-->1.32, improving  - Most likely due to cardiac cath.  - Will follow with BMP.   Dispo: transfer to telemetry    LOS: 2 days   HO,MICHELE 10/08/2011, 7:54 AM  Patient examined and agree . OK to transfer to Telemetry. Cardiac Rehab.  Valera Castle, MD 10/08/2011 10:30 AM

## 2011-10-08 NOTE — Progress Notes (Signed)
UR Completed. Darius Baxter, Darius Baxter [phone]  

## 2011-10-09 MED ORDER — CIPROFLOXACIN HCL 500 MG PO TABS
500.0000 mg | ORAL_TABLET | Freq: Two times a day (BID) | ORAL | Status: DC
  Administered 2011-10-09 – 2011-10-10 (×2): 500 mg via ORAL
  Filled 2011-10-09 (×4): qty 1

## 2011-10-09 MED ORDER — CARVEDILOL 6.25 MG PO TABS
6.2500 mg | ORAL_TABLET | Freq: Two times a day (BID) | ORAL | Status: DC
  Administered 2011-10-09 – 2011-10-10 (×2): 6.25 mg via ORAL
  Filled 2011-10-09 (×4): qty 1

## 2011-10-09 NOTE — Progress Notes (Signed)
[id number] Cardiac Rehab Completed discharge education with pt. He agrees to [facility]. CRP in [place], will send referral.

## 2011-10-09 NOTE — Progress Notes (Signed)
Patient ID: Darius Baxter, male   DOB: February 12, 1934, 78 y.o.   MRN: [id number] Subjective:  No chest pain. No nausea or vomiting.  Objective:  Vital Signs in the last 24 hours: Temp:  [97.9 F (36.6 C)-98.1 F (36.7 C)] 98 F (36.7 C) (02/19 1400) Pulse Rate:  [55-64] 55  (02/19 1400) Resp:  [18-20] 20  (02/19 1400) BP: (136-163)/(69-80) 154/80 mmHg (02/19 1400) SpO2:  [94 %-96 %] 95 % (02/19 1400) Weight:  [93 kg (205 lb 0.4 oz)] 93 kg (205 lb 0.4 oz) (02/19 0500)  Intake/Output from previous day: 02/18 0701 - 02/19 0700 In: 760 [P.O.:270; I.V.:290; IV Piggyback:200] Out: 550 [Urine:550] Intake/Output from this shift: Total I/O In: 720 [P.O.:720] Out: -   Physical Exam: Well appearing, NAD HEENT: Unremarkable Neck:  No JVD, no thyromegally Lymphatics:  No adenopathy Back:  No CVA tenderness Lungs:  Clear with no wheezes. HEART:  Regular rate rhythm, no murmurs, no rubs, no clicks Abd:  Flat, soft, positive bowel sounds, no organomegally, no rebound, no guarding Ext:  2 plus pulses, no edema, no cyanosis, no clubbing Skin:  No rashes no nodules Neuro:  CN II through XII intact, motor grossly intact  Lab Results:  Basename 10/08/11 0459 10/07/11 0542  WBC 7.8 9.7  HGB 12.9* 12.8*  PLT 127* 131*    Basename 10/08/11 0459 10/07/11 0542  NA 136 137  K 4.2 4.2  CL 103 102  CO2 27 26  GLUCOSE 98 120*  BUN 27* 26*  CREATININE 1.32 1.42*   No results found for this basename: TROPONINI:2,CK,MB:2 in the last 72 hours Hepatic Function Panel  Basename 10/07/11 0542  PROT 6.7  ALBUMIN 3.4*  AST 16  ALT 10  ALKPHOS 57  BILITOT 0.8  BILIDIR --  IBILI --   No results found for this basename: CHOL in the last 72 hours No results found for this basename: PROTIME in the last 72 hours  Imaging: No results found.  Cardiac Studies: Tele - nsr with pvc's. Assessment/Plan:  1. Acute cholecystitis - s/p drain doing well. Would anticipate proceeding with surgery in  the next 4-6 weeks. 2. Takasubo cardiomyopathy - stable overnight. He can be discharged tomorrow on current medical therapy as long as he remains stable overnight.   LOS: 3 days    Darius Baxter 10/09/2011, 5:17 PM

## 2011-10-09 NOTE — Progress Notes (Signed)
Pharmacy Consult - Cipro  Acute cholecystitis Afebrile Scr stable  Plan: 1) Change Cipro to 500 mg po Q 12 hours 2) Pharmacy will sign off.  Thank you. Okey Regal, PharmD

## 2011-10-09 NOTE — Progress Notes (Signed)
Cardiology Progress Note Patient Name: Darius Baxter Date of Encounter: 10/09/2011, 6:53 AM     Subjective  No overnight events. Patient doing well without complaints of chest/back/abd pain or shortness of breath. Was able to ambulate with cardiac rehab yesterday without difficulty.    Objective   Telemetry: sinus rhythm 50s-70s, PVCs  Medications: . aspirin EC  81 mg Oral Daily  . carvedilol  3.125 mg Oral BID WC  . ciprofloxacin  400 mg Intravenous Q12H  . furosemide  20 mg Oral BID  . lisinopril  20 mg Oral Daily  . mulitivitamin with minerals  1 tablet Oral BH-q7a  . mulitivitamin with minerals  1 tablet Oral Daily  . rosuvastatin  10 mg Oral Daily   . sodium chloride 50 mL/hr at 10/08/11 1200    Physical Exam: Temp:  [97.7 F (36.5 C)-98.2 F (36.8 C)] 98.1 F (36.7 C) (02/19 0500) Pulse Rate:  [61-81] 64  (02/19 0500) Resp:  [13-20] 18  (02/19 0500) BP: (131-172)/(58-97) 144/69 mmHg (02/19 0500) SpO2:  [94 %-97 %] 96 % (02/19 0500) Weight:  [205 lb 0.4 oz (93 kg)] 205 lb 0.4 oz (93 kg) (02/19 0500)  General: Well developed, well nourished, white male in no acute distress. Head: Normocephalic, atraumatic, sclera non-icteric, nares are without discharge.  Neck: Supple. Negative for carotid bruits or JVD Lungs: Clear bilaterally to auscultation without wheezes, rales, or rhonchi. Breathing is unlabored. Heart: RRR S1 S2 without murmurs, rubs, or gallops.  Abdomen: Soft, tender at drain site, non-distended with normoactive bowel sounds. No rebound/guarding. No obvious abdominal masses. Msk:  Strength and tone appear normal for age. Extremities: Trace bilat pretibial edema. No clubbing or cyanosis. Distal pedal pulses are 2+ and equal bilaterally. Neuro: Alert and oriented X 3. Moves all extremities spontaneously. Psych:  Responds to questions appropriately with a normal affect.   Intake/Output Summary (Last 24 hours) at 10/09/11 0653 Last data filed at  10/08/11 2140  Gross per 24 hour  Intake    770 ml  Output    550 ml  Net    220 ml    Labs:  [facility] 10/08/11 0459 10/07/11 0542  NA 136 137  K 4.2 4.2  CL 103 102  CO2 27 26  GLUCOSE 98 120*  BUN 27* 26*  CREATININE 1.32 1.42*  CALCIUM 9.3 9.8   Basename 10/07/11 0542  AST 16  ALT 10  ALKPHOS 57  BILITOT 0.8  PROT 6.7  ALBUMIN 3.4*   Basename 10/06/11 1633  LIPASE 23   Basename 10/08/11 0459 10/07/11 0542  WBC 7.8 9.7  HGB 12.9* 12.8*  HCT 38.2* 40.4  MCV 89.9 91.6  PLT 127* 131*    10/06/2011 02:18 10/06/2011 09:44 10/06/2011 15:24  CK, MB 10.9 (HH) 10.0 (HH) 6.6 (HH)  CK Total 115 102 77  Troponin I 3.20 (HH) 2.68 (HH) 1.28 (HH)     10/06/2011 05:30  Cholesterol 143  Triglycerides 310 (H)  HDL 40  LDL (calc) 41  VLDL 62 (H)  Total CHOL/HDL Ratio 3.6     10/06/2011 02:18  TSH 1.751   Radiology/Studies:   2D echo - 10/06/11  Study Conclusions:   - Left ventricle: The cavity size was normal. Wall thickness was increased in a pattern of moderate LVH. Systolic function was moderately to severely reduced. The estimated ejection fraction was in the range of 30% to 35%. Doppler parameters are consistent with abnormal left ventricular relaxation (grade 1  diastolic dysfunction). - Regional wall motion abnormality: Akinesis of the mid-apical anterior, mid anteroseptal, mid inferoseptal, apical inferior, mid inferolateral, mid anterolateral, apical septal, apical lateral, and apical myocardium. Distribution of segmental wall motion abnormalities c/w either ischemic cardiomyopathy or Tako- tsubo cardiomyopathy. - Right ventricle: Akinesis distal RV free wall. The cavity size was mildly dilated. Systolic function was normal.  - Atrial septum: No defect or patent foramen ovale was identified. - Pulmonary arteries: PA peak pressure: 39mm Hg (S). - Inferior vena cava: The vessel was dilated; the respirophasic diameter changes were blunted (< 50%); findings are consistent  with elevated central venous pressure.   Cardiac Cath 10/05/2011:  Hemodynamics:  AO 108/75 with a mean of 90 mmHg  LV 104/34 mmHg  Coronary angiography:  Coronary dominance: right  Left mainstem: Normal  Left anterior descending (LAD): 20-30% irregularities in the proximal vessel. This is a large vessel which extends around the apex. No obstructive disease is noted.  There is a large ramus intermediate branch which has 20% narrowing at the ostium.  Left circumflex (LCx): 20-30% narrowing at the ostium.  Right coronary artery (RCA): Normal.  Left ventriculography: Left ventricular size is increased. There is severe hypokinesis to akinesis of the mid to distal anterior wall, apex, and mid to distal inferior wall with overall moderately severe LV dysfunction. Ejection fraction is estimated at 35%.  Final Conclusions:  1. Nonobstructive coronary disease.  2. Moderate to severe left ventricular dysfunction with characteristic appearance of Takotsubo cardiomyopathy.  Recommendations: Medical management.  10/06/2011  - CTA Chest Findings:  The noncontrast scan shows no hyperdense hematoma, mediastinal fluid, or pericardial effusion.  Patchy coronary and aortic calcifications.  No aortic dissection, aneurysm, or stenosis.  Classic three-vessel brachiocephalic arterial origin anatomy without proximal stenosis.  There is incomplete opacification of the pulmonary arterial tree which is grossly unremarkable; exam was not optimized for detection of pulmonary emboli.  No pleural effusion.  No hilar or mediastinal adenopathy.  There is some dependent atelectasis or scarring posteriorly in the lung bases. Mild spondylitic changes in the lower thoracic spine.   Review of the MIP images confirms the above findings.  IMPRESSION:  1.  Negative for acute PE or thoracic aortic dissection. 2.  Patchy coronary and aortic calcifications.   CTA ABDOMEN AND PELVIS   Findings: Abdominal aorta:  Patchy calcified plaque  without dissection, aneurysm, or stenosis.  Celiac axis:  Widely patent  Superior mesenteric artery:  Widely patent, with classic distal branching  Left renal artery:  There are two, the inferior dominant.  Both are widely patent.  Right renal artery:  Ligated proximally, post nephrectomy.  Inferior mesenteric artery:  Origin stenosis related to aortic wall plaque, patent distally  Bifurcation:  Partially calcified plaque without aneurysm or stenosis.  Left iliac: Mild tortuosity, with mild plaque in the proximal common iliac.  No   dissection, aneurysm, or stenosis. Common femoral artery and visualized portions of the proximal SFA and profunda femoris widely patent.  Right iliac:  Mild tortuosity without dissection, aneurysm, or stenosis. Common femoral artery and visualized portions of the proximal SFA and profunda femoris widely patent.  Venous phase:  Not obtained  Nonvascular findings:  The gallbladder is distended with several noncalcified stones suspected in its dependent portion.  Probable small cyst in the lateral left hepatic segment, incompletely characterized.  Unremarkable spleen, adrenal glands.  Small left renal cyst.  Mild pancreatic atrophy without focal lesion.  Small bowel decompressed.  Multiple descending and sigmoid diverticula without  adjacent inflammatory/edematous change.  No ascites. Urinary bladder physiologically distended.  Moderate prostatic enlargement with central coarse calcifications.  Bilateral pelvic phleboliths.  No pelvic, retroperitoneal, or mesenteric adenopathy. The portal vein is patent.  Spondylitic changes in the lower lumbar spine.  Review of the MIP images confirms the above findings.  IMPRESSION:  1.  No abdominal dissection, aneurysm, or significant branch vessel disease. 2.  Distended gallbladder containing stones. 3.  Probable left renal and hepatic cysts, incompletely characterized. 4.  Descending and sigmoid diverticulosis.    10/07/2011 - Abd US Findings:   Gallbladder:  A few gallstones are present.  The largest measures approximately 16 mm.  Gallbladder wall thickness is normal, measures 2.1 to 2.6 mm.  The sonographic Murphy's sign is negative.  Common Bile Duct:  Measures 5 mm where visualized, within normal limits for caliber.  Liver: Right lobe of the liver is difficult to evaluate due to bowel gas from adjacent colon.  The imaged portion of the liver shows a 1.3 centimeter simple cyst in the left lobe.  No suspicious hepatic lesion.  IVC:  Appears normal.  Pancreas:  Although the pancreas is difficult to visualize in its entirety, no focal pancreatic abnormality is identified.  Spleen:  Normal size and echotexture without focal parenchymal abnormality.  Right kidney:  Status post right nephrectomy.  Left kidney:  Left kidney measures 12.9 cm in sagittal length. There is no hydronephrosis.  No evidence of renal mass.  Abdominal Aorta:  No aneurysm identified.  IMPRESSION:  1.  Cholelithiasis.  The gallbladder is distended. 2.  Negative for/no evidence of acute cholecystitis. 3.  Right nephrectomy.  Left kidney within normal limits.    10/07/2011   PERCUTANEOUS CHOLECYSTOSTOMY TUBE PLACEMENT WITH CT GUIDANCE Under CT fluoroscopic guidance, gallbladder was accessed using a transhepatic approach with an 18-gauge needle, taking care to avoid overlying bowel loops. A guide wire advanced easily, its position confirmed on CT.  Advancement of the a 10-French multi side-hole catheter   directly over the guide wire resulted in extraluminal placement of the catheter, presumably secondary to tenting of gallbladder wall.  For this reason, a 15 cm 18 gauge trocar needle was advanced into the gallbladder lumen, positioning confirmed on CT.  Guide wire advanced easily within the lumen of the gallbladder.  The tract was dilated with  a 10-French dilator, which facilitated advancement of a 10-French   multi side-hole pigtail catheter into the gallbladder lumen.  Position was  confirmed on CT fluoroscopy, and 20 ml of dark brown viscous bile were aspirated, confirming intraluminal placement.  The aspirate was sent for Gram stain, culture and sensitivity.   Catheter secured externally with 0 Prolene suture and placed to external drain bag. Patient tolerated the procedure well, with no immediate complication.  IMPRESSION:  1. Technically successful percutaneous cholecystostomy tube placement with CT guidance.     Assessment and Plan  78 y.o. male w/ PMHx significant for HTN & HLD who was transferred to [facility] from [facility] on 10/06/11 for a STEMI but found to have no significantly obstructive CAD by cath w/ EF 35% concerning for Takotsubo cardiomyopathy confirmed with echo. CT revealed distended gallbladder with stones for which he underwent percutaneous cholecystostomy tube placement on 10/07/11.  1. STEMI: 1 mm ST elevation in V1 and V2 and half mm elevation in lead aVL. ST depression in inferior leads and presentation. Cardiac cath done by Dr. Swaziland- shows no severe CAD, but findings consistent with Takotsubo cardiomyopathy- 2-D echo  confirms the findings. Troponins trended down. No active chest pain. Cont ASA, BB, ACEI, statin  2. Takotsubo CM: Consistent findings per cardiac cath and 2-D echo. Continue management as #1. Euvolemic on Lasix 20mg  BID.  3. Cholelithiasis vs Acute Cholecystitis: CT revealed distended gallbladder with stones. Not a candidate for cholecystectomy due to cardiac event. Now s/p percutaneous cholecystostomy tube placement day #2. When ready for dc will need to go home with cholecystostomy drain in place and f/u with [facility] in 3wks with plans for cholecystectomy when ok with cardiology, optimally in 6-8wks from surgical standpoint. On Cipro day #4. Afebrile with normal WBC.   4. Acute on Chronic Renal Insufficiency: Likely 2/2 cardiac cath. Improved. Crt 1.32 yesterday.  5. Left Renal & Hepatic Cysts by CT: Need outpatient follow up  6.  Hypertension: SBPs 130s-170s. On Lisinopril 20mg  & lasix 20mg  bid. Increase Coreg 6.25mg . Consider uptitration of Lisinopril.  7. Hyperlipidemia: LDL 41. LFTs normal. Cont statin  VTE prophylaxis: SCDs  Disposition: Surgery please advise of Home Health needs in regard to drain maintanence  Signed, Illyria Sobocinski PA-C

## 2011-10-09 NOTE — Progress Notes (Signed)
   CARE MANAGEMENT NOTE 10/09/2011  Patient:  Darius Baxter, Darius Baxter   Account Number:  [id number]  Date Initiated:  10/09/2011  Documentation initiated by:  GRAVES-BIGELOW,Arlayne Liggins  Subjective/Objective Assessment:   Pt admitted with chest and back pain.  IR guided cholecystostomy tube placement. CM received referral for [facility] RN. Pt is from home with wife.     Action/Plan:   MD did come in and stated that Plan for discharge when stable heart.  No maintenance needed for the drain.  Just record drain output daily and empty PRN   Anticipated DC Date:  10/09/2011   Anticipated DC Plan:  HOME W HOME HEALTH SERVICES      DC Planning Services  CM consult      Choice offered to / List presented to:             Status of service:  Completed, signed off Medicare Important Message given?   (If response is "NO", the following Medicare IM given date fields will be blank) Date Medicare IM given:   Date Additional Medicare IM given:    Discharge Disposition:  HOME/SELF CARE  Per UR Regulation:    Comments:  No home health services needed at this time per pt and MD notes.

## 2011-10-09 NOTE — Progress Notes (Signed)
Subjective: Afebrile, 180 ml from drain 2/17, none recorded yesterday,irrigated and working now, clear, yelllow-brown serous fluid.  Objective: Vital signs in last 24 hours: Temp:  [97.9 F (36.6 C)-98.2 F (36.8 C)] 98.1 F (36.7 C) (02/19 0500) Pulse Rate:  [61-66] 64  (02/19 0500) Resp:  [13-20] 18  (02/19 0500) BP: (131-172)/(58-97) 136/74 mmHg (02/19 0948) SpO2:  [94 %-97 %] 96 % (02/19 0500) Weight:  [93 kg (205 lb 0.4 oz)] 93 kg (205 lb 0.4 oz) (02/19 0500) Last BM Date: 10/08/11  Intake/Output from previous day: 02/18 0701 - 02/19 0700 In: 760 [P.O.:270; I.V.:290; IV Piggyback:200] Out: 550 [Urine:550] Intake/Output this shift: Total I/O In: 360 [P.O.:360] Out: -   ZO:XWRUE, up in chair , Chest clear, no pain. Abd: soft tolerating diet, no pain, no fever. Yellow-brown clear fluid from drain.  Lab Results:   [facility] 10/08/11 0459 10/07/11 0542  WBC 7.8 9.7  HGB 12.9* 12.8*  HCT 38.2* 40.4  PLT 127* 131*    BMET  Basename 10/08/11 0459 10/07/11 0542  NA 136 137  K 4.2 4.2  CL 103 102  CO2 27 26  GLUCOSE 98 120*  BUN 27* 26*  CREATININE 1.32 1.42*  CALCIUM 9.3 9.8    Lab 10/07/11 0542 10/06/11 0218  AST 16 19  ALT 10 13  ALKPHOS 57 56  BILITOT 0.8 0.3  PROT 6.7 6.4  ALBUMIN 3.4* 3.6     PT/INR No results found for this basename: LABPROT:2,INR:2 in the last 72 hours   Studies/Results: Ct Guided Abscess Drain  10/07/2011   *RADIOLOGY REPORT*  Clinical data: Cholelithiasis with gallbladder distention and pain. Cardiomyopathy.  PERCUTANEOUS CHOLECYSTOSTOMY TUBE PLACEMENT WITH CT GUIDANCE:  Technique: The procedure, risks (including but not limited to bleeding, infection, organ damage), benefits, and alternatives were explained to the patient.  Questions regarding the procedure were encouraged and answered.  The patient understands and consents to the procedure.CT guidance was selected because of colonic interposition noted on recent abdomen  CT.Survey scans of the abdomen   performed and an appropriate skin entry site was identified. Skin site was marked, prepped with Betadine, and draped in usual sterile fashion, and infiltrated locally with 1% lidocaine.  Intravenous Fentanyl and Versed were administered as conscious sedation during continuous cardiorespiratory monitoring by the radiology RN, with a total moderate sedation time of 30 minutes.   Under CT fluoroscopic guidance, gallbladder was accessed using a transhepatic approach with an 18-gauge needle, taking care to avoid overlying bowel loops. A guide wire advanced easily, its position confirmed on CT.  Advancement of the a 10-French multi side-hole catheter   directly over the guide wire resulted in extraluminal placement of the catheter, presumably secondary to tenting of gallbladder wall.  For this reason, a 15 cm 18 gauge trocar needle was advanced into the gallbladder lumen, positioning confirmed on CT.  Guide wire advanced easily within the lumen of the gallbladder.  The tract was dilated with  a 10-French dilator, which facilitated advancement of a 10-French   multi side-hole pigtail catheter into the gallbladder lumen.  Position was confirmed on CT fluoroscopy, and 20 ml of dark brown viscous bile were aspirated, confirming intraluminal placement.  The aspirate was sent for Gram stain, culture and sensitivity.   Catheter secured externally with 0 Prolene suture and placed to external drain bag. Patient tolerated the procedure well, with no immediate complication.  IMPRESSION:  1. Technically successful percutaneous cholecystostomy tube placement with CT guidance.  Original Report Authenticated By:  D. DANIEL HASSELL III, M.D.    Anti-infectives: Anti-infectives     Start     Dose/Rate Route Frequency Ordered Stop   10/09/11 2000   ciprofloxacin (CIPRO) tablet 500 mg        500 mg Oral 2 times daily 10/09/11 0945     10/06/11 1800   ciprofloxacin (CIPRO) IVPB 400 mg  Status:   Discontinued        400 mg 200 mL/hr over 60 Minutes Intravenous Every 12 hours 10/06/11 1703 10/09/11 0944         Current Facility-Administered Medications  Medication Dose Route Frequency Provider Last Rate Last Dose  . acetaminophen (TYLENOL) tablet 650 mg  650 mg Oral Q4H PRN Peter Swaziland, MD      . ALPRAZolam Prudy Feeler) tablet 0.25 mg  0.25 mg Oral Q4H PRN Cassell Clement, MD   0.25 mg at 10/08/11 2145  . aspirin EC tablet 81 mg  81 mg Oral Daily Peter Swaziland, MD   81 mg at 10/09/11 0948  . carvedilol (COREG) tablet 6.25 mg  6.25 mg Oral BID WC [facility], PA-C      . ciprofloxacin (CIPRO) tablet 500 mg  500 mg Oral BID Peter Swaziland, MD      . furosemide (LASIX) tablet 20 mg  20 mg Oral BID Lyn Hollingshead, MD   20 mg at 10/09/11 0803  . lisinopril (PRINIVIL,ZESTRIL) tablet 20 mg  20 mg Oral Daily Peter Swaziland, MD   20 mg at 10/09/11 0948  . mulitivitamin with minerals tablet 1 tablet  1 tablet Oral Daily Carrolyn Meiers, MD   1 tablet at 10/09/11 [phone]  . nitroGLYCERIN (NITROSTAT) SL tablet 0.4 mg  0.4 mg Sublingual Q5 Min x 3 PRN Peter Swaziland, MD      . ondansetron [facility]) injection 4 mg  4 mg Intravenous Q6H PRN Peter Swaziland, MD   4 mg at 10/07/11 0555  . oxyCODONE-acetaminophen (PERCOCET) 5-325 MG per tablet 1-2 tablet  1-2 tablet Oral Q4H PRN Peter Swaziland, MD   2 tablet at 10/06/11 1954  . rosuvastatin (CRESTOR) tablet 10 mg  10 mg Oral Daily Peter Swaziland, MD   10 mg at 10/09/11 0949  . DISCONTD: 0.9 %  sodium chloride infusion   Intravenous Continuous Thomas A. Cornett, MD      . DISCONTD: carvedilol (COREG) tablet 3.125 mg  3.125 mg Oral BID WC Peter Swaziland, MD   3.125 mg at 10/09/11 0803  . DISCONTD: ciprofloxacin (CIPRO) IVPB 400 mg  400 mg Intravenous Q12H Peter Swaziland, MD   400 mg at 10/09/11 0647  . DISCONTD: HYDROmorphone (DILAUDID) injection 1 mg  1 mg Intravenous Q2H PRN Thomas A. Cornett, MD   1 mg at 10/07/11 1354  . DISCONTD: morphine 2 MG/ML injection 2 mg  2 mg Intravenous  Q1H PRN Peter Swaziland, MD   2 mg at 10/06/11 1437  . DISCONTD: mulitivitamin with minerals tablet 1 tablet  1 tablet Oral BH-q7a Peter Swaziland, MD   1 tablet at 10/08/11 0800  . DISCONTD: nitroGLYCERIN 0.2 mg/mL in dextrose 5 % infusion  2-200 mcg/min Intravenous Titrated Peter Swaziland, MD   10 mcg/min at 10/06/11 1500    Assessment/Plan Symptomatic cholelithiasis, possible acute on chronic cholecystitis/ CT guided 65F GB drain 10/07/11 in IR. STEMI/Takotsubo CM Acute on Chronic Renal Insufficiency Left Renal & Hepatic Cysts by CT Hypertension Hyperlipidemia BMI 29  Plan:  Home on 2 weeks of Cipro 500 mg po bid, Irrigate drain daily, Home  health assistance if needed.  I ask case manager to see.  Follow up with DR. Cornett.   LOS: 3 days    JENNINGS,WILLARD 10/09/2011 Doing well. No pain or fevers.  Plan for discharge when stable heart.  No maintenance needed for the drain.  Just record drain output daily and empty PRN.  Change outer skin dressing PRN with clean gauze.  No home health will probably be needed.  F/u with [facility] in 3 weeks.

## 2011-10-10 ENCOUNTER — Telehealth (INDEPENDENT_AMBULATORY_CARE_PROVIDER_SITE_OTHER): Payer: Self-pay | Admitting: Surgery

## 2011-10-10 ENCOUNTER — Encounter (HOSPITAL_COMMUNITY): Payer: Self-pay

## 2011-10-10 DIAGNOSIS — K819 Cholecystitis, unspecified: Secondary | ICD-10-CM

## 2011-10-10 DIAGNOSIS — N289 Disorder of kidney and ureter, unspecified: Secondary | ICD-10-CM

## 2011-10-10 DIAGNOSIS — I214 Non-ST elevation (NSTEMI) myocardial infarction: Secondary | ICD-10-CM

## 2011-10-10 DIAGNOSIS — I213 ST elevation (STEMI) myocardial infarction of unspecified site: Secondary | ICD-10-CM

## 2011-10-10 DIAGNOSIS — E782 Mixed hyperlipidemia: Secondary | ICD-10-CM

## 2011-10-10 LAB — BASIC METABOLIC PANEL
Chloride: 103 mEq/L (ref 96–112)
Creatinine, Ser: 1.18 mg/dL (ref 0.50–1.35)
GFR calc Af Amer: 68 mL/min — ABNORMAL LOW (ref >90)
GFR calc non Af Amer: 59 mL/min — ABNORMAL LOW (ref >90)
Potassium: 3.8 mEq/L (ref 3.5–5.1)

## 2011-10-10 LAB — BODY FLUID CULTURE
Culture: NO GROWTH
Gram Stain: NONE SEEN

## 2011-10-10 MED ORDER — ATORVASTATIN CALCIUM 40 MG PO TABS: 20.0000 mg | ORAL_TABLET | Freq: Every evening | ORAL | Status: DC

## 2011-10-10 MED ORDER — CARVEDILOL 6.25 MG PO TABS: 6.2500 mg | ORAL_TABLET | Freq: Two times a day (BID) | ORAL | Status: DC

## 2011-10-10 MED ORDER — CIPROFLOXACIN HCL 500 MG PO TABS: 500.0000 mg | ORAL_TABLET | Freq: Two times a day (BID) | ORAL | Status: AC

## 2011-10-10 MED ORDER — LISINOPRIL 40 MG PO TABS: 40.0000 mg | ORAL_TABLET | Freq: Every day | ORAL | Status: DC

## 2011-10-10 NOTE — Progress Notes (Signed)
Patient ID: Darius Baxter, male   DOB: [birth year], 78 y.o.   MRN: [id number] Patient ID: Darius Baxter, male   DOB: December 18, 1933, 78 y.o.   MRN: [id number] Subjective:  No chest pain. No nausea or vomiting.  Objective:  Vital Signs in the last 24 hours: Temp:  [97.9 F (36.6 C)-98.1 F (36.7 C)] 98.1 F (36.7 C) (02/20 0912) Pulse Rate:  [55-64] 64  (02/20 0912) Resp:  [18-20] 18  (02/20 0912) BP: (149-167)/(67-85) 151/82 mmHg (02/20 0912) SpO2:  [95 %-97 %] 97 % (02/20 0500) Weight:  [91.536 kg (201 lb 12.8 oz)] 91.536 kg (201 lb 12.8 oz) (02/20 0500)  Intake/Output from previous day: 02/19 0701 - 02/20 0700 In: 1080 [P.O.:1080] Out: 20 [Drains:20] Intake/Output from this shift:    Physical Exam: Well appearing, NAD HEENT: Unremarkable Neck:  No JVD, no thyromegally Lymphatics:  No adenopathy Back:  No CVA tenderness Lungs:  Clear with no wheezes. HEART:  Regular rate rhythm, no murmurs, no rubs, no clicks Abd:  Flat, soft, positive bowel sounds, no organomegally, no rebound, no guarding Ext:  2 plus pulses, no edema, no cyanosis, no clubbing Skin:  No rashes no nodules Neuro:  CN II through XII intact, motor grossly intact  Lab Results:  Basename 10/08/11 0459  WBC 7.8  HGB 12.9*  PLT 127*    Basename 10/10/11 0634 10/08/11 0459  NA 140 136  K 3.8 4.2  CL 103 103  CO2 24 27  GLUCOSE 97 98  BUN 22 27*  CREATININE 1.18 1.32   No results found for this basename: TROPONINI:2,CK,MB:2 in the last 72 hours Hepatic Function Panel No results found for this basename: PROT,ALBUMIN,AST,ALT,ALKPHOS,BILITOT,BILIDIR,IBILI in the last 72 hours No results found for this basename: CHOL in the last 72 hours No results found for this basename: PROTIME in the last 72 hours  Imaging: No results found.  Cardiac Studies: Tele - nsr with pvc's. Assessment/Plan:  1. Acute cholecystitis - s/p drain doing well. Would anticipate proceeding with surgery in the next 4-6 weeks.  Needs Cipro for 2 weeks. He is going to follow up with Surgery in 2 weeks.  2. Takasubo cardiomyopathy - stable overnight. He can be discharged today on current medical therapy but increase Lisinopril to 40 mg daily. Can change Crestor back to Atorvastatin.  Follow up with Dr. Earnestine Leys in [place] in 3 weeks.   LOS: 4 days    Lorine Bears 10/10/2011, 10:48 AM

## 2011-10-10 NOTE — Progress Notes (Deleted)
Patient's heart rate down to 40's nonsustained, then right back up to 70-80's atrial fibrillation.  Patient is sleeping.  Dr. Elease Hashimoto paged and notified.  New orders received. Will continue to monitor.  Darius Baxter

## 2011-10-10 NOTE — Progress Notes (Signed)
  Subjective: Pt ok. No c/o Tol reg diet  Objective: Vital signs in last 24 hours: Temp:  [97.9 F (36.6 C)-98 F (36.7 C)] 97.9 F (36.6 C) (02/20 0500) Pulse Rate:  [55-59] 58  (02/20 0500) Resp:  [18-20] 19  (02/20 0500) BP: (136-167)/(67-85) 167/85 mmHg (02/20 0500) SpO2:  [95 %-97 %] 97 % (02/20 0500) Weight:  [201 lb 12.8 oz (91.536 kg)] 201 lb 12.8 oz (91.536 kg) (02/20 0500) Last BM Date: 10/09/11  Intake/Output this shift:    Physical Exam: BP 167/85  Pulse 58  Temp(Src) 97.9 F (36.6 C) (Oral)  Resp 19  Ht 6\' 1"  (1.854 m)  Wt 201 lb 12.8 oz (91.536 kg)  BMI 26.62 kg/m2  SpO2 97% Abdomen: soft, NT Drain intact.  Labs: CBC  Basename 10/08/11 0459  WBC 7.8  HGB 12.9*  HCT 38.2*  PLT 127*   BMET  Basename 10/08/11 0459  NA 136  K 4.2  CL 103  CO2 27  GLUCOSE 98  BUN 27*  CREATININE 1.32  CALCIUM 9.3   LFT No results found for this basename: PROT,ALBUMIN,AST,ALT,ALKPHOS,BILITOT,BILIDIR,IBILI,LIPASE in the last 72 hours PT/INR No results found for this basename: LABPROT:2,INR:2 in the last 72 hours ABG No results found for this basename: PHART:2,PCO2:2,PO2:2,HCO3:2 in the last 72 hours  Studies/Results: No results found.  Assessment: Principal Problem:  *Takotsubo cardiomyopathy Cholecystitis  Procedure(s): LEFT HEART CATHETERIZATION WITH CORONARY ANGIOGRAM PERC CHOLECYSTOSTOMY drain Plan: As yesterday, 2 weeks of po Cipro. No drain maintenance needed, just empty bag as needed and record output daily. Follow up with Dr. Luisa Hart, info in Follow up section  LOS: 4 days    Alyse Low 10/10/2011 7:47 AM

## 2011-10-10 NOTE — Discharge Summary (Signed)
Discharge Summary   Patient ID: Darius Baxter MRN: [id number], DOB/AGE: [birth year] 76 y.o.  Primary MD: Dr. Joette Catching Primary Cardiologist: Peyton Bottoms, MD  Admit date: 10/06/2011 D/C date:     10/10/2011      Primary Discharge Diagnoses:  1. STEMI  - EKG w/ minimal STE V1-2, aVL & STD inferior leads  - s/p cardiac cath 10/05/11 revealing nonobstructive CAD w/ mod-severe LV dysfunction (EF 35%) consistent w/ Takotsubo CM  2. Takotsubo CM  - Felt due to stress in the setting of cholecystitis  - s/p cardiac cath revealing mod-severe LV dysfunction consistent w/ Takotsubo CM  - Echo 10/06/11 showed EF 30-35%, w/ distribution of segmental wall motion abnormalities c/w either ischemic cardiomyopathy or Tako- tsubo cardiomyopathy 3. Cholecystitis  - CT revealed distended gallbladder w/ stones  - s/p percutaneous cholecystostomy tube placement on 10/07/11  - F/u with Dr. Luisa Hart as scheduled below with plans for possible cholecystectomy in the future  - On Cipro day 5 of 14 4. Acute on Chronic Renal Insufficiency  - Resolved. Likely 2/2 cardiac cath 5. Left Renal & Hepatic Cysts by CT  - Needs outpatient follow up  Secondary Discharge Diagnoses:  1. HLD - LDL 41 on statin 2. HTN  Allergies Allergen Reactions  . Aleve ([facility] Naproxen Sodium)     Patient has only 1 kidney and Doctor  Doesn't want him to take    Diagnostic Studies/Procedures:  2D echo - 10/06/11  Study Conclusions:  - Left ventricle: The cavity size was normal. Wall thickness was increased in a pattern of moderate LVH. Systolic function was moderately to severely reduced. The estimated ejection fraction was in the range of 30% to 35%. Doppler parameters are consistent with abnormal left ventricular relaxation (grade 1 diastolic dysfunction). - Regional wall motion abnormality: Akinesis of the mid-apical anterior, mid anteroseptal, mid inferoseptal, apical inferior, mid inferolateral, mid anterolateral, apical  septal, apical lateral, and apical myocardium. Distribution of segmental wall motion abnormalities c/w either ischemic cardiomyopathy or Tako- tsubo cardiomyopathy. - Right ventricle: Akinesis distal RV free wall. The cavity size was mildly dilated. Systolic function was normal.  - Atrial septum: No defect or patent foramen ovale was identified. - Pulmonary arteries: PA peak pressure: 39mm Hg (S). - Inferior vena cava: The vessel was dilated; the respirophasic diameter changes were blunted (< 50%); findings are consistent with elevated central venous pressure.   Cardiac Cath 10/05/2011:  Hemodynamics:  AO 108/75 with a mean of 90 mmHg  LV 104/34 mmHg  Coronary angiography:  Coronary dominance: right  Left mainstem: Normal  Left anterior descending (LAD): 20-30% irregularities in the proximal vessel. This is a large vessel which extends around the apex. No obstructive disease is noted.  There is a large ramus intermediate branch which has 20% narrowing at the ostium.  Left circumflex (LCx): 20-30% narrowing at the ostium.  Right coronary artery (RCA): Normal.  Left ventriculography: Left ventricular size is increased. There is severe hypokinesis to akinesis of the mid to distal anterior wall, apex, and mid to distal inferior wall with overall moderately severe LV dysfunction. Ejection fraction is estimated at 35%.  Final Conclusions:  1. Nonobstructive coronary disease.  2. Moderate to severe left ventricular dysfunction with characteristic appearance of Takotsubo cardiomyopathy.  Recommendations: Medical management.   10/06/2011 - CTA Chest  Findings: The noncontrast scan shows no hyperdense hematoma, mediastinal fluid, or pericardial effusion. Patchy coronary and aortic calcifications. No aortic dissection, aneurysm, or stenosis. Classic three-vessel brachiocephalic arterial  origin anatomy without proximal stenosis. There is incomplete opacification of the pulmonary arterial tree which is  grossly unremarkable; exam was not optimized for detection of pulmonary emboli. No pleural effusion. No hilar or mediastinal adenopathy. There is some dependent atelectasis or scarring posteriorly in the lung bases. Mild spondylitic changes in the lower thoracic spine. Review of the MIP images confirms the above findings. IMPRESSION: 1. Negative for acute PE or thoracic aortic dissection. 2. Patchy coronary and aortic calcifications.  CTA ABDOMEN AND PELVIS  Findings: Abdominal aorta: Patchy calcified plaque without dissection, aneurysm, or stenosis. Celiac axis: Widely patent Superior mesenteric artery: Widely patent, with classic distal branching Left renal artery: There are two, the inferior dominant. Both are widely patent. Right renal artery: Ligated proximally, post nephrectomy. Inferior mesenteric artery: Origin stenosis related to aortic wall plaque, patent distally Bifurcation: Partially calcified plaque without aneurysm or stenosis. Left iliac: Mild tortuosity, with mild plaque in the proximal common iliac. No dissection, aneurysm, or stenosis. Common femoral artery and visualized portions of the proximal SFA and profunda femoris widely patent. Right iliac: Mild tortuosity without dissection, aneurysm, or stenosis. Common femoral artery and visualized portions of the proximal SFA and profunda femoris widely patent. Venous phase: Not obtained Nonvascular findings: The gallbladder is distended with several noncalcified stones suspected in its dependent portion. Probable small cyst in the lateral left hepatic segment, incompletely characterized. Unremarkable spleen, adrenal glands. Small left renal cyst. Mild pancreatic atrophy without focal lesion. Small bowel decompressed. Multiple descending and sigmoid diverticula without adjacent inflammatory/edematous change. No ascites. Urinary bladder physiologically distended. Moderate prostatic enlargement with central coarse calcifications. Bilateral pelvic  phleboliths. No pelvic, retroperitoneal, or mesenteric adenopathy. The portal vein is patent. Spondylitic changes in the lower lumbar spine. Review of the MIP images confirms the above findings. IMPRESSION: 1. No abdominal dissection, aneurysm, or significant branch vessel disease. 2. Distended gallbladder containing stones. 3. Probable left renal and hepatic cysts, incompletely characterized. 4. Descending and sigmoid diverticulosis.   10/07/2011 - Abd US  Findings: Gallbladder: A few gallstones are present. The largest measures approximately 16 mm. Gallbladder wall thickness is normal, measures 2.1 to 2.6 mm. The sonographic Murphy's sign is negative. Common Bile Duct: Measures 5 mm where visualized, within normal limits for caliber. Liver: Right lobe of the liver is difficult to evaluate due to bowel gas from adjacent colon. The imaged portion of the liver shows a 1.3 centimeter simple cyst in the left lobe. No suspicious hepatic lesion. IVC: Appears normal. Pancreas: Although the pancreas is difficult to visualize in its entirety, no focal pancreatic abnormality is identified. Spleen: Normal size and echotexture without focal parenchymal abnormality. Right kidney: Status post right nephrectomy. Left kidney: Left kidney measures 12.9 cm in sagittal length. There is no hydronephrosis. No evidence of renal mass. Abdominal Aorta: No aneurysm identified. IMPRESSION: 1. Cholelithiasis. The gallbladder is distended. 2. Negative for/no evidence of acute cholecystitis. 3. Right nephrectomy. Left kidney within normal limits.   10/07/2011 PERCUTANEOUS CHOLECYSTOSTOMY TUBE PLACEMENT WITH CT GUIDANCE  Under CT fluoroscopic guidance, gallbladder was accessed using a transhepatic approach with an 18-gauge needle, taking care to avoid overlying bowel loops. A guide wire advanced easily, its position confirmed on CT. Advancement of the a 10-French multi side-hole catheter directly over the guide wire resulted in extraluminal  placement of the catheter, presumably secondary to tenting of gallbladder wall. For this reason, a 15 cm 18 gauge trocar needle was advanced into the gallbladder lumen, positioning confirmed on CT. Guide wire advanced easily within  the lumen of the gallbladder. The tract was dilated with a 10-French dilator, which facilitated advancement of a 10-French multi side-hole pigtail catheter into the gallbladder lumen. Position was confirmed on CT fluoroscopy, and 20 ml of dark brown viscous bile were aspirated, confirming intraluminal placement. The aspirate was sent for Gram stain, culture and sensitivity. Catheter secured externally with 0 Prolene suture and placed to external drain bag. Patient tolerated the procedure well, with no immediate complication. IMPRESSION: 1. Technically successful percutaneous cholecystostomy tube placement with CT guidance.    History of Present Illness: 78 y.o. male w/ PMHx significant for HTN & HLD who presented to [facility] ED on 10/06/11 for chest/back pain and was subsequently transferred to [facility] for a STEMI.   He was in his usual state of health on the night of presentation when he developed pain between his scapula that was sharp, 8/10 in severity, non-radiating and not associated with shortness of breath, diaphoresis, nausea or vomiting. He presented to [facility] ER for evaluation of his infrascapular pain and while there developed sharp pain in his chest.   Hospital Course: EKG showed 1 mm ST elevation in leads V1 and V2 and 0.5 mm ST elevation in lead aVL. Based on his presentation, he was transferred to [facility] as a code STEMI. He was sent to the cath lab where he was found to have nonobstructive CAD w/ mod-severe LV dysfunction (EF 35%) consistent w/ Takotsubo CM. He tolerated the procedure well without complications.   An echocardiogram was completed revealing EF 30-35%, w/ distribution of segmental wall motion abnormalities c/w either ischemic  cardiomyopathy or Tako- tsubo cardiomyopathy. He was initiated on a low dose BB, diuresed for elevated LVEDP, and resumed on an ACEI. Initially he had some mild hypotension and elevation in Crt requiring down titration of BB and ACEI. Cardiac enzymes were positive which was thought to be from stress cardiomyopathy, however, a CTA was completed to rule out aortic dissection.   CT was negative for PE or aortic dissection, but incidentally revealed a distended gallbladder w/ stones. He was afebrile with normal LFTs & WBC. He was evaluated by surgery who felt his findings were consistent with acute cholecystitis or biliary colic. They placed him on Cipro and ordered further evaluation with an abdominal US. Surgery felt he was not a candidate for cholecystectomy due to cardiac event and possible acute cholecystitis and therefore placed cholecystostomy tube. He tolerated the procedure without complications. He will go home with the cholecystostomy drain in place and follow up with Dr. Luisa Hart in ~ 3wks. They would like to plan for cholecystectomy when medically stable from a cardiac standpoint, which Dr. Kirke Corin feels will be in the next 4-6wks. He will complete a 2wk course of Cipro (on day 5).  Troponins trended down and he remained chest pain free. Of note, CT also showed left renal and hepatic cysts that will need to be followed by PCP as an outpatient. He was seen and evaluated by Dr. Kirke Corin who felt he was stable for discharge home with plans for follow up as scheduled below.  Discharge Vitals: Blood pressure 151/82, pulse 64, temperature 98.1 F (36.7 C), temperature source Oral, resp. rate 18, height 6\' 1"  (1.854 m), weight 201 lb 12.8 oz (91.536 kg), SpO2 97.00%.  Labs: Component Value Date   WBC 7.8 10/08/2011   HGB 12.9* 10/08/2011   HCT 38.2* 10/08/2011   MCV 89.9 10/08/2011   PLT 127* 10/08/2011    Lab 10/10/11 0634 10/07/11  0542  NA 140 --  K 3.8 --  CL 103 --  CO2 24 --  BUN 22 --  CREATININE  1.18 --  CALCIUM 10.5 --  PROT -- 6.7  BILITOT -- 0.8  ALKPHOS -- 57  ALT -- 10  AST -- 16  GLUCOSE 97 --   Basename  10/06/11 1633   LIPASE  23    Component Value Date   CHOL 143 10/06/2011   HDL 40 10/06/2011   LDLCALC 41 10/06/2011   TRIG 310* 10/06/2011    10/06/2011 02:18  10/06/2011 09:44  10/06/2011 15:24   CK, MB  10.9 (HH)  10.0 (HH)  6.6 (HH)   CK Total  115  102  77   Troponin I  3.20 (HH)  2.68 (HH)  1.28 (HH)     10/06/2011 02:18   TSH  1.751     Discharge Medications   Medication List  As of 10/10/2011  1:28 PM   STOP taking these medications         felodipine 2.5 MG 24 hr tablet      olmesartan-hydrochlorothiazide 20-12.5 MG per tablet      quinapril 40 MG tablet         TAKE these medications         aspirin 81 MG EC tablet   Take 81 mg by mouth daily.      atorvastatin 40 MG tablet   Commonly known as: LIPITOR   Take 0.5 tablets (20 mg total) by mouth every evening.      carvedilol 6.25 MG tablet   Commonly known as: COREG   Take 1 tablet (6.25 mg total) by mouth 2 (two) times daily with a meal.      ciprofloxacin 500 MG tablet   Commonly known as: CIPRO   Take 1 tablet (500 mg total) by mouth 2 (two) times daily.      furosemide 20 MG tablet   Commonly known as: LASIX   Take 20 mg by mouth daily.      hydrocodone-acetaminophen 5-500 MG per capsule   Commonly known as: LORCET-HD   Take 1 capsule by mouth 2 (two) times a week. Patient usually takes on Wednesday and Sunday mornings.      lisinopril 40 MG tablet   Commonly known as: PRINIVIL,ZESTRIL   Take 1 tablet (40 mg total) by mouth daily.      mulitivitamin with minerals Tabs   Take 1 tablet by mouth daily. Take 1 tablet on Sunday,Wednesday, Thursday, & Friday            Disposition   Discharge Orders    Future Orders Please Complete By Expires   Diet - low sodium heart healthy      Increase activity slowly      Discharge instructions      Comments:   **PLEASE REMEMBER  TO BRING ALL OF YOUR MEDICATIONS TO EACH OF YOUR FOLLOW-UP OFFICE VISITS.  * KEEP WRIST CATHETERIZATION SITE CLEAN AND DRY. Call the office for any signs of bleeding, pus, swelling, increased pain, or any other concerns. * NO HEAVY LIFTING (>10lbs) OR SEXUAL ACTIVITY X 2 DAYS. * NO SOAKING BATHS, HOT TUBS, POOLS, ETC., X 2 DAYS.  * Instructions for your gallbladder drain: Record drain output daily and empty as needed and flush it with water daily.  Change outer skin dressing as needed with clean gauze. Call Dr. Rosezena Sensor office with any questions or concerns. Take your antibiotic (Cipro) until you  finish the prescription (another 9 days).  * You will need to follow up with your primary care doctor regarding cysts on your left kidney and liver seen on CT scan.      Follow-up Information    Follow up with CORNETT,THOMAS A., MD. Schedule an appointment as soon as possible for a visit in 3 weeks. (Call for fever, pain at site of drain, abdominal pain.  Change in drainage.)    Contact information:   [facility] [place] [address] [place] [address] [phone]       Follow up with Peyton Bottoms, MD. (Our office will call you with your appointment time)    Contact information:   [facility] [address] [place] [place] [address] [phone]       Follow up with Josue Hector, MD. Schedule an appointment as soon as possible for a visit in 3 weeks.   Contact information:   [address] [place] [address] [phone]           Outstanding Labs/Studies:  1. Needs outpatient follow up of Left Renal & Hepatic Cysts by CT    Duration of Discharge Encounter: Greater than 30 minutes including physician and PA time.  Signed, Lance Huaracha PA-C 10/10/2011, 1:28 PM

## 2011-10-10 NOTE — Progress Notes (Signed)
Reviewed discharge instructions with patient and family, they stated their understanding.  Patient discharging home with cholecystotomy drain, he has been educated on how to change his dressing, empty drain, and record output.  Leg bag applied to drain for comfort.  Patient given instruction on new medication, MI, and cholecystitis.  Patient discharged out via wheelchair.  Colman Cater

## 2011-10-10 NOTE — Discharge Instructions (Addendum)
Gallbladder Disease Gallbladder disease (cholecystitis) is an inflammation of your gallbladder. It is usually caused by a build-up of stones (gallstones) or sludge (cholelithiasis) in your gallbladder. The gallbladder is not an essential organ. It is located slightly to the right of center in the belly (abdomen), behind the liver. It stores bile made in the liver. Bile aids in digestion of fats. Gallbladder disease may result in nausea (feeling sick to your stomach), abdominal pain, and jaundice. In severe cases, emergency surgery may be required. The most common type of gallbladder disease is gallstones. They begin as small crystals and slowly grow into stones. Gallstone pain occurs when the bile duct has spasms. The spasms are caused by the stone passing out of the duct. The stone is trying to pass at the same time bile is passing into the small bowel for digestion. The pain usually begins suddenly. It may persist from several minutes to several hours. Infection can occur. Infection can add to discomfort and severity of an acute attack. The pain may be made worse by breathing deeply or by being jarred. There may be fever and tenderness to the touch. In some cases, when gallstones do not move into the bile duct, people have no pain or symptoms. These are called "silent" gallstones. Women are three times more likely to develop gallstones than men. Women who have had several pregnancies are more likely to have gallbladder disease. Physicians sometimes advise removing diseased gallbladders before future pregnancies. Other factors that increase the risk of gallbladder disease are obesity, diets heavy in fried foods and dairy products, increasing age, prolonged use of medications containing male hormones, and heredity. HOME CARE INSTRUCTIONS   If your physician prescribed an antibiotic, take as directed.   Only take over-the-counter or prescription medicines for pain, discomfort, or fever as directed by your  caregiver.   Follow a low fat diet until seen again. (Fat causes the gallbladder to contract.)   Follow-up as instructed. Attacks are almost always recurrent and surgery is usually required for permanent treatment.  SEEK IMMEDIATE MEDICAL CARE IF:   Pain is increasing and not controlled by medications.   The pain moves to another part of your abdomen or to your back. (Right sided pain can be appendicitis and left sided pain in adults can be diverticulitis).   You have a fever.   You develop nausea and vomiting.  Document Released: 08/06/2005 Document Revised: 04/18/2011 Document Reviewed: 03/25/2008 [employer] Patient Information 2012 [employer]. 

## 2011-10-11 NOTE — Telephone Encounter (Signed)
I LEFT A MESSAGE ON PT'S ANSWERING MACHINE THAT F/U APPT WITH DR. Luisa Hart ON 11-08-11 WAS OK PER DR. T. CORNETT/GY

## 2011-11-05 ENCOUNTER — Encounter: Payer: Medicare Other | Admitting: Physician Assistant

## 2011-11-08 ENCOUNTER — Encounter (INDEPENDENT_AMBULATORY_CARE_PROVIDER_SITE_OTHER): Payer: Self-pay | Admitting: Surgery

## 2011-11-08 ENCOUNTER — Ambulatory Visit (INDEPENDENT_AMBULATORY_CARE_PROVIDER_SITE_OTHER): Payer: Medicare Other | Admitting: Surgery

## 2011-11-08 VITALS — BP 152/82 | HR 72 | Ht 73.0 in | Wt 218.8 lb

## 2011-11-08 DIAGNOSIS — K8000 Calculus of gallbladder with acute cholecystitis without obstruction: Secondary | ICD-10-CM

## 2011-11-08 NOTE — Progress Notes (Signed)
Patient ID: Darius Baxter, male   DOB: 1934/04/17, 78 y.o.   MRN: [id number]  No chief complaint on file.   HPI Darius Baxter is a 78 y.o. male.   HPIPatient returns in followup after being seen in the hospital after myocardial infarction and acute cholecystitis. He had a percutaneous cholecystostomy tube placed. He is doing well from both. His drain is not draining much. He has no bowel pain. He denies any chest pain or shortness of breath.  Past Medical History  Diagnosis Date  . HTN (hypertension)   . HLD (hyperlipidemia)   . Ventricular ectopy     bigeminy  . Valvular heart disease     mild MVP/MR by 2-D echo, 8/10  . Seizures     had 1 seizure at age 67,unknown orgin.no more since and on no meds  . Chronic kidney disease     had right kidney removed for cancer-5 yrs ago  . Cancer     right kidney  . Arthritis   . Chronic back pain     due to arthritis    Past Surgical History  Procedure Date  . Nephrectomy     right nephrectomy for cancer  . Appendectomy   . Knee arthroscopy     right knee  . Right total knee 3 yrs ago    right 3 yrs ago at [facility]  . Cataract extraction w/phaco 03/22/2011    Procedure: CATARACT EXTRACTION PHACO AND INTRAOCULAR LENS PLACEMENT (IOC);  Surgeon: Gemma Payor;  Location: [facility];  Service: Ophthalmology;  Laterality: Right;  CDE  19.88  . Cataract extraction w/phaco 04/16/2011    Procedure: CATARACT EXTRACTION PHACO AND INTRAOCULAR LENS PLACEMENT (IOC);  Surgeon: Gemma Payor;  Location: [facility];  Service: Ophthalmology;  Laterality: Left;  CDE 21.13    Family History  Problem Relation Age of Onset  . Cancer Other   . Stroke Other   . Coronary artery disease Other   . Hypertension Other   . Anesthesia problems Neg Hx   . Hypotension Neg Hx   . Malignant hyperthermia Neg Hx   . Pseudochol deficiency Neg Hx     Social History History  Substance Use Topics  . Smoking status: Never Smoker   . Smokeless tobacco: Not on file  . Alcohol  Use: No    Allergies  Allergen Reactions  . Aleve ([facility]vs Naproxen Sodium)     Patient has only 1 kidney and Doctor  Doesn't want him to take    Current Outpatient Prescriptions  Medication Sig Dispense Refill  . aspirin 81 MG EC tablet Take 81 mg by mouth daily.        Marland Kitchen atorvastatin (LIPITOR) 40 MG tablet Take 0.5 tablets (20 mg total) by mouth every evening.      . carvedilol (COREG) 6.25 MG tablet Take 1 tablet (6.25 mg total) by mouth 2 (two) times daily with a meal.  60 tablet  6  . furosemide (LASIX) 20 MG tablet Take 20 mg by mouth daily.       . hydrocodone-acetaminophen (LORCET-HD) 5-500 MG per capsule Take 1 capsule by mouth 2 (two) times a week. Patient usually takes on Wednesday and Sunday mornings.       Marland Kitchen lisinopril (PRINIVIL,ZESTRIL) 40 MG tablet Take 1 tablet (40 mg total) by mouth daily.  30 tablet  6  . Multiple Vitamin (MULITIVITAMIN WITH MINERALS) TABS Take 1 tablet by mouth daily. Take 1 tablet on Sunday,Wednesday, Thursday, & Friday  Review of Systems Review of Systems  Constitutional: Negative for fever, chills and unexpected weight change.  HENT: Negative for hearing loss, congestion, sore throat, trouble swallowing and voice change.   Eyes: Negative for visual disturbance.  Respiratory: Negative for cough and wheezing.   Cardiovascular: Negative for chest pain, palpitations and leg swelling.  Gastrointestinal: Negative for nausea, vomiting, abdominal pain, diarrhea, constipation, blood in stool, abdominal distention, anal bleeding and rectal pain.  Genitourinary: Negative for hematuria and difficulty urinating.  Musculoskeletal: Negative for arthralgias.  Skin: Negative for rash and wound.  Neurological: Negative for seizures, syncope, weakness and headaches.  Hematological: Negative for adenopathy. Does not bruise/bleed easily.  Psychiatric/Behavioral: Negative for confusion.    Blood pressure 152/82, pulse 72, height 6\' 1"  (1.854 m), weight 218 lb  12.8 oz (99.247 kg).  Physical Exam Physical Exam  Constitutional: He is oriented to person, place, and time. He appears well-developed and well-nourished.  HENT:  Head: Normocephalic and atraumatic.  Eyes: EOM are normal.  Cardiovascular: Normal rate and regular rhythm.   Pulmonary/Chest: Effort normal and breath sounds normal.  Abdominal: Soft. Bowel sounds are normal. There is no tenderness.  Musculoskeletal: Normal range of motion.  Neurological: He is alert and oriented to person, place, and time.  Skin: Skin is warm and dry.  Psychiatric: He has a normal mood and affect. His behavior is normal. Judgment and thought content normal.    Data Reviewed CT abdomen and U/S  Gallstones  Assessment    Past Medical History  Diagnosis Date  . HTN (hypertension)   . HLD (hyperlipidemia)   . Ventricular ectopy     bigeminy  . Valvular heart disease     mild MVP/MR by 2-D echo, 8/10  . Seizures     had 1 seizure at age 32,unknown orgin.no more since and on no meds  . Chronic kidney disease     had right kidney removed for cancer-5 yrs ago  . Cancer     right kidney  . Arthritis   . Chronic back pain     due to arthritis  cholecysttis    Plan    Needs Lap cholecystectomy at some point.  Will ask cardiology when this can be done,  Return 1 month.  Continue tube care       Darius Baxter A. 11/08/2011, 12:05 PM

## 2011-11-08 NOTE — Patient Instructions (Signed)

## 2011-11-09 ENCOUNTER — Encounter: Payer: Self-pay | Admitting: Physician Assistant

## 2011-11-09 ENCOUNTER — Ambulatory Visit (INDEPENDENT_AMBULATORY_CARE_PROVIDER_SITE_OTHER): Payer: Medicare Other | Admitting: Physician Assistant

## 2011-11-09 VITALS — BP 140/68 | HR 54 | Ht 73.0 in | Wt 217.1 lb

## 2011-11-09 DIAGNOSIS — K819 Cholecystitis, unspecified: Secondary | ICD-10-CM

## 2011-11-09 DIAGNOSIS — I10 Essential (primary) hypertension: Secondary | ICD-10-CM

## 2011-11-09 DIAGNOSIS — E785 Hyperlipidemia, unspecified: Secondary | ICD-10-CM

## 2011-11-09 DIAGNOSIS — I5181 Takotsubo syndrome: Secondary | ICD-10-CM

## 2011-11-09 DIAGNOSIS — Z79899 Other long term (current) drug therapy: Secondary | ICD-10-CM

## 2011-11-09 DIAGNOSIS — I5022 Chronic systolic (congestive) heart failure: Secondary | ICD-10-CM

## 2011-11-09 NOTE — Assessment & Plan Note (Signed)
Continue current dose atorvastatin. Recent LDL 41

## 2011-11-09 NOTE — Progress Notes (Signed)
HPI: Patient presents for post hospital followup, from [facility], following recent direct transferr from [facility] ED, with a diagnosis of code ST EMI.  Clinically, he presented with complaint of mid scapular pain, and was found to have 0.5-1 mm ST segment elevation in leads V1, V2, and aVL. He was taken directly to the Cath Lab, and found to have nonobstructive CAD with moderate LVD (EF 35%). He was diagnosed with Takotsubo cardiomyopathy. 2-D echo yielded EF 30-35%. A CTA of chest was negative for aortic dissection, but with incidental finding of a distended gallbladder with stones. He was referred for surgical evaluation, and was diagnosed with acute cholecystitis vs biliary colic. He underwent placement of a cholecystectomy tube, given that he was not a current candidate for surgery. Dr. Kirke Corin suggested that the patient could proceed with cholecystectomy in the following 4-6 weeks, following stabilization of his clinical status.  Patient was seen in followup by Dr. Luisa Hart yesterday, who recommended postponing surgery for a full 3 months, following his initial cardiac event. He continues to have the drainage tube in place.  He was also found to have left renal/hepatic cyst by CT scan, and this is now being followed by his primary care physician, Dr. Lysbeth Galas.  From a clinical standpoint, patient denies any interim development of CP, DOE, PND, orthopnea, LE edema, or presyncope/syncope. He remains compliant with his medications. He has not had any followup blood work.  Patient reports no complications of the R. wrist incision site.  Allergies  Allergen Reactions  . Aleve (Cvs Naproxen Sodium)     Patient has only 1 kidney and Doctor  Doesn't want him to take    Current Outpatient Prescriptions  Medication Sig Dispense Refill  . aspirin 81 MG EC tablet Take 81 mg by mouth daily.        Marland Kitchen atorvastatin (LIPITOR) 40 MG tablet Take 0.5 tablets (20 mg total) by mouth every evening.      . carvedilol  (COREG) 6.25 MG tablet Take 1 tablet (6.25 mg total) by mouth 2 (two) times daily with a meal.  60 tablet  6  . furosemide (LASIX) 20 MG tablet Take 20 mg by mouth daily.       . hydrocodone-acetaminophen (LORCET-HD) 5-500 MG per capsule Take 1 capsule by mouth 2 (two) times a week. Patient usually takes on Wednesday and Sunday mornings.       Marland Kitchen lisinopril (PRINIVIL,ZESTRIL) 40 MG tablet Take 1 tablet (40 mg total) by mouth daily.  30 tablet  6  . Multiple Vitamin (MULITIVITAMIN WITH MINERALS) TABS Take 1 tablet by mouth daily. Take 1 tablet on Sunday,Wednesday, Thursday, & Friday        Past Medical History  Diagnosis Date  . HTN (hypertension)   . HLD (hyperlipidemia)   . Ventricular ectopy     bigeminy  . Valvular heart disease     mild MVP/MR by 2-D echo, 8/10  . Seizures     had 1 seizure at age 14,unknown orgin.no more since and on no meds  . Chronic kidney disease     had right kidney removed for cancer-5 yrs ago  . Cancer     right kidney  . Arthritis   . Chronic back pain     due to arthritis    Past Surgical History  Procedure Date  . Nephrectomy     right nephrectomy for cancer  . Appendectomy   . Knee arthroscopy     right knee  . Right total  knee 3 yrs ago    right 3 yrs ago at [facility]  . Cataract extraction w/phaco 03/22/2011    Procedure: CATARACT EXTRACTION PHACO AND INTRAOCULAR LENS PLACEMENT (IOC);  Surgeon: Gemma Payor;  Location: [facility];  Service: Ophthalmology;  Laterality: Right;  CDE  19.88  . Cataract extraction w/phaco 04/16/2011    Procedure: CATARACT EXTRACTION PHACO AND INTRAOCULAR LENS PLACEMENT (IOC);  Surgeon: Gemma Payor;  Location: [facility];  Service: Ophthalmology;  Laterality: Left;  CDE 21.13    History   Social History  . Marital Status: Married    Spouse Name: N/A    Number of Children: N/A  . Years of Education: N/A   Occupational History  . Not on file.   Social History Main Topics  . Smoking status: Never Smoker   . Smokeless  tobacco: Not on file  . Alcohol Use: No  . Drug Use: No  . Sexually Active: Yes    Birth Control/ Protection: None   Other Topics Concern  . Not on file   Social History Narrative   Does not regularly exercise.     Family History  Problem Relation Age of Onset  . Cancer Other   . Stroke Other   . Coronary artery disease Other   . Hypertension Other   . Anesthesia problems Neg Hx   . Hypotension Neg Hx   . Malignant hyperthermia Neg Hx   . Pseudochol deficiency Neg Hx     ROS: no nausea, vomiting; no fever, chills; no melena, hematochezia; no claudication  PHYSICAL EXAM: BP 140/68  Pulse 54  Ht 6\' 1"  (1.854 m)  Wt 217 lb 1.9 oz (98.485 kg)  BMI 28.65 kg/m2  SpO2 98% GENERAL: 78 year old male, standing upright; NAD HEENT: NCAT, PERRLA, EOMI; sclera clear; no xanthelasma NECK: palpable bilateral carotid pulses, no bruits; no JVD; no TM LUNGS: CTA bilaterally CARDIAC: RRR (S1, S2); no significant murmurs; no rubs or gallops ABDOMEN: soft, non-tender; intact BS EXTREMETIES: intact distal pulses; no significant peripheral edema SKIN: warm/dry; no obvious rash/lesions MUSCULOSKELETAL: no joint deformity NEURO: no focal deficit; NL affect   EKG:    ASSESSMENT & PLAN:

## 2011-11-09 NOTE — Assessment & Plan Note (Signed)
Stable on current medication regimen, which includes ACE inhibitor and carvedilol.

## 2011-11-09 NOTE — Patient Instructions (Signed)
Your physician wants you to follow-up in: 3 months. You will receive a reminder letter in the mail one-two months in advance. If you don't receive a letter, please call our office to schedule the follow-up appointment. Your physician recommends that you continue on your current medications as directed. Please refer to the Current Medication list given to you today. Your physician recommends that you go to the [facility] for lab work: BMET-DO TODAY Your physician has requested that you have an echocardiogram. Echocardiography is a painless test that uses sound waves to create images of your heart. It provides your doctor with information about the size and shape of your heart and how well your heart's chambers and valves are working. This procedure takes approximately one hour. There are no restrictions for this procedure. DO IN 3 MONTHS BEFORE OFFICE VISIT.

## 2011-11-09 NOTE — Assessment & Plan Note (Signed)
Drainage tube remains in place. Patient to followup with Dr. Luisa Hart, as scheduled. Anticipate surgery in May.

## 2011-11-09 NOTE — Assessment & Plan Note (Addendum)
Clinically stable with no current symptoms suggestive of CHF. Will order a followup 2-D echo in approximately 3 months, prior to next OV with Dr Andee Lineman. If this indicates normalization of LVF, and he remains clinically stable without complaint of CP or SOB, then he should be be ready to proceed with cholecystectomy, as planned. Will order followup labs today, for monitoring of potassium and renal function, given that he is on furosemide and ACE inhibitor. We'll continue current medication regimen, including low-dose carvedilol, given relative sinus bradycardia.

## 2011-11-09 NOTE — Progress Notes (Signed)
Also needs clearance for gallbladder surgery.  (Dr. Luisa Hart)

## 2011-12-07 ENCOUNTER — Encounter (INDEPENDENT_AMBULATORY_CARE_PROVIDER_SITE_OTHER): Payer: Self-pay | Admitting: Surgery

## 2011-12-07 ENCOUNTER — Ambulatory Visit (INDEPENDENT_AMBULATORY_CARE_PROVIDER_SITE_OTHER): Payer: Medicare Other | Admitting: Surgery

## 2011-12-07 VITALS — BP 122/80 | Ht 73.0 in | Wt 214.4 lb

## 2011-12-07 DIAGNOSIS — K811 Chronic cholecystitis: Secondary | ICD-10-CM

## 2011-12-07 NOTE — Progress Notes (Signed)
Patient ID: [name], male   DOB: [birth year], 78 y.o.   MRN: [id number]  No chief complaint on file.   HPI Darius Baxter is a 78 y.o. male.   HPI The patient returns in followup for his chronic cholecystitis and cholecystostomy tube. The tube was no longer draining. He denies abdominal pain. He does notice more swelling around the tube. Denies chest pain or shortness of breath with exertion. Denies nausea vomiting. Past Medical History  Diagnosis Date  . HTN (hypertension)   . HLD (hyperlipidemia)   . Ventricular ectopy     bigeminy  . Valvular heart disease     mild MVP/MR by 2-D echo, 8/10  . Seizures     had 1 seizure at age 70,unknown orgin.no more since and on no meds  . Chronic kidney disease     had right kidney removed for cancer-5 yrs ago  . Cancer     right kidney  . Arthritis   . Chronic back pain     due to arthritis    Past Surgical History  Procedure Date  . Nephrectomy     right nephrectomy for cancer  . Appendectomy   . Knee arthroscopy     right knee  . Right total knee 3 yrs ago    right 3 yrs ago at [facility]  . Cataract extraction w/phaco 03/22/2011    Procedure: CATARACT EXTRACTION PHACO AND INTRAOCULAR LENS PLACEMENT (IOC);  Surgeon: Gemma Payor;  Location: [facility];  Service: Ophthalmology;  Laterality: Right;  CDE  19.88  . Cataract extraction w/phaco 04/16/2011    Procedure: CATARACT EXTRACTION PHACO AND INTRAOCULAR LENS PLACEMENT (IOC);  Surgeon: Gemma Payor;  Location: [facility];  Service: Ophthalmology;  Laterality: Left;  CDE 21.13    Family History  Problem Relation Age of Onset  . Cancer Other   . Stroke Other   . Coronary artery disease Other   . Hypertension Other   . Anesthesia problems Neg Hx   . Hypotension Neg Hx   . Malignant hyperthermia Neg Hx   . Pseudochol deficiency Neg Hx     Social History History  Substance Use Topics  . Smoking status: Never Smoker   . Smokeless tobacco: Not on file  . Alcohol Use: No    Allergies    Allergen Reactions  . Aleve (Cvs Naproxen Sodium)     Patient has only 1 kidney and Doctor  Doesn't want him to take    Current Outpatient Prescriptions  Medication Sig Dispense Refill  . aspirin 81 MG EC tablet Take 81 mg by mouth daily.        Marland Kitchen atorvastatin (LIPITOR) 40 MG tablet Take 0.5 tablets (20 mg total) by mouth every evening.      . carvedilol (COREG) 6.25 MG tablet Take 1 tablet (6.25 mg total) by mouth 2 (two) times daily with a meal.  60 tablet  6  . furosemide (LASIX) 20 MG tablet Take 20 mg by mouth daily.       . hydrocodone-acetaminophen (LORCET-HD) 5-500 MG per capsule Take 1 capsule by mouth 2 (two) times a week. Patient usually takes on Wednesday and Sunday mornings.       Marland Kitchen lisinopril (PRINIVIL,ZESTRIL) 40 MG tablet Take 1 tablet (40 mg total) by mouth daily.  30 tablet  6    Review of Systems Review of Systems  Constitutional: Negative for fever, chills and unexpected weight change.  HENT: Negative for hearing loss, congestion, sore throat, trouble  swallowing and voice change.   Eyes: Negative for visual disturbance.  Respiratory: Negative for cough and wheezing.   Cardiovascular: Negative for chest pain, palpitations and leg swelling.  Gastrointestinal: Negative for nausea, vomiting, abdominal pain, diarrhea, constipation, blood in stool, abdominal distention, anal bleeding and rectal pain.  Genitourinary: Negative for hematuria and difficulty urinating.  Musculoskeletal: Negative for arthralgias.  Skin: Negative for rash and wound.  Neurological: Negative for seizures, syncope, weakness and headaches.  Hematological: Negative for adenopathy. Does not bruise/bleed easily.  Psychiatric/Behavioral: Negative for confusion.    Blood pressure 122/80, height 6\' 1"  (1.854 m), weight 214 lb 6.4 oz (97.251 kg).  Physical Exam Physical Exam  Constitutional: He appears well-developed and well-nourished.  HENT:  Head: Normocephalic and atraumatic.  Eyes: EOM are  normal. Pupils are equal, round, and reactive to light.  Neck: Normal range of motion. Neck supple.  Cardiovascular: Normal rate and regular rhythm.   Pulmonary/Chest: Effort normal and breath sounds normal.  Abdominal: Soft. Bowel sounds are normal. There is no tenderness.    Skin: Skin is warm, dry and intact.  Psychiatric: He has a normal mood and affect. His speech is normal and behavior is normal. Thought content is not paranoid.    Data Reviewed Dr Andee Lineman office note  Assessment    Chronic cholecystitis with cholecystostomy tube  History of myocardial infarction    Plan        Ready for laparoscopic cholecystectomy. We'll obtain cardiac clearance and schedule surgery the next few weeks.The procedure has been discussed with the patient. Operative and non operative treatments have been discussed. Risks of surgery include bleeding, infection,  Common bile duct injury,  Injury to the stomach,liver, colon,small intestine, abdominal wall,  Diaphragm,  Major blood vessels,  And the need for an open procedure.  Other risks include worsening of medical problems, death,  DVT and pulmonary embolism, and cardiovascular events.   Medical options have also been discussed. The patient has been informed of long term expectations of surgery and non surgical options,  The patient agrees to proceed.     Layden Caterino A. 12/07/2011, 10:56 AM

## 2011-12-07 NOTE — Patient Instructions (Signed)

## 2011-12-12 ENCOUNTER — Telehealth (INDEPENDENT_AMBULATORY_CARE_PROVIDER_SITE_OTHER): Payer: Self-pay

## 2011-12-12 NOTE — Telephone Encounter (Signed)
Pt calling wanting to know the status of him cardiac clearance. Pls call pt to let him know the status of the clearance.

## 2011-12-17 ENCOUNTER — Telehealth (INDEPENDENT_AMBULATORY_CARE_PROVIDER_SITE_OTHER): Payer: Self-pay | Admitting: Surgery

## 2011-12-18 ENCOUNTER — Telehealth (INDEPENDENT_AMBULATORY_CARE_PROVIDER_SITE_OTHER): Payer: Self-pay | Admitting: Surgery

## 2011-12-20 ENCOUNTER — Other Ambulatory Visit: Payer: Self-pay

## 2011-12-20 ENCOUNTER — Other Ambulatory Visit (INDEPENDENT_AMBULATORY_CARE_PROVIDER_SITE_OTHER): Payer: Medicare Other

## 2011-12-20 DIAGNOSIS — I5022 Chronic systolic (congestive) heart failure: Secondary | ICD-10-CM

## 2011-12-20 DIAGNOSIS — Z79899 Other long term (current) drug therapy: Secondary | ICD-10-CM

## 2011-12-24 ENCOUNTER — Telehealth: Payer: Self-pay | Admitting: *Deleted

## 2011-12-24 NOTE — Telephone Encounter (Signed)
Pt aware he will be notified of results once reviewed by ordering provider.

## 2011-12-24 NOTE — Telephone Encounter (Signed)
Patient was told by Harriett to call this morning to get his results.

## 2011-12-27 ENCOUNTER — Telehealth: Payer: Self-pay | Admitting: *Deleted

## 2011-12-27 NOTE — Telephone Encounter (Signed)
Notes Recorded by Lesle Chris, LPN on 08/25/1094 at 2:26 PM Left message to return call.

## 2011-12-27 NOTE — Telephone Encounter (Signed)
Message copied by Murriel Hopper on Thu Dec 27, 2011  2:26 PM ------      Message from: Prescott Parma C      Created: Wed Dec 26, 2011 11:14 AM       EF 50-55% by current 2D echo. This represents significant improvement from prior 30-35%. Therefore, pt is cleared to proceed with Cholecystectomy, as planned, with no further cardiac workup.

## 2011-12-28 ENCOUNTER — Other Ambulatory Visit (INDEPENDENT_AMBULATORY_CARE_PROVIDER_SITE_OTHER): Payer: Self-pay | Admitting: Surgery

## 2012-01-09 NOTE — Telephone Encounter (Signed)
Notes Recorded by Lesle Chris, LPN on 05/03/7828 at 9:57 AM Patient notified and verbalized understanding. Will fax info to [facility].

## 2012-01-15 ENCOUNTER — Encounter (HOSPITAL_COMMUNITY): Payer: Self-pay | Admitting: Pharmacy Technician

## 2012-01-16 ENCOUNTER — Inpatient Hospital Stay (HOSPITAL_COMMUNITY): Admission: RE | Admit: 2012-01-16 | Discharge: 2012-01-16 | Payer: Medicare Other | Source: Ambulatory Visit

## 2012-01-16 ENCOUNTER — Encounter (HOSPITAL_COMMUNITY): Payer: Self-pay

## 2012-01-16 NOTE — Pre-Procedure Instructions (Signed)
[address]  01/16/2012   Your procedure is scheduled on:  June 11th, Tuesday   Report to [facility] at  7:30 AM.  Call this number if you have problems the morning of surgery: [phone]   Remember:   Do not eat food:After Midnight  Monday.  May have clear liquids: up to 4 Hours before arrival time --- 3:30 AM.  Clear liquids include soda, tea, black coffee, apple or grape juice, broth.   Take these medicines the morning of surgery with A SIP OF WATER: Coreg and               Lortab   Do not wear jewelry, make-up or nail polish.  Do not wear lotions, powders, or perfumes. You may wear deodorant.  Do not shave 48 hours prior to surgery. Men may shave face and neck.   Do not bring valuables to the hospital.  Contacts, dentures or bridgework may not be worn into surgery.  Leave suitcase in the car. After surgery it may be brought to your room.  For patients admitted to the hospital, checkout time is 11:00 AM the day of discharge.   Patients discharged the day of surgery will not be allowed to drive home.  Name and phone number of your driver:   Special Instructions: CHG Shower Use Special Wash: 1/2 bottle night before surgery and 1/2 bottle morning of surgery.   Please read over the following fact sheets that you were given: Pain Booklet, Coughing and Deep Breathing, MRSA Information and Surgical Site Infection Prevention

## 2012-01-22 ENCOUNTER — Encounter (HOSPITAL_COMMUNITY)
Admission: RE | Admit: 2012-01-22 | Discharge: 2012-01-22 | Disposition: A | Discharge status: Home / Self Care | Payer: Medicare Other | Source: Ambulatory Visit | Attending: Surgery | Admitting: Surgery

## 2012-01-22 ENCOUNTER — Encounter (HOSPITAL_COMMUNITY): Payer: Self-pay | Admitting: Vascular Surgery

## 2012-01-22 LAB — DIFFERENTIAL
Basophils Absolute: 0.1 10*3/uL (ref 0.0–0.1)
Lymphocytes Relative: 25 % (ref 12–46)
Monocytes Absolute: 0.4 10*3/uL (ref 0.1–1.0)
Neutro Abs: 3.9 10*3/uL (ref 1.7–7.7)

## 2012-01-22 LAB — COMPREHENSIVE METABOLIC PANEL
AST: 13 U/L (ref 0–37)
Albumin: 4.2 g/dL (ref 3.5–5.2)
CO2: 27 mEq/L (ref 19–32)
Calcium: 10.2 mg/dL (ref 8.4–10.5)
Creatinine, Ser: 1.22 mg/dL (ref 0.50–1.35)
GFR calc non Af Amer: 56 mL/min — ABNORMAL LOW (ref >90)
Total Protein: 7.4 g/dL (ref 6.0–8.3)

## 2012-01-22 LAB — CBC
MCH: 30.4 pg (ref 26.0–34.0)
MCHC: 33.7 g/dL (ref 30.0–36.0)
MCV: 90.2 fL (ref 78.0–100.0)
Platelets: 144 10*3/uL — ABNORMAL LOW (ref 150–400)
RDW: 13.8 % (ref 11.5–15.5)

## 2012-01-22 NOTE — Consult Note (Addendum)
Anesthesia Chart Review:  Patient is a 78 year old male scheduled for a laparoscopic cholecystectomy on 01/29/12.  History is significant for hospitalization in February 2013 for STEMI with cath showing non-obstructive CAD with moderate to severe LV dysfunction consistent with Takotsubo cardiomyopathy which was felt due to stress in the setting of cholecystitis s/p percutaneous drain.    Other history includes non-smoker, HTN, HLD, single seizure at age 84, renal cancer s/p right nephrectomy, chronic back pain, prior transfusion, history of right TKA, bilateral cataract extraction 2012.  His Cardiologist is Dr. Earnestine Leys.  He was cleared for this procedure after a follow-up echo showed his EF improved from 30-35% to 50-55% (see below).  Echo on 12/20/11 showed: - Left ventricle: The cavity size was normal. Wall thickness was increased in a pattern of mild LVH. There was mild concentric hypertrophy. Systolic function was normal. The estimated ejection fraction was in the range of 50% to 55%. Wall motion was normal; there were no regional wall motion abnormalities. Doppler parameters are consistent with abnormal left ventricular relaxation (grade 1 diastolic dysfunction). There was no evidence of elevated ventricular filling pressure by Doppler parameters. - Aortic valve: Valve area: 4.27cm^2(VTI). Valve area: 4.3cm^2 (Vmax). - Aorta: The aorta was moderately dilated. - Trivial TR.  Cardiac cath on 10/06/11 (Dr. [place]) showed: 1. Nonobstructive coronary disease (20-30% LAD, Ramus INT 20%, LCx 20-30%, normal left main and RCA). 2. Moderate to severe left ventricular dysfunction (EF 35%) with characteristic appearance of Takotsubo cardiomyopathy.   His PAT appointment is on 01/22/12.  I'll follow-up lab, CXR, repeat EKG results when available.  Addendum: 01/22/12 1415  CXR and labs noted.  An EKG was not done at his PAT appointment.  His last one on 10/07/11 showed new significant anterolateral ST  changes (done during his hospitalization for Takotsubo CM).  I don't see that one has been done since, so I will repeat on the day of surgery to get a baseline.   Shonna Chock, PA-C

## 2012-01-28 MED ORDER — CEFAZOLIN SODIUM-DEXTROSE 2-3 GM-% IV SOLR
2.0000 g | INTRAVENOUS | Status: AC
  Administered 2012-01-29: 2 g via INTRAVENOUS
  Filled 2012-01-28: qty 50

## 2012-01-29 ENCOUNTER — Encounter (HOSPITAL_COMMUNITY)
Admission: RE | Disposition: A | Discharge status: Home / Self Care | Payer: Self-pay | Source: Ambulatory Visit | Attending: Surgery

## 2012-01-29 ENCOUNTER — Ambulatory Visit (HOSPITAL_BASED_OUTPATIENT_CLINIC_OR_DEPARTMENT_OTHER): Admit: 2012-01-29 | Payer: Self-pay | Admitting: Surgery

## 2012-01-29 ENCOUNTER — Ambulatory Visit (HOSPITAL_COMMUNITY): Payer: Medicare Other

## 2012-01-29 ENCOUNTER — Ambulatory Visit (HOSPITAL_COMMUNITY): Payer: Medicare Other | Admitting: Vascular Surgery

## 2012-01-29 ENCOUNTER — Encounter (HOSPITAL_COMMUNITY): Payer: Self-pay | Admitting: *Deleted

## 2012-01-29 ENCOUNTER — Encounter (HOSPITAL_BASED_OUTPATIENT_CLINIC_OR_DEPARTMENT_OTHER): Payer: Self-pay

## 2012-01-29 ENCOUNTER — Encounter (HOSPITAL_COMMUNITY): Payer: Self-pay | Admitting: Vascular Surgery

## 2012-01-29 ENCOUNTER — Encounter (HOSPITAL_COMMUNITY): Payer: Self-pay | Admitting: Surgery

## 2012-01-29 ENCOUNTER — Ambulatory Visit (HOSPITAL_COMMUNITY)
Admission: RE | Admit: 2012-01-29 | Discharge: 2012-01-29 | Disposition: A | Discharge status: Home / Self Care | Payer: Medicare Other | Source: Ambulatory Visit | Attending: Surgery | Admitting: Surgery

## 2012-01-29 DIAGNOSIS — Z01818 Encounter for other preprocedural examination: Secondary | ICD-10-CM | POA: Insufficient documentation

## 2012-01-29 DIAGNOSIS — K819 Cholecystitis, unspecified: Secondary | ICD-10-CM

## 2012-01-29 DIAGNOSIS — Z85528 Personal history of other malignant neoplasm of kidney: Secondary | ICD-10-CM | POA: Insufficient documentation

## 2012-01-29 DIAGNOSIS — N189 Chronic kidney disease, unspecified: Secondary | ICD-10-CM | POA: Insufficient documentation

## 2012-01-29 DIAGNOSIS — K8010 Calculus of gallbladder with chronic cholecystitis without obstruction: Secondary | ICD-10-CM | POA: Insufficient documentation

## 2012-01-29 DIAGNOSIS — I129 Hypertensive chronic kidney disease with stage 1 through stage 4 chronic kidney disease, or unspecified chronic kidney disease: Secondary | ICD-10-CM | POA: Insufficient documentation

## 2012-01-29 DIAGNOSIS — I10 Essential (primary) hypertension: Secondary | ICD-10-CM

## 2012-01-29 DIAGNOSIS — I5181 Takotsubo syndrome: Secondary | ICD-10-CM

## 2012-01-29 DIAGNOSIS — I2510 Atherosclerotic heart disease of native coronary artery without angina pectoris: Secondary | ICD-10-CM | POA: Insufficient documentation

## 2012-01-29 DIAGNOSIS — I213 ST elevation (STEMI) myocardial infarction of unspecified site: Secondary | ICD-10-CM

## 2012-01-29 DIAGNOSIS — E785 Hyperlipidemia, unspecified: Secondary | ICD-10-CM | POA: Insufficient documentation

## 2012-01-29 DIAGNOSIS — N289 Disorder of kidney and ureter, unspecified: Secondary | ICD-10-CM

## 2012-01-29 DIAGNOSIS — I252 Old myocardial infarction: Secondary | ICD-10-CM | POA: Insufficient documentation

## 2012-01-29 DIAGNOSIS — I38 Endocarditis, valve unspecified: Secondary | ICD-10-CM

## 2012-01-29 DIAGNOSIS — I498 Other specified cardiac arrhythmias: Secondary | ICD-10-CM

## 2012-01-29 DIAGNOSIS — Z01812 Encounter for preprocedural laboratory examination: Secondary | ICD-10-CM | POA: Insufficient documentation

## 2012-01-29 DIAGNOSIS — Z8679 Personal history of other diseases of the circulatory system: Secondary | ICD-10-CM

## 2012-01-29 HISTORY — PX: CHOLECYSTECTOMY: SHX55

## 2012-01-29 SURGERY — LAPAROSCOPIC CHOLECYSTECTOMY WITH INTRAOPERATIVE CHOLANGIOGRAM
Anesthesia: General

## 2012-01-29 SURGERY — LAPAROSCOPIC CHOLECYSTECTOMY WITH INTRAOPERATIVE CHOLANGIOGRAM
Anesthesia: General | Site: Abdomen | Wound class: Clean Contaminated

## 2012-01-29 MED ORDER — SODIUM CHLORIDE 0.9 % IV SOLN
INTRAVENOUS | Status: DC | PRN
  Administered 2012-01-29: 08:00:00

## 2012-01-29 MED ORDER — FENTANYL CITRATE 0.05 MG/ML IJ SOLN
INTRAMUSCULAR | Status: DC | PRN
  Administered 2012-01-29: 75 ug via INTRAVENOUS
  Administered 2012-01-29: 125 ug via INTRAVENOUS
  Administered 2012-01-29: 50 ug via INTRAVENOUS

## 2012-01-29 MED ORDER — OXYCODONE-ACETAMINOPHEN 5-325 MG PO TABS: 1.0000 | ORAL_TABLET | ORAL | Status: AC | PRN

## 2012-01-29 MED ORDER — ACETAMINOPHEN 325 MG PO TABS
650.0000 mg | ORAL_TABLET | ORAL | Status: DC | PRN
  Filled 2012-01-29: qty 2

## 2012-01-29 MED ORDER — PROPOFOL 10 MG/ML IV EMUL
INTRAVENOUS | Status: DC | PRN
  Administered 2012-01-29: 170 mg via INTRAVENOUS

## 2012-01-29 MED ORDER — SODIUM CHLORIDE 0.9 % IV SOLN: 250.0000 mL | INTRAVENOUS | Status: DC | PRN

## 2012-01-29 MED ORDER — HYDROMORPHONE HCL PF 1 MG/ML IJ SOLN
INTRAMUSCULAR | Status: AC
  Filled 2012-01-29: qty 1

## 2012-01-29 MED ORDER — HYDROMORPHONE HCL PF 1 MG/ML IJ SOLN
0.2500 mg | INTRAMUSCULAR | Status: DC | PRN
  Administered 2012-01-29 (×4): 0.5 mg via INTRAVENOUS

## 2012-01-29 MED ORDER — MIDAZOLAM HCL 5 MG/5ML IJ SOLN
INTRAMUSCULAR | Status: DC | PRN
  Administered 2012-01-29: 1 mg via INTRAVENOUS

## 2012-01-29 MED ORDER — ONDANSETRON HCL 4 MG/2ML IJ SOLN
INTRAMUSCULAR | Status: DC | PRN
  Administered 2012-01-29: 4 mg via INTRAVENOUS

## 2012-01-29 MED ORDER — MORPHINE SULFATE 2 MG/ML IJ SOLN: 1.0000 mg | INTRAMUSCULAR | Status: DC | PRN

## 2012-01-29 MED ORDER — LIDOCAINE HCL (CARDIAC) 20 MG/ML IV SOLN
INTRAVENOUS | Status: DC | PRN
  Administered 2012-01-29: 100 mg via INTRAVENOUS

## 2012-01-29 MED ORDER — ACETAMINOPHEN 650 MG RE SUPP
650.0000 mg | RECTAL | Status: DC | PRN
  Filled 2012-01-29: qty 1

## 2012-01-29 MED ORDER — OXYCODONE HCL 5 MG PO TABS
5.0000 mg | ORAL_TABLET | ORAL | Status: DC | PRN
  Administered 2012-01-29: 10 mg via ORAL

## 2012-01-29 MED ORDER — 0.9 % SODIUM CHLORIDE (POUR BTL) OPTIME
TOPICAL | Status: DC | PRN
  Administered 2012-01-29: 1000 mL

## 2012-01-29 MED ORDER — ONDANSETRON HCL 4 MG/2ML IJ SOLN: 4.0000 mg | Freq: Four times a day (QID) | INTRAMUSCULAR | Status: DC | PRN

## 2012-01-29 MED ORDER — LACTATED RINGERS IV SOLN
INTRAVENOUS | Status: DC | PRN
  Administered 2012-01-29 (×2): via INTRAVENOUS

## 2012-01-29 MED ORDER — SODIUM CHLORIDE 0.9 % IJ SOLN: 3.0000 mL | INTRAMUSCULAR | Status: DC | PRN

## 2012-01-29 MED ORDER — HYDRALAZINE HCL 20 MG/ML IJ SOLN
INTRAMUSCULAR | Status: DC | PRN
  Administered 2012-01-29: 10 mg via INTRAVENOUS

## 2012-01-29 MED ORDER — ROCURONIUM BROMIDE 100 MG/10ML IV SOLN
INTRAVENOUS | Status: DC | PRN
  Administered 2012-01-29: 40 mg via INTRAVENOUS
  Administered 2012-01-29: 10 mg via INTRAVENOUS

## 2012-01-29 MED ORDER — BUPIVACAINE-EPINEPHRINE 0.25% -1:200000 IJ SOLN
INTRAMUSCULAR | Status: DC | PRN
  Administered 2012-01-29: 5 mL

## 2012-01-29 MED ORDER — SODIUM CHLORIDE 0.9 % IJ SOLN: 3.0000 mL | Freq: Two times a day (BID) | INTRAMUSCULAR | Status: DC

## 2012-01-29 MED ORDER — OXYCODONE HCL 5 MG PO TABS
ORAL_TABLET | ORAL | Status: AC
  Filled 2012-01-29: qty 2

## 2012-01-29 MED ORDER — ONDANSETRON HCL 4 MG/2ML IJ SOLN: 4.0000 mg | Freq: Once | INTRAMUSCULAR | Status: DC | PRN

## 2012-01-29 SURGICAL SUPPLY — 42 items
APPLIER CLIP 5 13 M/L LIGAMAX5 (MISCELLANEOUS) ×4
APPLIER CLIP ROT 10 11.4 M/L (STAPLE)
BLADE SURG ROTATE 9660 (MISCELLANEOUS) ×2 IMPLANT
CANISTER SUCTION 2500CC (MISCELLANEOUS) ×2 IMPLANT
CHLORAPREP W/TINT 26ML (MISCELLANEOUS) ×2 IMPLANT
CLIP APPLIE 5 13 M/L LIGAMAX5 (MISCELLANEOUS) ×2 IMPLANT
CLIP APPLIE ROT 10 11.4 M/L (STAPLE) IMPLANT
CLOTH BEACON ORANGE TIMEOUT ST (SAFETY) ×2 IMPLANT
COVER MAYO STAND STRL (DRAPES) ×2 IMPLANT
COVER SURGICAL LIGHT HANDLE (MISCELLANEOUS) ×2 IMPLANT
DECANTER SPIKE VIAL GLASS SM (MISCELLANEOUS) IMPLANT
DERMABOND ADVANCED (GAUZE/BANDAGES/DRESSINGS) ×1
DERMABOND ADVANCED .7 DNX12 (GAUZE/BANDAGES/DRESSINGS) ×1 IMPLANT
DRAPE C-ARM 42X72 X-RAY (DRAPES) ×2 IMPLANT
DRAPE UTILITY 15X26 W/TAPE STR (DRAPE) ×4 IMPLANT
DRAPE WARM FLUID 44X44 (DRAPE) ×2 IMPLANT
ELECT REM PT RETURN 9FT ADLT (ELECTROSURGICAL) ×2
ELECTRODE REM PT RTRN 9FT ADLT (ELECTROSURGICAL) ×1 IMPLANT
GLOVE BIO SURGEON STRL SZ7.5 (GLOVE) ×2 IMPLANT
GLOVE BIO SURGEON STRL SZ8 (GLOVE) ×4 IMPLANT
GLOVE BIOGEL PI IND STRL 7.5 (GLOVE) ×2 IMPLANT
GLOVE BIOGEL PI IND STRL 8 (GLOVE) ×1 IMPLANT
GLOVE BIOGEL PI INDICATOR 7.5 (GLOVE) ×2
GLOVE BIOGEL PI INDICATOR 8 (GLOVE) ×1
GOWN STRL NON-REIN LRG LVL3 (GOWN DISPOSABLE) ×2 IMPLANT
KIT BASIN OR (CUSTOM PROCEDURE TRAY) ×2 IMPLANT
KIT ROOM TURNOVER OR (KITS) ×2 IMPLANT
NS IRRIG 1000ML POUR BTL (IV SOLUTION) ×2 IMPLANT
PAD ARMBOARD 7.5X6 YLW CONV (MISCELLANEOUS) ×2 IMPLANT
POUCH SPECIMEN RETRIEVAL 10MM (ENDOMECHANICALS) ×2 IMPLANT
SCISSORS LAP 5X35 DISP (ENDOMECHANICALS) IMPLANT
SET CHOLANGIOGRAPH 5 50 .035 (SET/KITS/TRAYS/PACK) ×2 IMPLANT
SET IRRIG TUBING LAPAROSCOPIC (IRRIGATION / IRRIGATOR) ×2 IMPLANT
SLEEVE ENDOPATH XCEL 5M (ENDOMECHANICALS) ×2 IMPLANT
SPECIMEN JAR SMALL (MISCELLANEOUS) ×2 IMPLANT
SUT MNCRL AB 4-0 PS2 18 (SUTURE) ×2 IMPLANT
TOWEL OR 17X24 6PK STRL BLUE (TOWEL DISPOSABLE) ×2 IMPLANT
TOWEL OR 17X26 10 PK STRL BLUE (TOWEL DISPOSABLE) ×2 IMPLANT
TRAY LAPAROSCOPIC (CUSTOM PROCEDURE TRAY) ×2 IMPLANT
TROCAR XCEL BLUNT TIP 100MML (ENDOMECHANICALS) ×2 IMPLANT
TROCAR XCEL NON-BLD 11X100MML (ENDOMECHANICALS) ×2 IMPLANT
TROCAR XCEL NON-BLD 5MMX100MML (ENDOMECHANICALS) ×2 IMPLANT

## 2012-01-29 NOTE — Progress Notes (Signed)
CALLED DR CORNETT'S OFFICE TO CHECK ON WHEN CLIENT MAY RESUME ASPIRIN AND PER TRACY NURSE AT THE OFFICE, CLIENT MAY RESUME ASPIRIN TOMORROW AND CLIENT NOTIFIED AND VOICED UNDERSTANDING

## 2012-01-29 NOTE — Transfer of Care (Signed)
Immediate Anesthesia Transfer of Care Note  Patient: Darius Baxter  Procedure(s) Performed: Procedure(s) (LRB): LAPAROSCOPIC CHOLECYSTECTOMY WITH INTRAOPERATIVE CHOLANGIOGRAM (N/A)  Patient Location: PACU  Anesthesia Type: General  Level of Consciousness: awake and alert   Airway & Oxygen Therapy: Patient Spontanous Breathing and Patient connected to nasal cannula oxygen  Post-op Assessment: Report given to PACU RN, Post -op Vital signs reviewed and stable and Patient moving all extremities  Post vital signs: Reviewed and stable  Complications: No apparent anesthesia complications

## 2012-01-29 NOTE — Interval H&P Note (Signed)
History and Physical Interval Note:  01/29/2012 7:18 AM  Darius Baxter  has presented today for surgery, with the diagnosis of chronic cholecystitis  The various methods of treatment have been discussed with the patient and family. After consideration of risks, benefits and other options for treatment, the patient has consented to  Procedure(s) (LRB): LAPAROSCOPIC CHOLECYSTECTOMY WITH INTRAOPERATIVE CHOLANGIOGRAM (N/A) as a surgical intervention .  The patients' history has been reviewed, patient examined, no change in status, stable for surgery.  I have reviewed the patients' chart and labs.  Questions were answered to the patient's satisfaction.     Darius Baxter A.

## 2012-01-29 NOTE — Op Note (Signed)
Laparoscopic Cholecystectomy with IOC Procedure Note  Indications: This patient presents with symptomatic gallbladder disease and will undergo laparoscopic cholecystectomy. Pt has had an MI and a percutaneous drain was placed until stable for surgery.The procedure has been discussed with the patient. Operative and non operative treatments have been discussed. Risks of surgery include bleeding, infection,  Common bile duct injury,  Injury to the stomach,liver, colon,small intestine, abdominal wall,  Diaphragm,  Major blood vessels,  And the need for an open procedure.  Other risks include worsening of medical problems, death,  DVT and pulmonary embolism, and cardiovascular events.   Medical options have also been discussed. The patient has been informed of long term expectations of surgery and non surgical options,  The patient agrees to proceed.    Pre-operative Diagnosis: Chronic cholecystitis  Post-operative Diagnosis: Same  Surgeon: Castle Lamons A.   Assistants: or staff  Anesthesia: General endotracheal anesthesia and Local anesthesia 0.25.% bupivacaine, with epinephrine  ASA Class: 3  Procedure Details  The patient was seen again in the Holding Room. The risks, benefits, complications, treatment options, and expected outcomes were discussed with the patient. The possibilities of reaction to medication, pulmonary aspiration, perforation of viscus, bleeding, recurrent infection, finding a normal gallbladder, the need for additional procedures, failure to diagnose a condition, the possible need to convert to an open procedure, and creating a complication requiring transfusion or operation were discussed with the patient. The patient and/or family concurred with the proposed plan, giving informed consent. The site of surgery properly noted/marked. The patient was taken to Operating Room, identified as Darius Baxter and the procedure verified as Laparoscopic Cholecystectomy with Intraoperative  Cholangiograms. A Time Out was held and the above information confirmed.  Prior to the induction of general anesthesia, antibiotic prophylaxis was administered. General endotracheal anesthesia was then administered and tolerated well. After the induction, the abdomen was prepped in the usual sterile fashion. The patient was positioned in the supine position with the left arm comfortably tucked, along with some reverse Trendelenburg.  Local anesthetic agent was injected into the skin near the umbilicus and an incision made. The midline fascia was incised and the Hasson technique was used to introduce a 10 mm port under direct vision. It was secured with a figure of eight Vicryl suture placed in the usual fashion. Pneumoperitoneum was then created with CO2 and tolerated well without any adverse changes in the patient's vital signs. Additional trocars were introduced under direct vision. All skin incisions were infiltrated with a local anesthetic agent before making the incision and placing the trocars.   The gallbladder was identified, the fundus grasped and retracted cephalad. Adhesions were lysed bluntly and with the electrocautery where indicated, taking care not to injure any adjacent organs or viscus. The infundibulum was grasped and retracted laterally, exposing the peritoneum overlying the triangle of Calot. This was then divided and exposed in a blunt fashion. The cystic duct was clearly identified and bluntly dissected circumferentially. The junctions of the gallbladder, cystic duct and common bile duct were clearly identified prior to the division of any linear structure.   An incision was made in the cystic duct and the cholangiogram catheter introduced. The catheter was secured using an endoclip. The study showed no stones and good visualization of the distal and proximal biliary tree. The catheter was then removed.   The cystic duct was then  ligated with surgical clips  on the patient side and   clipped on the gallbladder side and divided. The cystic  artery was identified, dissected free, ligated with clips and divided as well. Posterior cystic artery clipped and divided.  The gallbladder was dissected from the liver bed in retrograde fashion with the electrocautery. The gallbladder was removed. The liver bed was irrigated and inspected. Hemostasis was achieved with the electrocautery. Copious irrigation was utilized and was repeatedly aspirated until clear all particulate matter.  Pneumoperitoneum was completely reduced after viewing removal of the trocars under direct vision. The wound was thoroughly irrigated and the fascia was then closed with a figure of eight suture; the skin was then closed with 4 0 monocryl and a sterile dressing was applied.  Instrument, sponge, and needle counts were correct at closure and at the conclusion of the case.   Findings: Cholecystitis with Cholelithiasis  Estimated Blood Loss: less than 50 mL         Drains: none         Total IV Fluids: 600 mL         Specimens: Gallbladder           Complications: None; patient tolerated the procedure well.         Disposition: PACU - hemodynamically stable.         Condition: stable

## 2012-01-29 NOTE — Anesthesia Postprocedure Evaluation (Signed)
  Anesthesia Post-op Note  Patient: Darius Baxter  Procedure(s) Performed: Procedure(s) (LRB): LAPAROSCOPIC CHOLECYSTECTOMY WITH INTRAOPERATIVE CHOLANGIOGRAM (N/A)  Patient Location: PACU  Anesthesia Type: General  Level of Consciousness: awake, oriented, sedated and patient cooperative  Airway and Oxygen Therapy: Patient Spontanous Breathing and Patient connected to nasal cannula oxygen  Post-op Pain: mild  Post-op Assessment: Post-op Vital signs reviewed, Patient's Cardiovascular Status Stable, Respiratory Function Stable, Patent Airway, No signs of Nausea or vomiting and Pain level controlled  Post-op Vital Signs: stable  Complications: No apparent anesthesia complications

## 2012-01-29 NOTE — Discharge Instructions (Signed)
[facility] ______[facility] °LAPAROSCOPIC SURGERY: POST OP INSTRUCTIONS °Always review your discharge instruction sheet given to you by the facility where your surgery was performed. °IF YOU HAVE DISABILITY OR FAMILY LEAVE FORMS, YOU MUST BRING THEM TO THE OFFICE FOR PROCESSING.   °DO NOT GIVE THEM TO YOUR DOCTOR. ° °1. A prescription for pain medication may be given to you upon discharge.  Take your pain medication as prescribed, if needed.  If narcotic pain medicine is not needed, then you may take acetaminophen (Tylenol) or ibuprofen (Advil) as needed. °2. Take your usually prescribed medications unless otherwise directed. °3. If you need a refill on your pain medication, please contact your pharmacy.  They will contact our office to request authorization. Prescriptions will not be filled after 5pm or on week-ends. °4. You should follow a light diet the first few days after arrival home, such as soup and crackers, etc.  Be sure to include lots of fluids daily. °5. Most patients will experience some swelling and bruising in the area of the incisions.  Ice packs will help.  Swelling and bruising can take several days to resolve.  °6. It is common to experience some constipation if taking pain medication after surgery.  Increasing fluid intake and taking a stool softener (such as Colace) will usually help or prevent this problem from occurring.  A mild laxative (Milk of Magnesia or Miralax) should be taken according to package instructions if there are no bowel movements after 48 hours. °7. Unless discharge instructions indicate otherwise, you may remove your bandages 24-48 hours after surgery, and you may shower at that time.  You may have steri-strips (small skin tapes) in place directly over the incision.  These strips should be left on the skin for 7-10 days.  If your surgeon used skin glue on the incision, you may shower in 24 hours.  The glue will flake off over the next 2-3 weeks.  Any sutures or  staples will be removed at the office during your follow-up visit. °8. ACTIVITIES:  You may resume regular (light) daily activities beginning the next day--such as daily self-care, walking, climbing stairs--gradually increasing activities as tolerated.  You may have sexual intercourse when it is comfortable.  Refrain from any heavy lifting or straining until approved by your doctor. °a. You may drive when you are no longer taking prescription pain medication, you can comfortably wear a seatbelt, and you can safely maneuver your car and apply brakes. °b. RETURN TO WORK:  __________________________________________________________ °9. You should see your doctor in the office for a follow-up appointment approximately 2-3 weeks after your surgery.  Make sure that you call for this appointment within a day or two after you arrive home to insure a convenient appointment time. °10. OTHER INSTRUCTIONS: __________________________________________________________________________________________________________________________ __________________________________________________________________________________________________________________________ °WHEN TO CALL YOUR DOCTOR: °1. Fever over 101.0 °2. Inability to urinate °3. Continued bleeding from incision. °4. Increased pain, redness, or drainage from the incision. °5. Increasing abdominal pain ° °The clinic staff is available to answer your questions during regular business hours.  Please don’t hesitate to call and ask to speak to one of the nurses for clinical concerns.  If you have a medical emergency, go to the nearest emergency room or call 911.  A surgeon from [facility] is always on call at the hospital. °[address], [address], [place], [place]  [address] ? [address] [address], [place],    [address] °[phone] ? [phone] ? FAX [phone] °Web site:   [email or web address] °

## 2012-01-29 NOTE — Progress Notes (Signed)
CLIENT STATES I AM READY TO GO HOME. CLIENT VOIDED. NO C/O NAUSEA. PAIN LEVEL DECREASED FROM 6 TO 3

## 2012-01-29 NOTE — Progress Notes (Signed)
Report given to sharon rn as caregiver 

## 2012-01-29 NOTE — Preoperative (Signed)
Beta Blockers   Reason not to administer Beta Blockers:Coreg at 0420hrs 01/29/2012

## 2012-01-29 NOTE — H&P (Signed)
Darius Baxter is an 78 y.o. male.   Chief Complaint: GALLSTONES HPI: History of chronic cholecystitis.  Here for lap chole.  Past Medical History  Diagnosis Date  . HTN (hypertension)   . HLD (hyperlipidemia)   . Ventricular ectopy     bigeminy  . Valvular heart disease     mild MVP/MR by 2-D echo, 8/10  . Seizures     had 1 seizure at age 34,unknown orgin.no more since and on no meds  . Chronic kidney disease     had right kidney removed for cancer-5 yrs ago  . Cancer     right kidney  . Arthritis   . Chronic back pain     due to arthritis  . Blood transfusion     last transfusion 3.5 yrs ago....with tka on right  . Takotsubo cardiomyopathy     February 2013    Past Surgical History  Procedure Date  . Nephrectomy     right nephrectomy for cancer  . Appendectomy   . Knee arthroscopy     right knee  . Right total knee 3 yrs ago    right 3 yrs ago at [facility]  . Cataract extraction w/phaco 03/22/2011    Procedure: CATARACT EXTRACTION PHACO AND INTRAOCULAR LENS PLACEMENT (IOC);  Surgeon: Gemma Payor;  Location: [facility];  Service: Ophthalmology;  Laterality: Right;  CDE  19.88  . Cataract extraction w/phaco 04/16/2011    Procedure: CATARACT EXTRACTION PHACO AND INTRAOCULAR LENS PLACEMENT (IOC);  Surgeon: Gemma Payor;  Location: [facility];  Service: Ophthalmology;  Laterality: Left;  CDE 21.13  . Joint replacement     right knee replaced  2010    Family History  Problem Relation Age of Onset  . Cancer Other   . Stroke Other   . Coronary artery disease Other   . Hypertension Other   . Anesthesia problems Neg Hx   . Hypotension Neg Hx   . Malignant hyperthermia Neg Hx   . Pseudochol deficiency Neg Hx    Social History:  reports that he has never smoked. He does not have any smokeless tobacco history on file. He reports that he does not drink alcohol or use illicit drugs.  Allergies:  Allergies  Allergen Reactions  . Aleve (Naproxen Sodium)     Patient has only 1 kidney and  Doctor  Doesn't want him to take    Medications Prior to Admission  Medication Sig Dispense Refill  . aspirin 81 MG EC tablet Take 81 mg by mouth daily.        Marland Kitchen atorvastatin (LIPITOR) 10 MG tablet Take 10 mg by mouth every evening.      . carvedilol (COREG) 6.25 MG tablet Take 6.25 mg by mouth 2 (two) times daily with a meal.      . furosemide (LASIX) 20 MG tablet Take 20 mg by mouth daily.       Marland Kitchen HYDROcodone-acetaminophen (LORTAB) 7.5-500 MG per tablet Take 1 tablet by mouth 2 (two) times a week. Wed, and sun mornings      . lisinopril (PRINIVIL,ZESTRIL) 40 MG tablet Take 40 mg by mouth every evening.        No results found for this or any previous visit (from the past 48 hour(s)). No results found.  Review of Systems  Constitutional: Negative.   HENT: Negative.   Eyes: Negative.   Respiratory: Negative.   Cardiovascular: Negative.   Gastrointestinal: Negative.   Genitourinary: Negative.   Musculoskeletal: Negative.  Skin: Negative.   Neurological: Negative.   Endo/Heme/Allergies: Negative.   Psychiatric/Behavioral: Negative.     Blood pressure 151/84, pulse 50, temperature 98.1 F (36.7 C), temperature source Oral, resp. rate 18, SpO2 96.00%. Physical Exam  Constitutional: He appears well-developed and well-nourished.  HENT:  Head: Normocephalic and atraumatic.  Eyes: EOM are normal. Pupils are equal, round, and reactive to light.  Neck: Normal range of motion. Neck supple.  Cardiovascular: Normal rate and regular rhythm.   Respiratory: Effort normal.  GI:    Skin: Skin is warm and dry.  Psychiatric: He has a normal mood and affect. His behavior is normal. Judgment and thought content normal.     Assessment/Plan Chronic cholecystitis Lap chole /IOC  The procedure has been discussed with the patient. Operative and non operative treatments have been discussed. Risks of surgery include bleeding, infection,  Common bile duct injury,  Injury to the stomach,liver,  colon,small intestine, abdominal wall,  Diaphragm,  Major blood vessels,  And the need for an open procedure.  Other risks include worsening of medical problems, death,  DVT and pulmonary embolism, and cardiovascular events.   Medical options have also been discussed. The patient has been informed of long term expectations of surgery and non surgical options,  The patient agrees to proceed.    Refoel Palladino A. 01/29/2012, 7:16 AM

## 2012-01-29 NOTE — Anesthesia Preprocedure Evaluation (Addendum)
Anesthesia Evaluation  Patient identified by MRN, date of birth, ID band Patient awake    Reviewed: Allergy & Precautions, H&P , NPO status , Patient's Chart, lab work & pertinent test results, reviewed documented beta blocker date and time   Airway Mallampati: II TM Distance: >3 FB Neck ROM: full    Dental  (+) Edentulous Upper   Pulmonary          Cardiovascular hypertension, Pt. on medications and Pt. on home beta blockers + CAD and + Past MI Rhythm:regular Rate:Normal     Neuro/Psych Seizures -,     GI/Hepatic   Endo/Other    Renal/GU      Musculoskeletal   Abdominal   Peds  Hematology   Anesthesia Other Findings   Reproductive/Obstetrics                          Anesthesia Physical Anesthesia Plan  ASA: III  Anesthesia Plan: General   Post-op Pain Management:    Induction: Intravenous  Airway Management Planned: Oral ETT  Additional Equipment:   Intra-op Plan:   Post-operative Plan: Extubation in OR  Informed Consent: I have reviewed the patients History and Physical, chart, labs and discussed the procedure including the risks, benefits and alternatives for the proposed anesthesia with the patient or authorized representative who has indicated his/her understanding and acceptance.   Dental advisory given  Plan Discussed with: CRNA, Anesthesiologist and Surgeon  Anesthesia Plan Comments:        Anesthesia Quick Evaluation

## 2012-01-31 ENCOUNTER — Other Ambulatory Visit: Payer: Medicare Other

## 2012-01-31 ENCOUNTER — Encounter (HOSPITAL_COMMUNITY): Payer: Self-pay | Admitting: Surgery

## 2012-02-22 ENCOUNTER — Ambulatory Visit (INDEPENDENT_AMBULATORY_CARE_PROVIDER_SITE_OTHER): Payer: Medicare Other | Admitting: Surgery

## 2012-02-22 ENCOUNTER — Encounter (INDEPENDENT_AMBULATORY_CARE_PROVIDER_SITE_OTHER): Payer: Self-pay | Admitting: Surgery

## 2012-02-22 VITALS — BP 136/72 | HR 53 | Temp 97.6°F | Ht 73.0 in | Wt 213.6 lb

## 2012-02-22 DIAGNOSIS — Z9889 Other specified postprocedural states: Secondary | ICD-10-CM

## 2012-02-22 NOTE — Progress Notes (Signed)
NAME: Darius Baxter       DOB: 1934-04-26           DATE: 02/22/2012       ZOX:[id number]   CC: Postop laparoscopic cholecystectomy  HPI:  This patient underwent a laparoscopic cholecystectomy and operative cholangiogram on 01/29/2012. He is in for his first postoperative visit. He notes that his incisional pain has resolved. His preoperative symptoms have improved. He is not having problems with nausea, vomiting, diarrhea, fevers, chills, or urinary symptoms. He is tolerating diet. He feels that he is progressing well and nearly back to normal. PE:  VS: BP 136/72  Pulse 53  Temp 97.6 F (36.4 C) (Temporal)  Ht 6\' 1"  (1.854 m)  Wt 213 lb 9.6 oz (96.888 kg)  BMI 28.18 kg/m2  SpO2 98%  General: The patient is alert and appears comfortable, NAD.  Abdomen: Soft and benign. The incisions are healing nicely. There are no apparent problems.  Data reviewed: IOC:  normal Pathology:  View Image   Impression:  The patient appears to be doing well, with improvement in his symptoms.  Plan:  He may resume full activity and regular diet. He  will followup with [place] on a p.r.n. basis. I did tell him that he may still have some foods that cause indigestion and ask him to call us if there are any questions, problems or concerns.

## 2012-03-13 ENCOUNTER — Ambulatory Visit: Payer: Medicare Other | Admitting: Cardiology

## 2012-03-28 ENCOUNTER — Encounter: Payer: Self-pay | Admitting: Cardiovascular Disease

## 2012-03-28 ENCOUNTER — Ambulatory Visit (INDEPENDENT_AMBULATORY_CARE_PROVIDER_SITE_OTHER): Payer: Medicare Other | Admitting: Cardiovascular Disease

## 2012-03-28 ENCOUNTER — Ambulatory Visit: Payer: Medicare Other | Admitting: Cardiovascular Disease

## 2012-03-28 VITALS — BP 160/80 | HR 56 | Ht 73.0 in | Wt 216.0 lb

## 2012-03-28 DIAGNOSIS — M1710 Unilateral primary osteoarthritis, unspecified knee: Secondary | ICD-10-CM

## 2012-03-28 DIAGNOSIS — E785 Hyperlipidemia, unspecified: Secondary | ICD-10-CM

## 2012-03-28 DIAGNOSIS — I10 Essential (primary) hypertension: Secondary | ICD-10-CM

## 2012-03-28 DIAGNOSIS — I5181 Takotsubo syndrome: Secondary | ICD-10-CM

## 2012-03-28 NOTE — Assessment & Plan Note (Signed)
Clear to have TKR with [facility] in [place] latter this year

## 2012-03-28 NOTE — Assessment & Plan Note (Signed)
Well controlled.  Continue current medications and low sodium Dash type diet.    

## 2012-03-28 NOTE — Assessment & Plan Note (Signed)
Cholesterol is at goal.  Continue current dose of statin and diet Rx.  No myalgias or side effects.  F/U  LFT's in 6 months. Lab Results  Component Value Date   LDLCALC 41 10/06/2011

## 2012-03-28 NOTE — Assessment & Plan Note (Signed)
EF improved to 50-55%  Continue meds No CAD on cath

## 2012-03-28 NOTE — Progress Notes (Signed)
Patient ID: Darius Baxter, male   DOB: Aug 31, 1933, 78 y.o.   MRN: [id number] HPI: Patient presents for post hospital followup, from [facility], following recent direct transferr from [facility] ED, with a diagnosis of code ST EMI.  Clinically, he presented with complaint of mid scapular pain, and was found to have 0.5-1 mm ST segment elevation in leads V1, V2, and aVL. He was taken directly to the Cath Lab, and found to have nonobstructive CAD with moderate LVD (EF 35%). He was diagnosed with Takotsubo cardiomyopathy. 2-D echo yielded EF 30-35%.  F/U as expected showed improved EF  Since then had succesful surgery of his GB.  Needs surgery of left knee with [facility] in [place].  Clear to have this Echo 5/13 Study Conclusions  - Left ventricle: The cavity size was normal. Wall thickness was increased in a pattern of mild LVH. There was mild concentric hypertrophy. Systolic function was normal. The estimated ejection fraction was in the range of 50% to 55%. Wall motion was normal; there were no regional wall motion abnormalities. Doppler parameters are consistent with abnormal left ventricular relaxation (grade 1 diastolic dysfunction). There was no evidence of elevated ventricular filling pressure by Doppler parameters. - Aortic valve: Valve area: 4.27cm^2(VTI). Valve area: 4.3cm^2 (Vmax). - Aorta: The aorta was moderately dilated.    GB surgery with [facility] 6/13  ROS: Denies fever, malais, weight loss, blurry vision, decreased visual acuity, cough, sputum, SOB, hemoptysis, pleuritic pain, palpitaitons, heartburn, abdominal pain, melena, lower extremity edema, claudication, or rash.  All other systems reviewed and negative  General: Affect appropriate Healthy:  appears stated age HEENT: normal Neck supple with no adenopathy JVP normal no bruits no thyromegaly Lungs clear with no wheezing and good diaphragmatic motion Heart:  S1/S2 no murmur, no rub, gallop or click PMI normal Abdomen: benighn,  BS positve, no tenderness, no AAA no bruit.  No HSM or HJR Distal pulses intact with no bruits No edema Neuro non-focal Skin warm and dry No muscular weakness Brace on left knee   Current Outpatient Prescriptions  Medication Sig Dispense Refill  . aspirin 81 MG EC tablet Take 81 mg by mouth daily.        Marland Kitchen atorvastatin (LIPITOR) 10 MG tablet Take 10 mg by mouth every evening.      . carvedilol (COREG) 6.25 MG tablet Take 6.25 mg by mouth 2 (two) times daily with a meal.      . furosemide (LASIX) 20 MG tablet Take 20 mg by mouth daily.       Marland Kitchen HYDROcodone-acetaminophen (LORTAB) 7.5-500 MG per tablet Take 1 tablet by mouth 2 (two) times a week. Wed, and sun mornings       . lisinopril (PRINIVIL,ZESTRIL) 40 MG tablet Take 40 mg by mouth every evening.      Marland Kitchen oxyCODONE-acetaminophen (PERCOCET) 5-325 MG per tablet Take 0.5 tablets by mouth 2 (two) times a week. 1/2 tablet on Wednesday morning and 1/2 tablet on Sunday morning.        Allergies  Aleve  Electrocardiogram:   NSR rate 61 normal ECG  6/13  Assessment and Plan

## 2012-03-28 NOTE — Patient Instructions (Addendum)
Your physician recommends that you schedule a follow-up appointment in: 6 months  

## 2012-06-27 ENCOUNTER — Encounter: Payer: Self-pay | Admitting: Cardiology

## 2012-08-20 DIAGNOSIS — I219 Acute myocardial infarction, unspecified: Secondary | ICD-10-CM

## 2012-08-20 HISTORY — PX: TOTAL KNEE ARTHROPLASTY: SHX125

## 2012-08-20 HISTORY — DX: Acute myocardial infarction, unspecified: I21.9

## 2012-12-31 ENCOUNTER — Ambulatory Visit (INDEPENDENT_AMBULATORY_CARE_PROVIDER_SITE_OTHER): Payer: Medicare Other | Admitting: Cardiology

## 2012-12-31 ENCOUNTER — Encounter: Payer: Self-pay | Admitting: Cardiology

## 2012-12-31 VITALS — BP 130/75 | HR 64 | Ht 73.0 in | Wt 215.0 lb

## 2012-12-31 DIAGNOSIS — I5181 Takotsubo syndrome: Secondary | ICD-10-CM

## 2012-12-31 DIAGNOSIS — I10 Essential (primary) hypertension: Secondary | ICD-10-CM

## 2012-12-31 DIAGNOSIS — E785 Hyperlipidemia, unspecified: Secondary | ICD-10-CM

## 2012-12-31 NOTE — Patient Instructions (Signed)
Continue all current medications. Your physician wants you to follow up in:  1 year.  You will receive a reminder letter in the mail one-two months in advance.  If you don't receive a letter, please call our office to schedule the follow up appointment   

## 2013-01-01 NOTE — Progress Notes (Signed)
Patient ID: Darius Baxter, male   DOB: [birth year], 79 y.o.   MRN: [id number] PCP: Dr. Lysbeth Galas  79 yo with history of Takotsubo cardiomyopathy presents for cardiology followup.  In 4/13, patient presented with severe RUQ pain from symptomatic cholelithiasis.  He developed chest pain, elevated cardiac enzymes, and ST elevation.  He was taken to [facility] for cardiac cath, which showed no obstructive disease but EF was 30-35% with periapical severe hypokinesis.  Followup echo in 5/13 showed EF back up to 50-55%.  Patient had cholecystectomy in 6/13.    Since that time, he has been doing well.  No chest pain or exertional dyspnea.  He does yardwork like push-mowing his grass without problems.  He had left TKR in 11/13 with no complications.    PMH: 1. Takotsubo cardiomyopathy: In setting of symptomatic cholelithiasis/severe RUQ pain in 4/13.  Patient had chest pain, elevated cardiac enzymes, and ST elevation.  LHC showed very minimal nonobstructive CAD.  EF was 30-35% with periapical severe hypokinesis.  Repeat echo in 5/13 showed EF 50-55%, mild LVH.  2. Cholecystectomy in 6/13 (symptomatic cholelithiasis).  3. HTN 4. Hyperlipidemia 5. CKD 6. Renal cell carcinoma s/p right nephrectomy 7. Left TKR 11/13.   SH: Lives in [place], nonsmoker.   FH: mother with MI at 21  ROS: All systems reviewed and negative except as per HPI.   Current Outpatient Prescriptions  Medication Sig Dispense Refill  . aspirin 81 MG EC tablet Take 81 mg by mouth daily.        Marland Kitchen atorvastatin (LIPITOR) 10 MG tablet Take 10 mg by mouth every evening.      . carvedilol (COREG) 6.25 MG tablet Take 6.25 mg by mouth 2 (two) times daily with a meal.      . furosemide (LASIX) 20 MG tablet Take 20 mg by mouth daily.       Marland Kitchen HYDROcodone-acetaminophen (LORTAB) 7.5-500 MG per tablet Take 1 tablet by mouth 2 (two) times a week. Wed, and sun mornings       . lisinopril (PRINIVIL,ZESTRIL) 40 MG tablet Take 40 mg by mouth every  evening.       No current facility-administered medications for this visit.    BP 130/75  Pulse 64  Ht 6\' 1"  (1.854 m)  Wt 215 lb (97.523 kg)  BMI 28.37 kg/m2 General: NAD Neck: No JVD, no thyromegaly or thyroid nodule.  Lungs: Clear to auscultation bilaterally with normal respiratory effort. CV: Nondisplaced PMI.  Heart regular S1/S2, no S3/S4, no murmur.  No peripheral edema.  No carotid bruit.  Normal pedal pulses.  Abdomen: Soft, nontender, no hepatosplenomegaly, no distention.  Neurologic: Alert and oriented x 3.  Psych: Normal affect. Extremities: No clubbing or cyanosis.   Assessment/Plan:  1. Cardiomyopathy: History of Takotsubo cardiomyopathy.  Stressor was severe pain from cholelithiasis.  His gallbladder is now out.  His EF recovered to 50-55% by 3 months after event.  He has no exertional symptoms.  Continue Coreg and lisinopril.  2. HTN: BP is controlled.  3. Hyperlipidemia: We will call Dr. Joyce Copa office for a copy of his last lipids.   Marca Ancona 01/01/2013 10:53 AM

## 2014-01-05 ENCOUNTER — Encounter: Payer: Self-pay | Admitting: Cardiology

## 2014-01-05 ENCOUNTER — Ambulatory Visit (INDEPENDENT_AMBULATORY_CARE_PROVIDER_SITE_OTHER): Payer: Medicare Other | Admitting: Cardiology

## 2014-01-05 VITALS — BP 162/81 | HR 68 | Ht 73.0 in | Wt 219.0 lb

## 2014-01-05 DIAGNOSIS — I493 Ventricular premature depolarization: Secondary | ICD-10-CM | POA: Insufficient documentation

## 2014-01-05 DIAGNOSIS — I4949 Other premature depolarization: Secondary | ICD-10-CM

## 2014-01-05 DIAGNOSIS — E782 Mixed hyperlipidemia: Secondary | ICD-10-CM

## 2014-01-05 DIAGNOSIS — I5181 Takotsubo syndrome: Secondary | ICD-10-CM

## 2014-01-05 DIAGNOSIS — I38 Endocarditis, valve unspecified: Secondary | ICD-10-CM | POA: Insufficient documentation

## 2014-01-05 DIAGNOSIS — I10 Essential (primary) hypertension: Secondary | ICD-10-CM

## 2014-01-05 NOTE — Progress Notes (Signed)
Clinical Summary Mr. Darius Baxter is a 80 y.o.male last seen in May 2014, a former patient of Dr. Aundra Dubin. Cardiac history includesTako-tsubo cardiomyopathy in April 2013 the setting of symptomatic cholelithiasis. Followup echocardiogram in June 2013 demonstrated mild LVH with improvement in LVEF the range of 16-10%, grade 1 diastolic dysfunction. At that time he had no evidence of MVP or significant mitral regurgitation.  ECG today shows sinus rhythm with PVCs.  He reports no chest pain or unusual shortness of breath with his typical ADLs. No hospitalizations in the interim. He continues to follow with Dr. Edrick Oh for routine lab work. He reports compliance with medications. Has had no sense of palpitations, no dizziness or syncope.   Allergies  Allergen Reactions  . Aleve [Naproxen Sodium]     Patient has only 1 kidney and Doctor  Doesn't want him to take    Current Outpatient Prescriptions  Medication Sig Dispense Refill  . alfuzosin (UROXATRAL) 10 MG 24 hr tablet Take 1 tablet by mouth at bedtime.      Marland Kitchen aspirin 81 MG EC tablet Take 81 mg by mouth daily.        Marland Kitchen atorvastatin (LIPITOR) 10 MG tablet Take 10 mg by mouth every evening.      . carvedilol (COREG) 6.25 MG tablet Take 6.25 mg by mouth 2 (two) times daily with a meal.      . furosemide (LASIX) 20 MG tablet Take 20 mg by mouth daily.       Marland Kitchen HYDROcodone-acetaminophen (LORTAB) 7.5-500 MG per tablet Take 1 tablet by mouth 2 (two) times a week. Wed, and sun mornings       . lisinopril (PRINIVIL,ZESTRIL) 40 MG tablet Take 40 mg by mouth every evening.       No current facility-administered medications for this visit.    Past Medical History  Diagnosis Date  . Essential hypertension, benign   . Mixed hyperlipidemia   . Ventricular ectopy     Bigeminy  . Mitral valve prolapse     Mild mitral regurgitation  . Seizures     Childhood  . Renal cancer     Status post right nephrectomy  . Arthritis   . Chronic back pain   .  Takotsubo cardiomyopathy     February 2013    Social History Mr. Kloster reports that he has never smoked. He has never used smokeless tobacco. Mr. Hyun reports that he does not drink alcohol.  Review of Systems Chronic back pain. Otherwise negative except as outlined.  Physical Examination Filed Vitals:   01/05/14 0815  BP: 162/81  Pulse: 68   Filed Weights   01/05/14 0815  Weight: 219 lb (99.338 kg)   Patient appears comfortable at rest. He stood for the entire encounter due to stated worsening back pain when he sits. HEENT: Conjunctiva and lids normal, oropharynx clear. Neck: Supple, no elevated JVP or carotid bruits, no thyromegaly. Lungs: Clear to auscultation, nonlabored breathing at rest. Cardiac: Regular rate and rhythm, no S3, very soft apical systolic murmur without click, no pericardial rub. Abdomen: Soft, nontender, bowel sounds present. Extremities: No pitting edema, distal pulses 2+. Skin: Warm and dry. Musculoskeletal: No kyphosis. Neuropsychiatric: Alert and oriented x3, affect grossly appropriate.   Problem List and Plan   Takotsubo cardiomyopathy History as outlined above with subsequent improvement in LVEF on medical therapy. He is asymptomatic at this time.  Valvular heart disease Reported history of MVP with mild mitral regurgitation, although not evident by his most  recent echocardiogram in June 2013.  PVCs (premature ventricular contractions) Asymptomatic, on beta blocker.  Essential hypertension, benign Blood pressure elevated this morning. Patient states he had not taken his medicines yet. Keep follow up with Dr. Edrick Oh.  Mixed hyperlipidemia Continues on Lipitor, lipids followed by Dr. Edrick Oh.    Satira Sark, M.D., F.A.C.C.

## 2014-01-05 NOTE — Assessment & Plan Note (Signed)
History as outlined above with subsequent improvement in LVEF on medical therapy. He is asymptomatic at this time.

## 2014-01-05 NOTE — Assessment & Plan Note (Signed)
Reported history of MVP with mild mitral regurgitation, although not evident by his most recent echocardiogram in June 2013.

## 2014-01-05 NOTE — Patient Instructions (Signed)

## 2014-01-05 NOTE — Assessment & Plan Note (Signed)
Asymptomatic, on beta blocker.

## 2014-01-05 NOTE — Assessment & Plan Note (Signed)
Blood pressure elevated this morning. Patient states he had not taken his medicines yet. Keep follow up with Dr. Edrick Oh.

## 2014-01-05 NOTE — Assessment & Plan Note (Signed)
Continues on Lipitor, lipids followed by Dr. Edrick Oh.

## 2014-03-29 DIAGNOSIS — I2510 Atherosclerotic heart disease of native coronary artery without angina pectoris: Secondary | ICD-10-CM | POA: Insufficient documentation

## 2014-12-27 ENCOUNTER — Ambulatory Visit (INDEPENDENT_AMBULATORY_CARE_PROVIDER_SITE_OTHER): Payer: Medicare Other | Admitting: Cardiology

## 2014-12-27 ENCOUNTER — Encounter: Payer: Self-pay | Admitting: Cardiology

## 2014-12-27 VITALS — BP 132/70 | HR 51 | Ht 73.0 in | Wt 217.1 lb

## 2014-12-27 DIAGNOSIS — E782 Mixed hyperlipidemia: Secondary | ICD-10-CM | POA: Diagnosis not present

## 2014-12-27 DIAGNOSIS — I493 Ventricular premature depolarization: Secondary | ICD-10-CM

## 2014-12-27 DIAGNOSIS — I5181 Takotsubo syndrome: Secondary | ICD-10-CM | POA: Diagnosis not present

## 2014-12-27 DIAGNOSIS — I340 Nonrheumatic mitral (valve) insufficiency: Secondary | ICD-10-CM | POA: Diagnosis not present

## 2014-12-27 NOTE — Progress Notes (Signed)
Cardiology Office Note  Date: 12/27/2014   ID: MARQUIZ SOTELO, DOB 09-Feb-1934, MRN [id number]  PCP: Sherrie Mustache, MD  Primary Cardiologist: Rozann Lesches, MD   Chief Complaint  Patient presents with  . History of cardiomyopathy  . PVCs  . Mitral valve disease    History of Present Illness: Darius Baxter is a 81 y.o. male last seen in May 2015. Cardiac history includesTako-tsubo cardiomyopathy in April 2013 the setting of symptomatic cholelithiasis. Followup echocardiogram in June 2013 demonstrated mild LVH with improvement in LVEF the range of 43-32%, grade 1 diastolic dysfunction. At that time he had no evidence of MVP or significant mitral regurgitation.  He is here today for a follow-up visit, standing throughout the entire encounter sighting problems with his back when he sits down. He does not report any progressive shortness of breath. We reviewed his cardiac medications, he states that he has been taking them regularly. He continues to follow with Dr. Edrick Oh, we have requested the most recent ECG for review.  I discussed with him his echocardiogram from 3 years ago, outlined below. He continues to have PVCs, evident on examination today, although does not endorse any palpitations or syncope. He remains functional in ADLs including yard work.   Past Medical History  Diagnosis Date  . Essential hypertension, benign   . Mixed hyperlipidemia   . Ventricular ectopy     Bigeminy  . Mitral valve prolapse     Mild mitral regurgitation  . Seizures     Childhood  . Renal cancer     Status post right nephrectomy  . Arthritis   . Chronic back pain   . Takotsubo cardiomyopathy     February 2013    Past Surgical History  Procedure Laterality Date  . Nephrectomy Right   . Appendectomy    . Knee arthroscopy Right   . Right total knee  2010  . Cataract extraction w/phaco  03/22/2011    Procedure: CATARACT EXTRACTION PHACO AND INTRAOCULAR LENS PLACEMENT (IOC);   Surgeon: Tonny Branch;  Location: [facility];  Service: Ophthalmology;  Laterality: Right;  CDE  19.88  . Cataract extraction w/phaco  04/16/2011    Procedure: CATARACT EXTRACTION PHACO AND INTRAOCULAR LENS PLACEMENT (IOC);  Surgeon: Tonny Branch;  Location: [facility];  Service: Ophthalmology;  Laterality: Left;  CDE 21.13  . Cholecystectomy  01/29/2012    Procedure: LAPAROSCOPIC CHOLECYSTECTOMY WITH INTRAOPERATIVE CHOLANGIOGRAM;  Surgeon: Joyice Faster. Cornett, MD;  Location: [facility];  Service: General;  Laterality: N/A;    Current Outpatient Prescriptions  Medication Sig Dispense Refill  . alfuzosin (UROXATRAL) 10 MG 24 hr tablet Take 1 tablet by mouth every evening.     Marland Kitchen aspirin 81 MG EC tablet Take 81 mg by mouth daily.      Marland Kitchen atorvastatin (LIPITOR) 20 MG tablet Take 10 mg by mouth daily.    . carvedilol (COREG) 6.25 MG tablet Take 6.25 mg by mouth 2 (two) times daily with a meal.    . furosemide (LASIX) 20 MG tablet Take 20 mg by mouth daily.     Marland Kitchen HYDROcodone-acetaminophen (LORTAB) 7.5-500 MG per tablet Take 1 tablet by mouth 2 (two) times a week. Wed, and sun mornings     . lisinopril (PRINIVIL,ZESTRIL) 40 MG tablet Take 40 mg by mouth every evening.     No current facility-administered medications for this visit.    Allergies:  Aleve   Social History: The patient  reports that he has never smoked.  He has never used smokeless tobacco. He reports that he does not drink alcohol or use illicit drugs.   ROS:  Please see the history of present illness. Otherwise, complete review of systems is positive for chronic back pain.  All other systems are reviewed and negative.   Physical Exam: VS:  BP 132/70 mmHg  Pulse 51  Ht 6\' 1"  (1.854 m)  Wt 217 lb 1.9 oz (98.485 kg)  BMI 28.65 kg/m2  SpO2 95%, BMI Body mass index is 28.65 kg/(m^2).  Wt Readings from Last 3 Encounters:  12/27/14 217 lb 1.9 oz (98.485 kg)  01/05/14 219 lb (99.338 kg)  12/31/12 215 lb (97.523 kg)     Patient appears comfortable at  rest. HEENT: Conjunctiva and lids normal, oropharynx clear. Neck: Supple, no elevated JVP or carotid bruits, no thyromegaly. Lungs: Clear to auscultation, nonlabored breathing at rest. Cardiac: Regular rate and rhythm with ectopic beats, no S3, very soft apical systolic murmur without click, no pericardial rub. Abdomen: Soft, nontender, bowel sounds present. Extremities: No pitting edema, distal pulses 2+. Skin: Warm and dry. Musculoskeletal: No kyphosis. Neuropsychiatric: Alert and oriented x3, affect grossly appropriate.   ECG: ECG is not ordered today.   Recent Labwork:   Other Studies Reviewed Today:  Echocardiogram 12/20/2011: Study Conclusions  - Left ventricle: The cavity size was normal. Wall thickness was increased in a pattern of mild LVH. There was mild concentric hypertrophy. Systolic function was normal. The estimated ejection fraction was in the range of 50% to 55%. Wall motion was normal; there were no regional wall motion abnormalities. Doppler parameters are consistent with abnormal left ventricular relaxation (grade 1 diastolic dysfunction). There was no evidence of elevated ventricular filling pressure by Doppler parameters. - Aortic valve: Valve area: 4.27cm^2(VTI). Valve area: 4.3cm^2 (Vmax). - Aorta: The aorta was moderately dilated.   Assessment and Plan:  1. History of Tako-tsubo cardiomyopathy in 2013 as outlined above. He continues on Coreg, lisinopril, and Lasix. With continued ventricular ectopy, a follow-up echocardiogram will be obtained to reassess cardiac structure and function. He does not endorse any major changes in symptoms.  2. Ventricular ectopy, continue beta blocker for now.  3. Hyperlipidemia, on Lipitor, followed by Dr. Edrick Oh.  4. History of mitral regurgitation, improved by his last echocardiogram in 2013.  Current medicines were reviewed with the patient today.   Orders Placed This Encounter  Procedures    . Echocardiogram    Disposition: FU with me in 1 year.   Signed, Satira Sark, MD, [facility] 12/27/2014 9:54 AM    [facility] [address], [place], [place] [address] Phone: [phone]; Fax: [phone]

## 2014-12-27 NOTE — Patient Instructions (Signed)
Your physician recommends that you continue on your current medications as directed. Please refer to the Current Medication list given to you today. Your physician has requested that you have an echocardiogram. Echocardiography is a painless test that uses sound waves to create images of your heart. It provides your doctor with information about the size and shape of your heart and how well your heart's chambers and valves are working. This procedure takes approximately one hour. There are no restrictions for this procedure. Your physician recommends that you schedule a follow-up appointment in: 1 year. You will receive a reminder letter in the mail in about 10 months reminding you to call and schedule your appointment. If you don't receive this letter, please contact our office. 

## 2014-12-28 ENCOUNTER — Other Ambulatory Visit: Payer: Self-pay | Admitting: Cardiology

## 2014-12-28 DIAGNOSIS — I5181 Takotsubo syndrome: Secondary | ICD-10-CM

## 2015-01-13 ENCOUNTER — Ambulatory Visit (INDEPENDENT_AMBULATORY_CARE_PROVIDER_SITE_OTHER): Payer: Medicare Other

## 2015-01-13 ENCOUNTER — Telehealth: Payer: Self-pay | Admitting: *Deleted

## 2015-01-13 ENCOUNTER — Other Ambulatory Visit: Payer: Self-pay

## 2015-01-13 DIAGNOSIS — I5181 Takotsubo syndrome: Secondary | ICD-10-CM

## 2015-01-13 NOTE — Telephone Encounter (Signed)
-----   Message from Satira Sark, MD sent at 01/13/2015  1:26 PM EDT ----- Reviewed report. LVEF remains in the normal range, and there were no major progressive valvular abnormalities. Would continue medical therapy for now.

## 2015-01-13 NOTE — Telephone Encounter (Signed)
Patient informed. 

## 2015-12-27 ENCOUNTER — Ambulatory Visit (INDEPENDENT_AMBULATORY_CARE_PROVIDER_SITE_OTHER): Payer: Medicare Other | Admitting: Cardiology

## 2015-12-27 ENCOUNTER — Encounter: Payer: Self-pay | Admitting: Cardiology

## 2015-12-27 VITALS — BP 130/78 | HR 67 | Ht 73.0 in | Wt 211.0 lb

## 2015-12-27 DIAGNOSIS — Z8679 Personal history of other diseases of the circulatory system: Secondary | ICD-10-CM

## 2015-12-27 DIAGNOSIS — I10 Essential (primary) hypertension: Secondary | ICD-10-CM | POA: Diagnosis not present

## 2015-12-27 DIAGNOSIS — I493 Ventricular premature depolarization: Secondary | ICD-10-CM | POA: Diagnosis not present

## 2015-12-27 DIAGNOSIS — E782 Mixed hyperlipidemia: Secondary | ICD-10-CM

## 2015-12-27 NOTE — Progress Notes (Signed)
Cardiology Office Note  Date: 12/27/2015   ID: Darius Baxter, DOB 1933-09-23, MRN [id number]  PCP: Sherrie Mustache, MD  Primary Cardiologist: Rozann Lesches, MD   Chief Complaint  Patient presents with  . History of cardiomyopathy    History of Present Illness: Darius Baxter is a 81 y.o. male last seen in May 2016. He presents for a routine follow-up visit. He has had stable NYHA class II dyspnea with no significant palpitations or syncope. Main limitation is chronic back pain.  Follow-up echocardiogram from last year showed normal LVEF. I reviewed the results with him again today. Follow-up ECG shows sinus rhythm with PVC.  I reviewed his medications which are outlined below in stable from a cardiac perspective. He continues on aspirin, Coreg, Lipitor, Lasix, and lisinopril.  I reviewed his lab work from Dr. Edrick Oh.  Past Medical History  Diagnosis Date  . Essential hypertension, benign   . Mixed hyperlipidemia   . Ventricular ectopy     Bigeminy  . Mitral valve prolapse     Mild mitral regurgitation  . Seizures ([place])     Childhood  . Renal cancer [facility])     Status post right nephrectomy  . Arthritis   . Chronic back pain   . Takotsubo cardiomyopathy     February 2013    Past Surgical History  Procedure Laterality Date  . Nephrectomy Right   . Appendectomy    . Knee arthroscopy Right   . Right total knee  2010  . Cataract extraction w/phaco  03/22/2011    Procedure: CATARACT EXTRACTION PHACO AND INTRAOCULAR LENS PLACEMENT (IOC);  Surgeon: Tonny Branch;  Location: [facility];  Service: Ophthalmology;  Laterality: Right;  CDE  19.88  . Cataract extraction w/phaco  04/16/2011    Procedure: CATARACT EXTRACTION PHACO AND INTRAOCULAR LENS PLACEMENT (IOC);  Surgeon: Tonny Branch;  Location: [facility];  Service: Ophthalmology;  Laterality: Left;  CDE 21.13  . Cholecystectomy  01/29/2012    Procedure: LAPAROSCOPIC CHOLECYSTECTOMY WITH INTRAOPERATIVE CHOLANGIOGRAM;  Surgeon:  Joyice Faster. Cornett, MD;  Location: [facility];  Service: General;  Laterality: N/A;    Current Outpatient Prescriptions  Medication Sig Dispense Refill  . alfuzosin (UROXATRAL) 10 MG 24 hr tablet Take 1 tablet by mouth daily with breakfast.     . aspirin 81 MG EC tablet Take 81 mg by mouth daily.      Marland Kitchen atorvastatin (LIPITOR) 20 MG tablet Take 10 mg by mouth daily.    . carvedilol (COREG) 6.25 MG tablet Take 6.25 mg by mouth 2 (two) times daily with a meal.    . cholecalciferol (VITAMIN D) 1000 units tablet Take 1,000 Units by mouth daily.    . furosemide (LASIX) 20 MG tablet Take 20 mg by mouth daily.     Marland Kitchen HYDROcodone-acetaminophen (LORTAB) 7.5-500 MG per tablet Take 1 tablet by mouth 2 (two) times a week. Wed, and sun mornings     . lisinopril (PRINIVIL,ZESTRIL) 40 MG tablet Take 40 mg by mouth every evening.     No current facility-administered medications for this visit.   Allergies:  Aleve   Social History: The patient  reports that he has never smoked. He has never used smokeless tobacco. He reports that he does not drink alcohol or use illicit drugs.   ROS:  Please see the history of present illness. Otherwise, complete review of systems is positive for none.  All other systems are reviewed and negative.   Physical Exam: VS:  BP 130/78 mmHg  Pulse 67  Ht 6\' 1"  (1.854 m)  Wt 211 lb (95.709 kg)  BMI 27.84 kg/m2  SpO2 93%, BMI Body mass index is 27.84 kg/(m^2).  Wt Readings from Last 3 Encounters:  12/27/15 211 lb (95.709 kg)  12/27/14 217 lb 1.9 oz (98.485 kg)  01/05/14 219 lb (99.338 kg)    Patient appears comfortable at rest. HEENT: Conjunctiva and lids normal, oropharynx clear. Neck: Supple, no elevated JVP or carotid bruits, no thyromegaly. Lungs: Clear to auscultation, nonlabored breathing at rest. Cardiac: Regular rate and rhythm, no S3, very soft apical systolic murmur without click, no pericardial rub. Abdomen: Soft, nontender, bowel sounds present. Extremities: No  pitting edema, distal pulses 2+. Skin: Warm and dry. Musculoskeletal: No kyphosis. Neuropsychiatric: Alert and oriented x3, affect grossly appropriate.  ECG: I personally reviewed the prior tracing from 01/05/2014 which showed sinus rhythm with PVCs.  Recent Labwork:  April 2016: Cholesterol 134, triglycerides 190, HDL 43, LDL 53, BUN 17, creatinine 1.2, potassium 4.6, AST 11, ALT 9, hemoglobin 14.9, platelets 133  Other Studies Reviewed Today:  Echocardiogram 01/13/2015: Study Conclusions  - Left ventricle: The cavity size was normal. Wall thickness was  normal. Systolic function was normal. The estimated ejection  fraction was in the range of 60% to 65%. Wall motion was normal;  there were no regional wall motion abnormalities. The study is  not technically sufficient to allow evaluation of LV diastolic  function. Frequent ecopty limits the evaluation of diastolic  function. - Aortic valve: Mildly calcified annulus. Trileaflet; mildly  thickened leaflets. - Mitral valve: Mildly calcified annulus. Mildly thickened leaflets. - Left atrium: The atrium was mildly to moderately dilated. - Atrial septum: No defect or patent foramen ovale was identified. - Technically adequate study.  Assessment and Plan:  1. History of Takotsubo cardiomyopathy in 2013, normalization of LVEF subsequently on medical therapy. I reviewed his echocardiogram from last year. He remains symptomatically stable, no changes in medical therapy.  2. Essential hypertension, blood pressure is adequately controlled today.  3. Previously documented PVCs, asymptomatic in terms of palpitations, no syncope. Continues on beta blocker.  4. Hyperlipidemia, on Lipitor. Last LDL was 53.  Current medicines were reviewed with the patient today.   Orders Placed This Encounter  Procedures  . EKG 12-Lead    Disposition: FU with me in 1 year.   Signed, Satira Sark, MD, [facility] 12/27/2015 11:01 AM    [facility] [place], [place], [place] [address] Phone: [phone]; Fax: [phone]

## 2015-12-27 NOTE — Patient Instructions (Signed)

## 2016-02-10 ENCOUNTER — Encounter: Payer: Self-pay | Admitting: Surgery

## 2016-02-15 ENCOUNTER — Other Ambulatory Visit: Payer: Self-pay

## 2016-02-15 DIAGNOSIS — I6523 Occlusion and stenosis of bilateral carotid arteries: Secondary | ICD-10-CM

## 2016-02-17 ENCOUNTER — Encounter: Payer: Self-pay | Admitting: Surgery

## 2016-02-17 ENCOUNTER — Other Ambulatory Visit: Payer: Self-pay

## 2016-02-17 ENCOUNTER — Ambulatory Visit (HOSPITAL_COMMUNITY)
Admission: RE | Admit: 2016-02-17 | Discharge: 2016-02-17 | Disposition: A | Discharge status: Home / Self Care | Payer: Medicare Other | Source: Ambulatory Visit | Attending: Surgery | Admitting: Surgery

## 2016-02-17 ENCOUNTER — Ambulatory Visit (INDEPENDENT_AMBULATORY_CARE_PROVIDER_SITE_OTHER): Payer: Medicare Other | Admitting: Surgery

## 2016-02-17 VITALS — BP 150/83 | HR 55 | Temp 97.6°F | Resp 18 | Ht 73.0 in | Wt 215.0 lb

## 2016-02-17 DIAGNOSIS — I6523 Occlusion and stenosis of bilateral carotid arteries: Secondary | ICD-10-CM

## 2016-02-17 DIAGNOSIS — E785 Hyperlipidemia, unspecified: Secondary | ICD-10-CM | POA: Diagnosis not present

## 2016-02-17 DIAGNOSIS — I10 Essential (primary) hypertension: Secondary | ICD-10-CM | POA: Insufficient documentation

## 2016-02-17 NOTE — Progress Notes (Signed)
Filed Vitals:   02/17/16 1059 02/17/16 1103 02/17/16 1104  BP: 177/89 173/94 150/83  Pulse: 59 55 55  Temp: 97.6 F (36.4 C)    Resp: 18    Height: 6\' 1"  (1.854 m)    Weight: 215 lb (97.523 kg)    SpO2: 96%

## 2016-02-17 NOTE — Progress Notes (Signed)
[facility]  Patient name: Darius Baxter MRN: [id number] DOB: 1934/04/18 Sex: male  REFERRING PHYSICIAN: Dr. Edrick Oh  REASON FOR CONSULT: Carotid stenosis  HPI: Darius Baxter is a 82 y.o. male, who is referred for evaluation of bilateral carotid stenosis, left greater than right.  The patient was recently found to have bilateral carotid bruits.  This led to an ultrasound which showed greater than 70% bilateral stenosis.  On further questioning the patient does describe symptoms of amaurosis fugax.  He has not had an episode in 2 weeks but has had several prior to that.  He describes black streaks going across his eyes and being unable to read from his left eye.  He denies any symptoms in his arms or legs.    The patient is medically managed for hypercholesterolemia.  His Lipitor was recently increased.  He is also on a aspirin which was recently increased from 81-3 25 mg.  He is medically managed for hypertension.  He is status post right nephrectomy secondary to renal carcinoma.  He has a history  cardiomyopathy.  He is followed by Dr. Domenic Polite saw him several months ago.  His symptoms were stable.  He does have history of MI.  He is a nonsmoker  Past Medical History  Diagnosis Date  . Essential hypertension, benign   . Mixed hyperlipidemia   . Ventricular ectopy     Bigeminy  . Mitral valve prolapse     Mild mitral regurgitation  . Seizures ([place])     Childhood  . Renal cancer [facility])     Status post right nephrectomy  . Arthritis   . Chronic back pain   . Takotsubo cardiomyopathy     February 2013  . Carotid artery occlusion     Family History  Problem Relation Age of Onset  . Cancer Other   . Stroke Other   . Coronary artery disease Other   . Hypertension Other   . Anesthesia problems Neg Hx   . Hypotension Neg Hx   . Malignant hyperthermia Neg Hx   . Pseudochol deficiency Neg Hx     SOCIAL  HISTORY: Social History   Social History  . Marital Status: Married    Spouse Name: N/A  . Number of Children: N/A  . Years of Education: N/A   Occupational History  . Not on file.   Social History Main Topics  . Smoking status: Never Smoker   . Smokeless tobacco: Never Used  . Alcohol Use: No  . Drug Use: No  . Sexual Activity: Yes    Birth Control/ Protection: None   Other Topics Concern  . Not on file   Social History Narrative   Does not regularly exercise.     Allergies  Allergen Reactions  . Aleve [Naproxen Sodium]     Patient has only 1 kidney and Doctor  Doesn't want him to take    Current Outpatient Prescriptions  Medication Sig Dispense Refill  . alfuzosin (UROXATRAL) 10 MG 24 hr tablet Take 1 tablet by mouth daily with breakfast.     . aspirin 81 MG EC tablet Take 81 mg by mouth daily.      Marland Kitchen atorvastatin (LIPITOR) 20 MG tablet Take 10 mg by mouth daily.    . carvedilol (COREG) 6.25 MG tablet Take 6.25 mg by mouth 2 (two) times daily with a meal.    . cholecalciferol (VITAMIN D) 1000 units tablet Take 1,000 Units by mouth daily.    Marland Kitchen  furosemide (LASIX) 20 MG tablet Take 20 mg by mouth daily.     Marland Kitchen HYDROcodone-acetaminophen (LORTAB) 7.5-500 MG per tablet Take 1 tablet by mouth 2 (two) times a week. Wed, and sun mornings     . lisinopril (PRINIVIL,ZESTRIL) 40 MG tablet Take 40 mg by mouth every evening.     No current facility-administered medications for this visit.    REVIEW OF SYSTEMS:  [X]  denotes positive finding, [ ]  denotes negative finding Cardiac  Comments:  Chest pain or chest pressure:    Shortness of breath upon exertion:    Short of breath when lying flat:    Irregular heart rhythm: x       Vascular    Pain in calf, thigh, or hip brought on by ambulation:    Pain in feet at night that wakes you up from your sleep:     Blood clot in your veins:    Leg swelling:         Pulmonary    Oxygen at home:    Productive cough:     Wheezing:          Neurologic    Sudden weakness in arms or legs:     Sudden numbness in arms or legs:     Sudden onset of difficulty speaking or slurred speech:    Temporary loss of vision in one eye:  x   Problems with dizziness:  x       Gastrointestinal    Blood in stool:     Vomited blood:         Genitourinary    Burning when urinating:     Blood in urine:        Psychiatric    Major depression:         Hematologic    Bleeding problems: x   Problems with blood clotting too easily:        Skin    Rashes or ulcers:        Constitutional    Fever or chills:      PHYSICAL EXAM: Filed Vitals:   02/17/16 1059 02/17/16 1103 02/17/16 1104  BP: 177/89 173/94 150/83  Pulse: 59 55 55  Temp: 97.6 F (36.4 C)    Resp: 18    Height: 6\' 1"  (1.854 m)    Weight: 215 lb (97.523 kg)    SpO2: 96%      GENERAL: The patient is a well-nourished male, in no acute distress. The vital signs are documented above. CARDIAC: There is a regular rate and rhythm.  VASCULAR: Bilateral carotid bruit PULMONARY: There is good air exchange bilaterally without wheezing or rales. ABDOMEN: Soft and non-tender with normal pitched bowel sounds.  MUSCULOSKELETAL: There are no major deformities or cyanosis. NEUROLOGIC: No focal weakness or paresthesias are detected. SKIN: There are no ulcers or rashes noted. PSYCHIATRIC: The patient has a normal affect.  DATA:  I have reviewed the ultrasound studies from outside institution.  This was also repeated here.  He has 80-99 percent left carotid stenosis and 60-79% right carotid stenosis.  The ICA is normal past the stenosis which is in the mid hyoid location.  MEDICAL ISSUES: Symptomatic left carotid stenosis.  I believe the patient's visual symptoms are consistent with amaurosis, related to his left carotid stenosis.  He needs to undergo urgent left carotid endarterectomy.  We discussed the risks of the procedure which include the risk of stroke, nerve injury, and  bleeding as well as anesthetic  complications and cardia Palmeri complications.  He understands this and wishes to proceed.  Because of his cardiac issues, I will ask Dr. Domenic Polite to stratify his preoperative cardiac risk, understanding that this needs to be done on a relatively urgent basis.  I have him scheduled for Thursday, July 6.   [name], MD [facility]ascular and Vein Specialists of [facility] [phone] Pager [phone]

## 2016-02-22 ENCOUNTER — Encounter (HOSPITAL_COMMUNITY): Payer: Self-pay

## 2016-02-22 ENCOUNTER — Encounter (HOSPITAL_COMMUNITY)
Admission: RE | Admit: 2016-02-22 | Discharge: 2016-02-22 | Disposition: A | Discharge status: Home / Self Care | Payer: Medicare Other | Source: Ambulatory Visit | Attending: Surgery | Admitting: Surgery

## 2016-02-22 ENCOUNTER — Telehealth: Payer: Self-pay | Admitting: *Deleted

## 2016-02-22 HISTORY — DX: Atherosclerotic heart disease of native coronary artery without angina pectoris: I25.10

## 2016-02-22 HISTORY — DX: Acute myocardial infarction, unspecified: I21.9

## 2016-02-22 HISTORY — DX: Cardiac arrhythmia, unspecified: I49.9

## 2016-02-22 LAB — COMPREHENSIVE METABOLIC PANEL
ALT: 15 U/L — ABNORMAL LOW (ref 17–63)
AST: 18 U/L (ref 15–41)
Albumin: 3.9 g/dL (ref 3.5–5.0)
Alkaline Phosphatase: 64 U/L (ref 38–126)
Anion gap: 7 (ref 5–15)
BILIRUBIN TOTAL: 0.6 mg/dL (ref 0.3–1.2)
BUN: 18 mg/dL (ref 6–20)
CHLORIDE: 106 mmol/L (ref 101–111)
CO2: 26 mmol/L (ref 22–32)
CREATININE: 1.14 mg/dL (ref 0.61–1.24)
Calcium: 9.5 mg/dL (ref 8.9–10.3)
GFR, EST NON AFRICAN AMERICAN: 59 mL/min — AB (ref >60)
Glucose, Bld: 118 mg/dL — ABNORMAL HIGH (ref 65–99)
POTASSIUM: 4.3 mmol/L (ref 3.5–5.1)
Sodium: 139 mmol/L (ref 135–145)
TOTAL PROTEIN: 6.5 g/dL (ref 6.5–8.1)

## 2016-02-22 LAB — URINALYSIS, ROUTINE W REFLEX MICROSCOPIC
Bilirubin Urine: NEGATIVE
GLUCOSE, UA: NEGATIVE mg/dL
HGB URINE DIPSTICK: NEGATIVE
Ketones, ur: NEGATIVE mg/dL
LEUKOCYTES UA: NEGATIVE
Nitrite: NEGATIVE
PH: 5.5 (ref 5.0–8.0)
Protein, ur: NEGATIVE mg/dL
Specific Gravity, Urine: 1.024 (ref 1.005–1.030)

## 2016-02-22 LAB — CBC
HEMATOCRIT: 41.7 % (ref 39.0–52.0)
Hemoglobin: 13.4 g/dL (ref 13.0–17.0)
MCH: 30 pg (ref 26.0–34.0)
MCHC: 32.1 g/dL (ref 30.0–36.0)
MCV: 93.3 fL (ref 78.0–100.0)
PLATELETS: 126 10*3/uL — AB (ref 150–400)
RBC: 4.47 MIL/uL (ref 4.22–5.81)
RDW: 13.6 % (ref 11.5–15.5)
WBC: 5.4 10*3/uL (ref 4.0–10.5)

## 2016-02-22 LAB — TYPE AND SCREEN
ABO/RH(D): A POS
ANTIBODY SCREEN: NEGATIVE

## 2016-02-22 LAB — PROTIME-INR
INR: 1.04 (ref 0.00–1.49)
PROTHROMBIN TIME: 13.8 s (ref 11.6–15.2)

## 2016-02-22 LAB — ABO/RH: ABO/RH(D): A POS

## 2016-02-22 LAB — SURGICAL PCR SCREEN
MRSA, PCR: NEGATIVE
Staphylococcus aureus: NEGATIVE

## 2016-02-22 LAB — APTT: aPTT: 31 seconds (ref 24–37)

## 2016-02-22 MED ORDER — DEXTROSE 5 % IV SOLN
1.5000 g | INTRAVENOUS | Status: AC
  Administered 2016-02-23: 1.5 g via INTRAVENOUS
  Filled 2016-02-22: qty 1.5

## 2016-02-22 NOTE — Telephone Encounter (Signed)
Left detailed message for Colletta [place] at [facility]

## 2016-02-22 NOTE — Telephone Encounter (Signed)
Can proceed without further cardiac testing.

## 2016-02-22 NOTE — Telephone Encounter (Signed)
Pt is scheduled for L carotid endarterectomy tomorrow and requesting cardiac clearance. Will forward to DOD with Dr. Domenic Polite out of office today

## 2016-02-22 NOTE — Pre-Procedure Instructions (Addendum)
Miles Neupert Artola  02/22/2016      [facility] [place] [address] [place] [address] Phone: [phone] Fax: [phone]    Your procedure is scheduled on 02/23/16  Report to [facility] Admitting at 1030 A.M.  Call this number if you have problems the morning of surgery:  [phone]   Remember:  Do not eat food or drink liquids after midnight.  Take these medicines the morning of surgery with A SIP OF WATER UROXITRAL,CARVEDILOL(COREG),HYDROCODONE IF NEEDED  STOP all herbel meds, nsaids (aleve,naproxen,advil,ibuprofen) TODAY INCLUDING VITAMINS   Do not wear jewelry, make-up or nail polish.  Do not wear lotions, powders, or perfumes.  You may wear deoderant.  Do not shave 48 hours prior to surgery.  Men may shave face and neck.  Do not bring valuables to the hospital.  [facility] is not responsible for any belongings or valuables.  Contacts, dentures or bridgework may not be worn into surgery.  Leave your suitcase in the car.  After surgery it may be brought to your room.  For patients admitted to the hospital, discharge time will be determined by your treatment team.  Patients discharged the day of surgery will not be allowed to drive home.   Name and phone number of your driver:    Special instructions:   Special Instructions: [facility] - Preparing for Surgery  Before surgery, you can play an important role.  Because skin is not sterile, your skin needs to be as free of germs as possible.  You can reduce the number of germs on you skin by washing with CHG (chlorahexidine gluconate) soap before surgery.  CHG is an antiseptic cleaner which kills germs and bonds with the skin to continue killing germs even after washing.  Please DO NOT use if you have an allergy to CHG or antibacterial soaps.  If your skin becomes reddened/irritated stop using the CHG and inform your nurse when you arrive at Short Stay.  Do not  shave (including legs and underarms) for at least 48 hours prior to the first CHG shower.  You may shave your face.  Please follow these instructions carefully:   1.  Shower with CHG Soap the night before surgery and the morning of Surgery.  2.  If you choose to wash your hair, wash your hair first as usual with your normal shampoo.  3.  After you shampoo, rinse your hair and body thoroughly to remove the Shampoo.  4.  Use CHG as you would any other liquid soap.  You can apply chg directly  to the skin and wash gently with scrungie or a clean washcloth.  5.  Apply the CHG Soap to your body ONLY FROM THE NECK DOWN.  Do not use on open wounds or open sores.  Avoid contact with your eyes ears, mouth and genitals (private parts).  Wash genitals (private parts)       with your normal soap.  6.  Wash thoroughly, paying special attention to the area where your surgery will be performed.  7.  Thoroughly rinse your body with warm water from the neck down.  8.  DO NOT shower/wash with your normal soap after using and rinsing off the CHG Soap.  9.  Pat yourself dry with a clean towel.            10.  Wear clean pajamas.  11.  Place clean sheets on your bed the night of your first shower and do not sleep with pets.  Day of Surgery  Do not apply any lotions/deodorants the morning of surgery.  Please wear clean clothes to the hospital/surgery center.  Please read over the following fact sheets that you were given. Pain Booklet

## 2016-02-23 ENCOUNTER — Encounter (HOSPITAL_COMMUNITY)
Admission: RE | Disposition: A | Discharge status: Home / Self Care | Payer: Self-pay | Source: Ambulatory Visit | Attending: Surgery

## 2016-02-23 ENCOUNTER — Inpatient Hospital Stay (HOSPITAL_COMMUNITY)
Admission: RE | Admit: 2016-02-23 | Discharge: 2016-02-24 | DRG: 038 | Disposition: A | Discharge status: Home / Self Care | Payer: Medicare Other | Source: Ambulatory Visit | Attending: Surgery | Admitting: Surgery

## 2016-02-23 ENCOUNTER — Inpatient Hospital Stay (HOSPITAL_COMMUNITY): Payer: Medicare Other | Admitting: Emergency Medicine

## 2016-02-23 ENCOUNTER — Encounter (HOSPITAL_COMMUNITY): Payer: Self-pay | Admitting: *Deleted

## 2016-02-23 ENCOUNTER — Inpatient Hospital Stay (HOSPITAL_COMMUNITY): Payer: Medicare Other | Admitting: Anesthesiology

## 2016-02-23 DIAGNOSIS — E782 Mixed hyperlipidemia: Secondary | ICD-10-CM | POA: Diagnosis present

## 2016-02-23 DIAGNOSIS — I6522 Occlusion and stenosis of left carotid artery: Principal | ICD-10-CM | POA: Diagnosis present

## 2016-02-23 DIAGNOSIS — Z85528 Personal history of other malignant neoplasm of kidney: Secondary | ICD-10-CM

## 2016-02-23 DIAGNOSIS — I10 Essential (primary) hypertension: Secondary | ICD-10-CM | POA: Diagnosis present

## 2016-02-23 DIAGNOSIS — J029 Acute pharyngitis, unspecified: Secondary | ICD-10-CM | POA: Diagnosis not present

## 2016-02-23 DIAGNOSIS — I6529 Occlusion and stenosis of unspecified carotid artery: Secondary | ICD-10-CM | POA: Diagnosis present

## 2016-02-23 DIAGNOSIS — I252 Old myocardial infarction: Secondary | ICD-10-CM | POA: Diagnosis not present

## 2016-02-23 DIAGNOSIS — Z823 Family history of stroke: Secondary | ICD-10-CM | POA: Diagnosis not present

## 2016-02-23 DIAGNOSIS — Z886 Allergy status to analgesic agent status: Secondary | ICD-10-CM | POA: Diagnosis not present

## 2016-02-23 DIAGNOSIS — Z7982 Long term (current) use of aspirin: Secondary | ICD-10-CM

## 2016-02-23 DIAGNOSIS — Z905 Acquired absence of kidney: Secondary | ICD-10-CM

## 2016-02-23 DIAGNOSIS — Z79899 Other long term (current) drug therapy: Secondary | ICD-10-CM

## 2016-02-23 DIAGNOSIS — I429 Cardiomyopathy, unspecified: Secondary | ICD-10-CM | POA: Diagnosis present

## 2016-02-23 HISTORY — DX: Other chronic pain: G89.29

## 2016-02-23 HISTORY — DX: Low back pain: M54.5

## 2016-02-23 HISTORY — PX: CAROTID ENDARTERECTOMY: SUR193

## 2016-02-23 HISTORY — PX: ENDARTERECTOMY: SHX5162

## 2016-02-23 HISTORY — DX: Personal history of other medical treatment: Z92.89

## 2016-02-23 HISTORY — DX: Low back pain, unspecified: M54.50

## 2016-02-23 LAB — CBC
HCT: 36.9 % — ABNORMAL LOW (ref 39.0–52.0)
HEMOGLOBIN: 11.9 g/dL — AB (ref 13.0–17.0)
MCH: 29.7 pg (ref 26.0–34.0)
MCHC: 32.2 g/dL (ref 30.0–36.0)
MCV: 92 fL (ref 78.0–100.0)
Platelets: 93 10*3/uL — ABNORMAL LOW (ref 150–400)
RBC: 4.01 MIL/uL — ABNORMAL LOW (ref 4.22–5.81)
RDW: 13.5 % (ref 11.5–15.5)
WBC: 4.7 10*3/uL (ref 4.0–10.5)

## 2016-02-23 LAB — CREATININE, SERUM
CREATININE: 0.95 mg/dL (ref 0.61–1.24)
GFR calc Af Amer: >60 mL/min (ref >60)

## 2016-02-23 SURGERY — ENDARTERECTOMY, CAROTID
Anesthesia: General | Site: Neck | Laterality: Left

## 2016-02-23 MED ORDER — ACETAMINOPHEN 325 MG RE SUPP: 325.0000 mg | RECTAL | Status: DC | PRN

## 2016-02-23 MED ORDER — PROMETHAZINE HCL 25 MG/ML IJ SOLN: 6.2500 mg | INTRAMUSCULAR | Status: DC | PRN

## 2016-02-23 MED ORDER — ALFUZOSIN HCL ER 10 MG PO TB24
10.0000 mg | ORAL_TABLET | Freq: Every day | ORAL | Status: DC
  Administered 2016-02-24: 10 mg via ORAL
  Filled 2016-02-23: qty 1

## 2016-02-23 MED ORDER — HYDROMORPHONE HCL 1 MG/ML IJ SOLN
0.5000 mg | INTRAMUSCULAR | Status: DC | PRN
  Administered 2016-02-23 – 2016-02-24 (×3): 1 mg via INTRAVENOUS
  Filled 2016-02-23 (×3): qty 1

## 2016-02-23 MED ORDER — FENTANYL CITRATE (PF) 100 MCG/2ML IJ SOLN
INTRAMUSCULAR | Status: AC
  Filled 2016-02-23: qty 2

## 2016-02-23 MED ORDER — HEMOSTATIC AGENTS (NO CHARGE) OPTIME
TOPICAL | Status: DC | PRN
  Administered 2016-02-23: 1 via TOPICAL

## 2016-02-23 MED ORDER — DEXTROSE 5 % IV SOLN
1.5000 g | Freq: Two times a day (BID) | INTRAVENOUS | Status: AC
  Administered 2016-02-23 – 2016-02-24 (×2): 1.5 g via INTRAVENOUS
  Filled 2016-02-23 (×2): qty 1.5

## 2016-02-23 MED ORDER — SODIUM CHLORIDE 0.9 % IV SOLN: 500.0000 mL | Freq: Once | INTRAVENOUS | Status: DC | PRN

## 2016-02-23 MED ORDER — ENOXAPARIN SODIUM 40 MG/0.4ML ~~LOC~~ SOLN: 40.0000 mg | SUBCUTANEOUS | Status: DC

## 2016-02-23 MED ORDER — CHLORHEXIDINE GLUCONATE CLOTH 2 % EX PADS: 6.0000 | MEDICATED_PAD | Freq: Once | CUTANEOUS | Status: DC

## 2016-02-23 MED ORDER — SODIUM CHLORIDE 0.9 % IV SOLN
INTRAVENOUS | Status: DC | PRN
  Administered 2016-02-23: 500 mL

## 2016-02-23 MED ORDER — FENTANYL CITRATE (PF) 250 MCG/5ML IJ SOLN
INTRAMUSCULAR | Status: AC
  Filled 2016-02-23: qty 5

## 2016-02-23 MED ORDER — METOPROLOL TARTRATE 5 MG/5ML IV SOLN
2.0000 mg | INTRAVENOUS | Status: DC | PRN
Start: 2016-02-23 — End: 2016-02-24

## 2016-02-23 MED ORDER — SODIUM CHLORIDE 0.9 % IV SOLN: INTRAVENOUS | Status: DC

## 2016-02-23 MED ORDER — HEPARIN SODIUM (PORCINE) 1000 UNIT/ML IJ SOLN
INTRAMUSCULAR | Status: AC
  Filled 2016-02-23: qty 2

## 2016-02-23 MED ORDER — ATORVASTATIN CALCIUM 40 MG PO TABS
40.0000 mg | ORAL_TABLET | Freq: Every day | ORAL | Status: DC
  Administered 2016-02-23: 40 mg via ORAL
  Filled 2016-02-23: qty 1

## 2016-02-23 MED ORDER — PROPOFOL 10 MG/ML IV BOLUS
INTRAVENOUS | Status: AC
  Filled 2016-02-23: qty 20

## 2016-02-23 MED ORDER — DOCUSATE SODIUM 100 MG PO CAPS
100.0000 mg | ORAL_CAPSULE | Freq: Every day | ORAL | Status: DC
  Administered 2016-02-24: 100 mg via ORAL
  Filled 2016-02-23: qty 1

## 2016-02-23 MED ORDER — LIDOCAINE HCL 4 % EX SOLN
CUTANEOUS | Status: DC | PRN
  Administered 2016-02-23: 4 mL via TOPICAL

## 2016-02-23 MED ORDER — HYDRALAZINE HCL 20 MG/ML IJ SOLN
5.0000 mg | INTRAMUSCULAR | Status: DC | PRN
  Administered 2016-02-23: 5 mg via INTRAVENOUS

## 2016-02-23 MED ORDER — PHENOL 1.4 % MT LIQD: 1.0000 | OROMUCOSAL | Status: DC | PRN

## 2016-02-23 MED ORDER — HEPARIN SODIUM (PORCINE) 1000 UNIT/ML IJ SOLN
INTRAMUSCULAR | Status: DC | PRN
  Administered 2016-02-23: 10000 [IU] via INTRAVENOUS

## 2016-02-23 MED ORDER — CARVEDILOL 6.25 MG PO TABS
6.2500 mg | ORAL_TABLET | Freq: Two times a day (BID) | ORAL | Status: DC
  Administered 2016-02-23 – 2016-02-24 (×2): 6.25 mg via ORAL
  Filled 2016-02-23 (×2): qty 1

## 2016-02-23 MED ORDER — HYDRALAZINE HCL 20 MG/ML IJ SOLN
INTRAMUSCULAR | Status: AC
  Filled 2016-02-23: qty 1

## 2016-02-23 MED ORDER — PROPOFOL 10 MG/ML IV BOLUS
INTRAVENOUS | Status: DC | PRN
  Administered 2016-02-23: 120 mg via INTRAVENOUS

## 2016-02-23 MED ORDER — SUGAMMADEX SODIUM 200 MG/2ML IV SOLN
INTRAVENOUS | Status: DC | PRN
  Administered 2016-02-23: 200 mg via INTRAVENOUS

## 2016-02-23 MED ORDER — 0.9 % SODIUM CHLORIDE (POUR BTL) OPTIME
TOPICAL | Status: DC | PRN
  Administered 2016-02-23: 1000 mL

## 2016-02-23 MED ORDER — PROTAMINE SULFATE 10 MG/ML IV SOLN
INTRAVENOUS | Status: AC
  Filled 2016-02-23: qty 10

## 2016-02-23 MED ORDER — SODIUM CHLORIDE 0.9 % IV SOLN
INTRAVENOUS | Status: DC
  Administered 2016-02-23: 16:00:00 via INTRAVENOUS

## 2016-02-23 MED ORDER — LIDOCAINE 2% (20 MG/ML) 5 ML SYRINGE
INTRAMUSCULAR | Status: AC
  Filled 2016-02-23: qty 5

## 2016-02-23 MED ORDER — ONDANSETRON HCL 4 MG/2ML IJ SOLN
INTRAMUSCULAR | Status: AC
  Filled 2016-02-23: qty 2

## 2016-02-23 MED ORDER — POTASSIUM CHLORIDE CRYS ER 20 MEQ PO TBCR: 20.0000 meq | EXTENDED_RELEASE_TABLET | Freq: Every day | ORAL | Status: DC | PRN

## 2016-02-23 MED ORDER — HYDROCODONE-ACETAMINOPHEN 5-325 MG PO TABS
1.0000 | ORAL_TABLET | ORAL | Status: DC | PRN
  Administered 2016-02-23 – 2016-02-24 (×4): 2 via ORAL
  Filled 2016-02-23 (×4): qty 2

## 2016-02-23 MED ORDER — LACTATED RINGERS IV SOLN
INTRAVENOUS | Status: DC
  Administered 2016-02-23 (×2): via INTRAVENOUS

## 2016-02-23 MED ORDER — PHENYLEPHRINE 40 MCG/ML (10ML) SYRINGE FOR IV PUSH (FOR BLOOD PRESSURE SUPPORT)
PREFILLED_SYRINGE | INTRAVENOUS | Status: AC
  Filled 2016-02-23: qty 10

## 2016-02-23 MED ORDER — PROTAMINE SULFATE 10 MG/ML IV SOLN
INTRAVENOUS | Status: DC | PRN
  Administered 2016-02-23: 20 mg via INTRAVENOUS
  Administered 2016-02-23: 10 mg via INTRAVENOUS
  Administered 2016-02-23: 20 mg via INTRAVENOUS

## 2016-02-23 MED ORDER — FUROSEMIDE 20 MG PO TABS
20.0000 mg | ORAL_TABLET | Freq: Every day | ORAL | Status: DC
  Administered 2016-02-23 – 2016-02-24 (×2): 20 mg via ORAL
  Filled 2016-02-23 (×2): qty 1

## 2016-02-23 MED ORDER — SENNOSIDES-DOCUSATE SODIUM 8.6-50 MG PO TABS: 1.0000 | ORAL_TABLET | Freq: Every evening | ORAL | Status: DC | PRN

## 2016-02-23 MED ORDER — PANTOPRAZOLE SODIUM 40 MG PO TBEC
40.0000 mg | DELAYED_RELEASE_TABLET | Freq: Every day | ORAL | Status: DC
Start: 2016-02-23 — End: 2016-02-24
  Administered 2016-02-23 – 2016-02-24 (×2): 40 mg via ORAL
  Filled 2016-02-23: qty 1

## 2016-02-23 MED ORDER — LIDOCAINE HCL (PF) 1 % IJ SOLN
INTRAMUSCULAR | Status: AC
  Filled 2016-02-23: qty 30

## 2016-02-23 MED ORDER — ACETAMINOPHEN 325 MG PO TABS: 325.0000 mg | ORAL_TABLET | ORAL | Status: DC | PRN

## 2016-02-23 MED ORDER — SUGAMMADEX SODIUM 200 MG/2ML IV SOLN
INTRAVENOUS | Status: AC
  Filled 2016-02-23: qty 2

## 2016-02-23 MED ORDER — FENTANYL CITRATE (PF) 100 MCG/2ML IJ SOLN
INTRAMUSCULAR | Status: DC | PRN
  Administered 2016-02-23 (×2): 50 ug via INTRAVENOUS

## 2016-02-23 MED ORDER — ROCURONIUM BROMIDE 50 MG/5ML IV SOLN
INTRAVENOUS | Status: AC
  Filled 2016-02-23: qty 1

## 2016-02-23 MED ORDER — FENTANYL CITRATE (PF) 100 MCG/2ML IJ SOLN
25.0000 ug | INTRAMUSCULAR | Status: DC | PRN
  Administered 2016-02-23 (×2): 25 ug via INTRAVENOUS

## 2016-02-23 MED ORDER — GUAIFENESIN-DM 100-10 MG/5ML PO SYRP: 15.0000 mL | ORAL_SOLUTION | ORAL | Status: DC | PRN

## 2016-02-23 MED ORDER — ONDANSETRON HCL 4 MG/2ML IJ SOLN
4.0000 mg | Freq: Four times a day (QID) | INTRAMUSCULAR | Status: DC | PRN
  Administered 2016-02-24: 4 mg via INTRAVENOUS
  Filled 2016-02-23: qty 2

## 2016-02-23 MED ORDER — ROCURONIUM BROMIDE 100 MG/10ML IV SOLN
INTRAVENOUS | Status: DC | PRN
  Administered 2016-02-23: 10 mg via INTRAVENOUS
  Administered 2016-02-23: 40 mg via INTRAVENOUS

## 2016-02-23 MED ORDER — LISINOPRIL 40 MG PO TABS
40.0000 mg | ORAL_TABLET | Freq: Every evening | ORAL | Status: DC
  Administered 2016-02-23: 40 mg via ORAL
  Filled 2016-02-23: qty 1

## 2016-02-23 MED ORDER — GLYCOPYRROLATE 0.2 MG/ML IJ SOLN
INTRAMUSCULAR | Status: DC | PRN
  Administered 2016-02-23: .3 mg via INTRAVENOUS

## 2016-02-23 MED ORDER — ASPIRIN EC 325 MG PO TBEC
325.0000 mg | DELAYED_RELEASE_TABLET | Freq: Every day | ORAL | Status: DC
  Administered 2016-02-24: 325 mg via ORAL
  Filled 2016-02-23: qty 1

## 2016-02-23 MED ORDER — LABETALOL HCL 5 MG/ML IV SOLN: 10.0000 mg | INTRAVENOUS | Status: DC | PRN

## 2016-02-23 MED ORDER — ONDANSETRON HCL 4 MG/2ML IJ SOLN
INTRAMUSCULAR | Status: DC | PRN
  Administered 2016-02-23: 4 mg via INTRAVENOUS

## 2016-02-23 MED ORDER — ALUM & MAG HYDROXIDE-SIMETH 200-200-20 MG/5ML PO SUSP: 15.0000 mL | ORAL | Status: DC | PRN

## 2016-02-23 MED ORDER — MAGNESIUM SULFATE 2 GM/50ML IV SOLN: 2.0000 g | Freq: Every day | INTRAVENOUS | Status: DC | PRN

## 2016-02-23 MED ORDER — MIDAZOLAM HCL 2 MG/2ML IJ SOLN
INTRAMUSCULAR | Status: AC
  Filled 2016-02-23: qty 2

## 2016-02-23 MED ORDER — LACTATED RINGERS IV SOLN: INTRAVENOUS | Status: DC

## 2016-02-23 MED ORDER — MEPERIDINE HCL 25 MG/ML IJ SOLN: 6.2500 mg | INTRAMUSCULAR | Status: DC | PRN

## 2016-02-23 MED ORDER — PHENYLEPHRINE HCL 10 MG/ML IJ SOLN
10.0000 mg | INTRAVENOUS | Status: DC | PRN
  Administered 2016-02-23: 30 ug/min via INTRAVENOUS

## 2016-02-23 MED ORDER — SODIUM CHLORIDE 0.9 % IV SOLN
0.0125 ug/kg/min | INTRAVENOUS | Status: AC
  Administered 2016-02-23: .2 ug/kg/min via INTRAVENOUS
  Filled 2016-02-23: qty 2000

## 2016-02-23 SURGICAL SUPPLY — 51 items
CANISTER SUCTION 2500CC (MISCELLANEOUS) ×3 IMPLANT
CATH ROBINSON RED A/P 18FR (CATHETERS) ×3 IMPLANT
CATH SUCT 10FR WHISTLE TIP (CATHETERS) ×3 IMPLANT
CLIP TI MEDIUM 6 (CLIP) ×3 IMPLANT
CLIP TI WIDE RED SMALL 6 (CLIP) ×3 IMPLANT
CRADLE DONUT ADULT HEAD (MISCELLANEOUS) ×3 IMPLANT
DRAIN CHANNEL 15F RND FF W/TCR (WOUND CARE) IMPLANT
ELECT CAUTERY BLADE 6.4 (BLADE) ×3 IMPLANT
ELECT REM PT RETURN 9FT ADLT (ELECTROSURGICAL) ×3
ELECTRODE REM PT RTRN 9FT ADLT (ELECTROSURGICAL) ×1 IMPLANT
EVACUATOR SILICONE 100CC (DRAIN) IMPLANT
GAUZE SPONGE 4X4 12PLY STRL (GAUZE/BANDAGES/DRESSINGS) ×3 IMPLANT
GLOVE BIO SURGEON STRL SZ7.5 (GLOVE) ×3 IMPLANT
GLOVE BIOGEL PI IND STRL 7.0 (GLOVE) ×2 IMPLANT
GLOVE BIOGEL PI IND STRL 7.5 (GLOVE) ×3 IMPLANT
GLOVE BIOGEL PI IND STRL 8 (GLOVE) ×1 IMPLANT
GLOVE BIOGEL PI INDICATOR 7.0 (GLOVE) ×4
GLOVE BIOGEL PI INDICATOR 7.5 (GLOVE) ×6
GLOVE BIOGEL PI INDICATOR 8 (GLOVE) ×2
GLOVE ECLIPSE 6.5 STRL STRAW (GLOVE) ×6 IMPLANT
GLOVE ECLIPSE 7.0 STRL STRAW (GLOVE) ×6 IMPLANT
GLOVE SURG SS PI 7.5 STRL IVOR (GLOVE) ×3 IMPLANT
GOWN STRL REUS W/ TWL LRG LVL3 (GOWN DISPOSABLE) ×2 IMPLANT
GOWN STRL REUS W/ TWL XL LVL3 (GOWN DISPOSABLE) ×1 IMPLANT
GOWN STRL REUS W/TWL LRG LVL3 (GOWN DISPOSABLE) ×4
GOWN STRL REUS W/TWL XL LVL3 (GOWN DISPOSABLE) ×2
HEMOSTAT SNOW SURGICEL 2X4 (HEMOSTASIS) ×3 IMPLANT
INSERT FOGARTY SM (MISCELLANEOUS) IMPLANT
KIT BASIN OR (CUSTOM PROCEDURE TRAY) ×3 IMPLANT
KIT ROOM TURNOVER OR (KITS) ×3 IMPLANT
LIQUID BAND (GAUZE/BANDAGES/DRESSINGS) ×3 IMPLANT
NEEDLE HYPO 25GX1X1/2 BEV (NEEDLE) IMPLANT
NS IRRIG 1000ML POUR BTL (IV SOLUTION) ×6 IMPLANT
PACK CAROTID (CUSTOM PROCEDURE TRAY) ×3 IMPLANT
PAD ARMBOARD 7.5X6 YLW CONV (MISCELLANEOUS) ×6 IMPLANT
PATCH VASC XENOSURE 1CMX6CM (Vascular Products) ×2 IMPLANT
PATCH VASC XENOSURE 1X6 (Vascular Products) ×1 IMPLANT
PENCIL BUTTON HOLSTER BLD 10FT (ELECTRODE) ×3 IMPLANT
SHUNT CAROTID BYPASS 10 (VASCULAR PRODUCTS) IMPLANT
SHUNT CAROTID BYPASS 12FRX15.5 (VASCULAR PRODUCTS) IMPLANT
SPONGE INTESTINAL PEANUT (DISPOSABLE) IMPLANT
SUT ETHILON 3 0 PS 1 (SUTURE) IMPLANT
SUT PROLENE 6 0 BV (SUTURE) ×6 IMPLANT
SUT PROLENE 7 0 BV 1 (SUTURE) IMPLANT
SUT SILK 3 0 TIES 17X18 (SUTURE)
SUT SILK 3-0 18XBRD TIE BLK (SUTURE) IMPLANT
SUT VIC AB 3-0 SH 27 (SUTURE) ×4
SUT VIC AB 3-0 SH 27X BRD (SUTURE) ×2 IMPLANT
SUT VICRYL 4-0 PS2 18IN ABS (SUTURE) ×6 IMPLANT
SYR CONTROL 10ML LL (SYRINGE) IMPLANT
WATER STERILE IRR 1000ML POUR (IV SOLUTION) ×3 IMPLANT

## 2016-02-23 NOTE — Interval H&P Note (Signed)
History and Physical Interval Note:  02/23/2016 12:05 PM  Darius Baxter  has presented today for surgery, with the diagnosis of Left carotid artery stenosis I65.22  The various methods of treatment have been discussed with the patient and family. After consideration of risks, benefits and other options for treatment, the patient has consented to  Procedure(s): ENDARTERECTOMY CAROTID (Left) as a surgical intervention .  The patient's history has been reviewed, patient examined, no change in status, stable for surgery.  I have reviewed the patient's chart and labs.  Questions were answered to the patient's satisfaction.     [name]

## 2016-02-23 NOTE — Anesthesia Postprocedure Evaluation (Signed)
Anesthesia Post Note  Patient: Dane Mihalick Skirvin  Procedure(s) Performed: Procedure(s) (LRB): LEFT CAROTID ENDARTERECTOMY WITH XENOSURE BOVINE PERICARDIUM PATCH ANGIOPLASTY (Left)  Patient location during evaluation: PACU Anesthesia Type: General Level of consciousness: awake and alert Pain management: pain level controlled Vital Signs Assessment: post-procedure vital signs reviewed and stable Respiratory status: spontaneous breathing, nonlabored ventilation, respiratory function stable and patient connected to nasal cannula oxygen Cardiovascular status: blood pressure returned to baseline and stable Postop Assessment: no signs of nausea or vomiting Anesthetic complications: no    Last Vitals:  Filed Vitals:   02/23/16 1540 02/23/16 1549  BP: 127/69   Pulse: 56   Temp:  36.4 C  Resp: 10     Last Pain:  Filed Vitals:   02/23/16 1550  PainSc: [place]

## 2016-02-23 NOTE — Anesthesia Preprocedure Evaluation (Addendum)
Anesthesia Evaluation  Patient identified by MRN, date of birth, ID band Patient awake    Reviewed: Allergy & Precautions, NPO status , Patient's Chart, lab work & pertinent test results, reviewed documented beta blocker date and time   Airway Mallampati: I  TM Distance: >3 FB Neck ROM: Full    Dental  (+) Edentulous Upper, Missing, Dental Advisory Given   Pulmonary neg pulmonary ROS,    breath sounds clear to auscultation       Cardiovascular hypertension, Pt. on home beta blockers and Pt. on medications + CAD, + Past MI and + Peripheral Vascular Disease  + dysrhythmias + Valvular Problems/Murmurs MVP  Rhythm:Regular Rate:Normal     Neuro/Psych Seizures -,  negative psych ROS   GI/Hepatic negative GI ROS, Neg liver ROS,   Endo/Other  negative endocrine ROS  Renal/GU Renal disease  negative genitourinary   Musculoskeletal  (+) Arthritis ,   Abdominal   Peds negative pediatric ROS (+)  Hematology negative hematology ROS (+)   Anesthesia Other Findings   Reproductive/Obstetrics negative OB ROS                            Lab Results  Component Value Date   WBC 5.4 02/22/2016   HGB 13.4 02/22/2016   HCT 41.7 02/22/2016   MCV 93.3 02/22/2016   PLT 126* 02/22/2016   Lab Results  Component Value Date   CREATININE 1.14 02/22/2016   BUN 18 02/22/2016   NA 139 02/22/2016   K 4.3 02/22/2016   CL 106 02/22/2016   CO2 26 02/22/2016   Lab Results  Component Value Date   INR 1.04 02/22/2016   INR 1.01 10/06/2011   12/2015 EKG: normal sinus rhythm.  12/2014 Echo - Left ventricle: The cavity size was normal. Wall thickness was normal. Systolic function was normal. The estimated ejection fraction was in the range of 60% to 65%. Wall motion was normal; there were no regional wall motion abnormalities. The study is not technically sufficient to allow evaluation of LV diastolic function.  Frequent ecopty limits the evaluation of diastolic function. - Aortic valve: Mildly calcified annulus. Trileaflet; mildly thickened leaflets. - Mitral valve: Mildly calcified annulus. Mildly thickened leaflets . - Left atrium: The atrium was mildly to moderately dilated. - Atrial septum: No defect or patent foramen ovale was identified.    Anesthesia Physical Anesthesia Plan  ASA: III  Anesthesia Plan: General   Post-op Pain Management:    Induction: Intravenous  Airway Management Planned: Oral ETT  Additional Equipment: Arterial line  Intra-op Plan:   Post-operative Plan: Extubation in OR  Informed Consent: I have reviewed the patients History and Physical, chart, labs and discussed the procedure including the risks, benefits and alternatives for the proposed anesthesia with the patient or authorized representative who has indicated his/her understanding and acceptance.   Dental advisory given  Plan Discussed with: CRNA  Anesthesia Plan Comments: (Remifentanil gtt)        Anesthesia Quick Evaluation

## 2016-02-23 NOTE — Progress Notes (Signed)
       CC: sore throat  Alert and oriented Grip and sensation intact bil. UE Incision without hematoma Smile symmetric, no tongue deviation   S/P left CEA Disposition stable  COLLINS, EMMA MAUREEN PA-C

## 2016-02-23 NOTE — H&P (View-Only) (Signed)
Vascular and Vein Specialist of [facility]  Patient name: Darius Baxter MRN: [id number] DOB: 02/05/1934 Sex: male  REFERRING PHYSICIAN: Dr. Edrick Oh  REASON FOR CONSULT: Carotid stenosis  HPI: Darius Baxter is a 82 y.o. male, who is referred for evaluation of bilateral carotid stenosis, left greater than right.  The patient was recently found to have bilateral carotid bruits.  This led to an ultrasound which showed greater than 70% bilateral stenosis.  On further questioning the patient does describe symptoms of amaurosis fugax.  He has not had an episode in 2 weeks but has had several prior to that.  He describes black streaks going across his eyes and being unable to read from his left eye.  He denies any symptoms in his arms or legs.    The patient is medically managed for hypercholesterolemia.  His Lipitor was recently increased.  He is also on a aspirin which was recently increased from 81-3 25 mg.  He is medically managed for hypertension.  He is status post right nephrectomy secondary to renal carcinoma.  He has a history  cardiomyopathy.  He is followed by Dr. Domenic Polite saw him several months ago.  His symptoms were stable.  He does have history of MI.  He is a nonsmoker  Past Medical History  Diagnosis Date  . Essential hypertension, benign   . Mixed hyperlipidemia   . Ventricular ectopy     Bigeminy  . Mitral valve prolapse     Mild mitral regurgitation  . Seizures ([place])     Childhood  . Renal cancer [facility])     Status post right nephrectomy  . Arthritis   . Chronic back pain   . Takotsubo cardiomyopathy     February 2013  . Carotid artery occlusion     Family History  Problem Relation Age of Onset  . Cancer Other   . Stroke Other   . Coronary artery disease Other   . Hypertension Other   . Anesthesia problems Neg Hx   . Hypotension Neg Hx   . Malignant hyperthermia Neg Hx   . Pseudochol deficiency Neg Hx     SOCIAL  HISTORY: Social History   Social History  . Marital Status: Married    Spouse Name: N/A  . Number of Children: N/A  . Years of Education: N/A   Occupational History  . Not on file.   Social History Main Topics  . Smoking status: Never Smoker   . Smokeless tobacco: Never Used  . Alcohol Use: No  . Drug Use: No  . Sexual Activity: Yes    Birth Control/ Protection: None   Other Topics Concern  . Not on file   Social History Narrative   Does not regularly exercise.     Allergies  Allergen Reactions  . Aleve [Naproxen Sodium]     Patient has only 1 kidney and Doctor  Doesn't want him to take    Current Outpatient Prescriptions  Medication Sig Dispense Refill  . alfuzosin (UROXATRAL) 10 MG 24 hr tablet Take 1 tablet by mouth daily with breakfast.     . aspirin 81 MG EC tablet Take 81 mg by mouth daily.      Marland Kitchen atorvastatin (LIPITOR) 20 MG tablet Take 10 mg by mouth daily.    . carvedilol (COREG) 6.25 MG tablet Take 6.25 mg by mouth 2 (two) times daily with a meal.    . cholecalciferol (VITAMIN D) 1000 units tablet Take 1,000 Units by mouth daily.    Marland Kitchen  furosemide (LASIX) 20 MG tablet Take 20 mg by mouth daily.     Marland Kitchen HYDROcodone-acetaminophen (LORTAB) 7.5-500 MG per tablet Take 1 tablet by mouth 2 (two) times a week. Wed, and sun mornings     . lisinopril (PRINIVIL,ZESTRIL) 40 MG tablet Take 40 mg by mouth every evening.     No current facility-administered medications for this visit.    REVIEW OF SYSTEMS:  [X]  denotes positive finding, [ ]  denotes negative finding Cardiac  Comments:  Chest pain or chest pressure:    Shortness of breath upon exertion:    Short of breath when lying flat:    Irregular heart rhythm: x       Vascular    Pain in calf, thigh, or hip brought on by ambulation:    Pain in feet at night that wakes you up from your sleep:     Blood clot in your veins:    Leg swelling:         Pulmonary    Oxygen at home:    Productive cough:     Wheezing:          Neurologic    Sudden weakness in arms or legs:     Sudden numbness in arms or legs:     Sudden onset of difficulty speaking or slurred speech:    Temporary loss of vision in one eye:  x   Problems with dizziness:  x       Gastrointestinal    Blood in stool:     Vomited blood:         Genitourinary    Burning when urinating:     Blood in urine:        Psychiatric    Major depression:         Hematologic    Bleeding problems: x   Problems with blood clotting too easily:        Skin    Rashes or ulcers:        Constitutional    Fever or chills:      PHYSICAL EXAM: Filed Vitals:   02/17/16 1059 02/17/16 1103 02/17/16 1104  BP: 177/89 173/94 150/83  Pulse: 59 55 55  Temp: 97.6 F (36.4 C)    Resp: 18    Height: 6\' 1"  (1.854 m)    Weight: 215 lb (97.523 kg)    SpO2: 96%      GENERAL: The patient is a well-nourished male, in no acute distress. The vital signs are documented above. CARDIAC: There is a regular rate and rhythm.  VASCULAR: Bilateral carotid bruit PULMONARY: There is good air exchange bilaterally without wheezing or rales. ABDOMEN: Soft and non-tender with normal pitched bowel sounds.  MUSCULOSKELETAL: There are no major deformities or cyanosis. NEUROLOGIC: No focal weakness or paresthesias are detected. SKIN: There are no ulcers or rashes noted. PSYCHIATRIC: The patient has a normal affect.  DATA:  I have reviewed the ultrasound studies from outside institution.  This was also repeated here.  He has 80-99 percent left carotid stenosis and 60-79% right carotid stenosis.  The ICA is normal past the stenosis which is in the mid hyoid location.  MEDICAL ISSUES: Symptomatic left carotid stenosis.  I believe the patient's visual symptoms are consistent with amaurosis, related to his left carotid stenosis.  He needs to undergo urgent left carotid endarterectomy.  We discussed the risks of the procedure which include the risk of stroke, nerve injury, and  bleeding as well as anesthetic  complications and cardia Palmeri complications.  He understands this and wishes to proceed.  Because of his cardiac issues, I will ask Dr. Domenic Polite to stratify his preoperative cardiac risk, understanding that this needs to be done on a relatively urgent basis.  I have him scheduled for Thursday, July 6.   [name], MD [facility] and Vein Specialists of [facility] [phone] Pager [phone]

## 2016-02-23 NOTE — Op Note (Signed)
Patient name: Darius Baxter MRN: [id number] DOB: 22-Dec-1933 Sex: male  02/23/2016 Pre-operative Diagnosis: Symptomatic   left carotid stenosis Post-operative diagnosis:  Same Surgeon:  [name] Assistants:  Gae Gallop Procedure:    left carotid Endarterectomy with bovine pericardial patch angioplasty Anesthesia:  General Blood Loss:  See anesthesia record Specimens:  Carotid Plaque to pathology  Findings:  90 %stenosis; Thrombus:  none  Indications:  83 yo with high grade left  ICA stenosis and amaurosis  Procedure:  The patient was identified in the holding area and taken to [facility]  The patient was then placed supine on the table.   General endotrachial anesthesia was administered.  The patient was prepped and draped in the usual sterile fashion.  A time out was called and antibiotics were administered.  The incision was made along the anterior border of the left sternocleidomastoid muscle.  Cautery was used to dissect through the subcutaneous tissue.  The platysma muscle was divided with cautery.  The internal jugular vein was exposed along its anterior medial border.  The common facial vein was exposed and then divided between 2-0 silk ties and metal clips.  The common carotid artery was then circumferentially exposed and encircled with an umbilical tape.  The vagus nerve was identified and protected.  Next sharp dissection was used to expose the external carotid artery and the superior thyroid artery.  The were encircled with a blue vessel loop and a 2-0 silk tie respectively.  Finally, the internal carotid was carefully dissected free.  An umbilical tape was placed around the internal carotid artery distal to the diseased segment.  The hypoglossal nerve was visualized throughout and protected.  The patient was given systemic heparinization.  A bovine carotid patch was selected and prepared on the back table.  A 10 french shunt was also prepared.  After blood pressure readings  were appropriate and the heparin had been given time to circulate, the internal carotid artery was occluded with a baby Gregory clamp.  The external and common carotid arteries were then occluded with vascular clamps and the 2-0 tie tightened on the superior thyroid artery.  A #11 blade was used to make an arteriotomy in the common carotid artery.  This was extended with Potts scissors along the anterior and lateral border of the common and internal carotid artery.  Approximately 90% stenosis was identified.  There was no thrombus identified.  The 10 french shunt was then placed.  A kleiner kuntz elevator was used to perform endarterectomy.  An eversion endarterectomy was performed in the external carotid artery.  A good distal endpoint was obtained in the internal carotid artery.  The specimen was removed and sent to pathology.  Heparinized saline was used to irrigate the endarterectomized field.  All potential embolic debris was removed.  Bovine pericardial patch angioplasty was then performed using a running 6-0 Prolene. Just prior to completion of the repair, the shunt was removed. The common internal and external carotid arteries were all appropriately flushed. The artery was again irrigated with heparin saline.  The anastomosis was then secured. The clamp was first released on the external carotid artery followed by the common carotid artery approximately 30 seconds later, bloodflow was reestablish through the internal carotid artery.  Next, a hand-held  Doppler was used to evaluate the signals in the common, external, and internal  carotid arteries, all of which had appropriate signals. I then administered  50 mg protamine. The wound was then irrigated.  After hemostasis was achieved, the carotid sheath was reapproximated with 3-0 Vicryl. The  platysma muscle was reapproximated with running 3-0 Vicryl. The skin  was closed with 4-0 Vicryl. Dermabond was placed on the skin. The  patient was then  successfully extubated. His neurologic exam was  similar to his preprocedural exam. The patient was then taken to recovery room  in stable condition. There were no complications.     Disposition:  To PACU in stable condition.  Relevant Operative Details:  Low bifurcation.  Shunt utilized.  Ruptured plaque at bifurcation with soft plaque  V. [name], M.D. [facility] [place] Office: [phone] Pager:  [phone]

## 2016-02-23 NOTE — Transfer of Care (Signed)
Immediate Anesthesia Transfer of Care Note  Patient: Darius Baxter  Procedure(s) Performed: Procedure(s): LEFT CAROTID ENDARTERECTOMY WITH XENOSURE BOVINE PERICARDIUM PATCH ANGIOPLASTY (Left)  Patient Location: PACU  Anesthesia Type:General  Level of Consciousness: awake, alert  and oriented  Airway & Oxygen Therapy: Patient Spontanous Breathing and Patient connected to nasal cannula oxygen  Post-op Assessment: Report given to RN, Post -op Vital signs reviewed and stable and Patient moving all extremities X 4  Post vital signs: Reviewed and stable  Last Vitals:  Filed Vitals:   02/23/16 1037  BP: 190/78  Pulse: 61  Temp: 36.7 C  Resp: 20    Last Pain:  Filed Vitals:   02/23/16 1038  PainSc: 7       Patients Stated Pain Goal: 5 ([id number])  Complications: No apparent anesthesia complications

## 2016-02-23 NOTE — Anesthesia Procedure Notes (Signed)
Procedure Name: Intubation Date/Time: 02/23/2016 12:28 PM Performed by: Mariea Clonts Pre-anesthesia Checklist: Emergency Drugs available, Patient identified, Suction available, Patient being monitored and Timeout performed Patient Re-evaluated:Patient Re-evaluated prior to inductionOxygen Delivery Method: Circle system utilized Preoxygenation: Pre-oxygenation with 100% oxygen Intubation Type: IV induction Ventilation: Mask ventilation without difficulty and Oral airway inserted - appropriate to patient size Laryngoscope Size: Sabra Heck and 2 Grade View: Grade I Tube type: Oral Tube size: 7.5 mm Number of attempts: 1 Airway Equipment and Method: LTA kit utilized Placement Confirmation: ETT inserted through vocal cords under direct vision,  positive ETCO2 and breath sounds checked- equal and bilateral Tube secured with: Tape Dental Injury: Teeth and Oropharynx as per pre-operative assessment

## 2016-02-24 ENCOUNTER — Telehealth: Payer: Self-pay | Admitting: Surgery

## 2016-02-24 ENCOUNTER — Encounter (HOSPITAL_COMMUNITY): Payer: Self-pay | Admitting: Surgery

## 2016-02-24 LAB — CBC
HEMATOCRIT: 39.2 % (ref 39.0–52.0)
Hemoglobin: 13.1 g/dL (ref 13.0–17.0)
MCH: 30.6 pg (ref 26.0–34.0)
MCHC: 33.4 g/dL (ref 30.0–36.0)
MCV: 91.6 fL (ref 78.0–100.0)
PLATELETS: 109 10*3/uL — AB (ref 150–400)
RBC: 4.28 MIL/uL (ref 4.22–5.81)
RDW: 13.6 % (ref 11.5–15.5)
WBC: 7.1 10*3/uL (ref 4.0–10.5)

## 2016-02-24 LAB — BASIC METABOLIC PANEL
ANION GAP: 4 — AB (ref 5–15)
BUN: 15 mg/dL (ref 6–20)
CHLORIDE: 110 mmol/L (ref 101–111)
CO2: 23 mmol/L (ref 22–32)
Calcium: 8.6 mg/dL — ABNORMAL LOW (ref 8.9–10.3)
Creatinine, Ser: 0.9 mg/dL (ref 0.61–1.24)
Glucose, Bld: 113 mg/dL — ABNORMAL HIGH (ref 65–99)
POTASSIUM: 4.4 mmol/L (ref 3.5–5.1)
SODIUM: 137 mmol/L (ref 135–145)

## 2016-02-24 NOTE — Progress Notes (Signed)
[facility]  Subjective  - He is ready to go home.     Objective 122/54 83 98 F (36.7 C) (Oral) 19 97%  Intake/Output Summary (Last 24 hours) at 02/24/16 0726 Last data filed at 02/24/16 0436  Gross per 24 hour  Intake   2725 ml  Output   1075 ml  Net   1650 ml    No tongue deviation, smile is symmetric Grip 5/5 Palpable radial pulse Incision soft without hematoma   Assessment/Planning: POD # 1 left CEA  Stable disposition for discharge  Laurence Slate [facility] 02/24/2016 7:26 AM --  Laboratory Lab Results:  Recent Labs  02/23/16 1538 02/24/16 0505  WBC 4.7 7.1  HGB 11.9* 13.1  HCT 36.9* 39.2  PLT 93* 109*   BMET  Recent Labs  02/22/16 1517 02/23/16 1538 02/24/16 0505  NA 139  --  137  K 4.3  --  4.4  CL 106  --  110  CO2 26  --  23  GLUCOSE 118*  --  113*  BUN 18  --  15  CREATININE 1.14 0.95 0.90  CALCIUM 9.5  --  8.6*    COAG Lab Results  Component Value Date   INR 1.04 02/22/2016   INR 1.01 10/06/2011   No results found for: PTT

## 2016-02-24 NOTE — Care Management Important Message (Signed)
Important Message  Patient Details  Name: Darius Baxter MRN: [id number] Date of Birth: 1933/11/20   Medicare Important Message Given:  Yes    Loann Quill 02/24/2016, 10:33 AM

## 2016-02-24 NOTE — Telephone Encounter (Signed)
Sched appt 7/19 at 10:15. Lm on hm#.

## 2016-02-24 NOTE — Telephone Encounter (Signed)
-----   Message from Mena Goes, RN sent at 02/24/2016  9:22 AM EDT ----- Regarding: 2 weeks postop   ----- Message -----    From: Dario Ave    Sent: 02/24/2016   7:54 AM      To: [facility] Charge Pool Subject: Kay's log                                        ----- Message -----    From: Ulyses Amor, PA-C    Sent: 02/24/2016   7:45 AM      To: [facility]vs Charge Pool  F/U with Dr. Trula Slade in 2 weeks left CEA

## 2016-02-28 NOTE — Discharge Summary (Signed)
[facility] Discharge Summary   Patient ID:  Darius Baxter MRN: [id number] DOB/AGE: 05/02/1934 82 y.o.  Admit date: 02/23/2016 Discharge date: 02/24/2016 Date of Surgery: 02/23/2016 Surgeon: Surgeon(s): Serafina Mitchell, MD Angelia Mould, MD  Admission Diagnosis: Left carotid artery stenosis I65.22  Discharge Diagnoses:  Left carotid artery stenosis I65.22  Secondary Diagnoses: Past Medical History  Diagnosis Date  . Essential hypertension, benign   . Mixed hyperlipidemia   . Ventricular ectopy     Bigeminy  . Mitral valve prolapse     Mild mitral regurgitation  . Chronic lower back pain   . Takotsubo cardiomyopathy     February 2013  . Carotid artery occlusion   . Dysrhythmia     irregular skip occ per pt  . Coronary artery disease   . Myocardial infarction ([place]) 2014  . History of blood transfusion 2008; 2010    "when they took out my kidney and when they replaced my right knee"  . Seizures ([place])     Childhood-82 yrs old "passed out"  . Arthritis     "back" (02/23/2016)  . Renal cancer ([place])     Status post right nephrectomy    Procedure(s): LEFT CAROTID ENDARTERECTOMY WITH XENOSURE BOVINE PERICARDIUM PATCH ANGIOPLASTY  Discharged Condition: good  HPI: Darius Baxter is a 82 y.o. male, who is referred for evaluation of bilateral carotid stenosis, left greater than right. The patient was recently found to have bilateral carotid bruits. This led to an ultrasound which showed greater than 70% bilateral stenosis. On further questioning the patient does describe symptoms of amaurosis fugax. He has not had an episode in 2 weeks but has had several prior to that. He describes black streaks going across his eyes and being unable to read from his left eye. He denies any symptoms in his arms or legs.   The patient is medically managed for hypercholesterolemia. His Lipitor was recently increased. He is also on a aspirin which was recently  increased from 81-3 25 mg. He is medically managed for hypertension. He is status post right nephrectomy secondary to renal carcinoma. He has a history cardiomyopathy. He is followed by Dr. Domenic Polite saw him several months ago. His symptoms were stable. He does have history of MI. He is a nonsmoker  DATA:  I have reviewed the ultrasound studies from outside institution. This was also repeated here. He has 80-99 percent left carotid stenosis and 60-79% right carotid stenosis. The ICA is normal past the stenosis which is in the mid hyoid location.   Hospital Course:  Darius Baxter is a 82 y.o. male is S/P  Procedure(s): LEFT CAROTID ENDARTERECTOMY WITH XENOSURE BOVINE PERICARDIUM PATCH ANGIOPLASTY    No tongue deviation, smile is symmetric Grip 5/5 Palpable radial pulse Incision soft without hematoma   Assessment/Planning: POD # 1 left CEA  Stable disposition for discharge F/U in 2 weeks with Dr. Trula Slade  Significant Diagnostic Studies: CBC Lab Results  Component Value Date   WBC 7.1 02/24/2016   HGB 13.1 02/24/2016   HCT 39.2 02/24/2016   MCV 91.6 02/24/2016   PLT 109* 02/24/2016    BMET    Component Value Date/Time   NA 137 02/24/2016 0505   K 4.4 02/24/2016 0505   CL 110 02/24/2016 0505   CO2 23 02/24/2016 0505   GLUCOSE 113* 02/24/2016 0505   BUN 15 02/24/2016 0505   CREATININE 0.90 02/24/2016 0505   CALCIUM 8.6* 02/24/2016 0505   GFRNONAA >60  02/24/2016 0505   GFRAA >60 02/24/2016 0505   COAG Lab Results  Component Value Date   INR 1.04 02/22/2016   INR 1.01 10/06/2011     Disposition:  Discharge to :Home Discharge Instructions    Call MD for:  redness, tenderness, or signs of infection (pain, swelling, bleeding, redness, odor or green/yellow discharge around incision site)    Complete by:  As directed      Call MD for:  severe or increased pain, loss or decreased feeling  in affected limb(s)    Complete by:  As directed      Call MD  for:  temperature >100.5    Complete by:  As directed      Discharge instructions    Complete by:  As directed   You may shower in 24 hours     Discharge patient    Complete by:  As directed   Discharge pt to home     Driving Restrictions    Complete by:  As directed   No driving for 1 week     Lifting restrictions    Complete by:  As directed   No heavy lifting for 1 week     Resume previous diet    Complete by:  As directed             Medication List    TAKE these medications        alfuzosin 10 MG 24 hr tablet  Commonly known as:  UROXATRAL  Take 10 mg by mouth daily with breakfast.     aspirin 325 MG EC tablet  Take 325 mg by mouth daily.     atorvastatin 40 MG tablet  Commonly known as:  LIPITOR  Take 40 mg by mouth daily.     carvedilol 6.25 MG tablet  Commonly known as:  COREG  Take 6.25 mg by mouth 2 (two) times daily with a meal.     furosemide 20 MG tablet  Commonly known as:  LASIX  Take 20 mg by mouth daily.     HYDROcodone-acetaminophen 7.5-500 MG tablet  Commonly known as:  LORTAB  Take 1 tablet by mouth 2 (two) times a week. Wed, and sun mornings     lisinopril 40 MG tablet  Commonly known as:  PRINIVIL,ZESTRIL  Take 40 mg by mouth every evening.     Vitamin D3 400 units Caps  Take 400 Units by mouth daily.       Verbal and written Discharge instructions given to the patient. Wound care per Discharge AVS     Follow-up Information    Follow up with [name], MD In 2 weeks.   Specialties:  Vascular Surgery, Cardiology   Why:  Office will call you to arrange your appt (sent)   Contact information:   [address] [place] [address] [phone]       Signed: Roxy Horseman 02/28/2016, 1:02 PM  --- For [facility] Registry use --- Instructions: Press F2 to tab through selections.  Delete question if not applicable.   Modified Rankin score at D/C (0-6): Rankin Score=0  IV medication needed for:  1. Hypertension: No 2.  Hypotension: No  Post-op Complications: No  1. Post-op CVA or TIA: No  If yes: Event classification (right eye, left eye, right cortical, left cortical, verterobasilar, other):   If yes: Timing of event (intra-op, <6 hrs post-op, >=6 hrs post-op, unknown):   2. CN injury: No  If yes: CN  injuried  3. Myocardial infarction: No  If yes: Dx by (EKG or clinical, Troponin):   4.  CHF: No  5.  Dysrhythmia (new): No  6. Wound infection: No  7. Reperfusion symptoms: No  8. Return to OR: No  If yes: return to OR for (bleeding, neurologic, other CEA incision, other):   Discharge medications: Statin use:  Yes ASA use:  Yes Beta blocker use:  Yes ACE-Inhibitor use:  Yes P2Y12 Antagonist use: [ x] None, [ ]  Plavix, [ ]  Plasugrel, [ ]  Ticlopinine, [ ]  Ticagrelor, [ ]  Other, [ ]  No for medical reason, [ ]  Non-compliant, [ ]  Not-indicated Anti-coagulant use:  [ x] None, [ ]  Warfarin, [ ]  Rivaroxaban, [ ]  Dabigatran, [ ]  Other, [ ]  No for medical reason, [ ]  Non-compliant, [ ]  Not-indicated

## 2016-03-02 ENCOUNTER — Encounter: Payer: Self-pay | Admitting: Surgery

## 2016-03-07 ENCOUNTER — Encounter: Payer: Self-pay | Admitting: Surgery

## 2016-03-07 ENCOUNTER — Ambulatory Visit (INDEPENDENT_AMBULATORY_CARE_PROVIDER_SITE_OTHER): Payer: Medicare Other | Admitting: Surgery

## 2016-03-07 VITALS — BP 133/83 | HR 59 | Temp 97.4°F | Resp 18 | Ht 73.0 in | Wt 216.1 lb

## 2016-03-07 DIAGNOSIS — I6523 Occlusion and stenosis of bilateral carotid arteries: Secondary | ICD-10-CM

## 2016-03-07 NOTE — Progress Notes (Signed)
   Patient name: Darius Baxter MRN: [id number] DOB: [birth year] Sex: male  REASON FOR VISIT: Post op  HPI: Darius Baxter is a 82 y.o. male who is status post left carotid endarterectomy with bovine pericardial patch angioplasty on 02/23/2016.  This was done for symptomatic left carotid stenosis.  The patient was having amaurosis fugax in the left eye prior to his operation.  Intraoperative findings included a 90% stenosis with a ruptured plaque at the bifurcation with soft plaque.  The patient has not had any further episodes of amaurosis.  He denies any neurologic symptoms.  He is without pain.  Current Outpatient Prescriptions  Medication Sig Dispense Refill  . alfuzosin (UROXATRAL) 10 MG 24 hr tablet Take 10 mg by mouth daily with breakfast.     . aspirin 325 MG EC tablet Take 325 mg by mouth daily.    Marland Kitchen atorvastatin (LIPITOR) 40 MG tablet Take 40 mg by mouth daily.    . carvedilol (COREG) 6.25 MG tablet Take 6.25 mg by mouth 2 (two) times daily with a meal.    . Cholecalciferol (VITAMIN D3) 400 units CAPS Take 1,000 Units by mouth daily.     . furosemide (LASIX) 20 MG tablet Take 20 mg by mouth daily.     Marland Kitchen HYDROcodone-acetaminophen (LORTAB) 7.5-500 MG per tablet Take 1 tablet by mouth 2 (two) times a week. Wed, and sun mornings     . lisinopril (PRINIVIL,ZESTRIL) 40 MG tablet Take 40 mg by mouth every evening.     No current facility-administered medications for this visit.    REVIEW OF SYSTEMS:  [X]  denotes positive finding, [ ]  denotes negative finding Cardiac  Comments:  Chest pain or chest pressure:    Shortness of breath upon exertion:    Short of breath when lying flat:    Irregular heart rhythm:    Constitutional    Fever or chills:      PHYSICAL EXAM: Filed Vitals:   03/07/16 1028 03/07/16 1032  BP: 152/83 133/83  Pulse: 59   Temp: 97.4 F (36.3 C)   TempSrc: Oral   Resp: 18   Height: 6\' 1"  (1.854 m)   Weight: 216 lb 1.6 oz  (98.022 kg)   SpO2: 97%     GENERAL: The patient is a well-nourished male, in no acute distress. The vital signs are documented above. CARDIOVASCULAR: There is a regular rate and rhythm. PULMONARY: There is good air exchange bilaterally without wheezing or rales. Neurologically intact Left carotid incision is healing appropriately   MEDICAL ISSUES:  patient is recovering nicely from his operation.  He denies any neurologic symptoms.  He has not had any further episodes of amaurosis.  He is scheduled for follow-up in 6 months with a repeat carotid duplex.  [name], MD [facility] [phone] Pager [phone]

## 2016-03-07 NOTE — Progress Notes (Signed)
Filed Vitals:   03/07/16 1028 03/07/16 1032  BP: 152/83 133/83  Pulse: 59   Temp: 97.4 F (36.3 C)   Resp: 18

## 2016-07-03 NOTE — Addendum Note (Signed)
Addended by: Mena Goes on: 07/03/2016 03:01 PM   Modules accepted: Orders

## 2016-07-24 DIAGNOSIS — I739 Peripheral vascular disease, unspecified: Secondary | ICD-10-CM | POA: Insufficient documentation

## 2016-07-24 DIAGNOSIS — G8929 Other chronic pain: Secondary | ICD-10-CM | POA: Insufficient documentation

## 2016-07-24 DIAGNOSIS — M549 Dorsalgia, unspecified: Secondary | ICD-10-CM | POA: Insufficient documentation

## 2016-07-24 DIAGNOSIS — D510 Vitamin B12 deficiency anemia due to intrinsic factor deficiency: Secondary | ICD-10-CM | POA: Insufficient documentation

## 2016-08-07 DIAGNOSIS — H6123 Impacted cerumen, bilateral: Secondary | ICD-10-CM | POA: Insufficient documentation

## 2016-09-11 ENCOUNTER — Encounter: Payer: Self-pay | Admitting: Surgery

## 2016-09-13 ENCOUNTER — Ambulatory Visit (HOSPITAL_COMMUNITY)
Admission: RE | Admit: 2016-09-13 | Discharge: 2016-09-13 | Disposition: A | Discharge status: Home / Self Care | Payer: Medicare Other | Source: Ambulatory Visit | Attending: Vascular Surgery | Admitting: Vascular Surgery

## 2016-09-13 DIAGNOSIS — I6521 Occlusion and stenosis of right carotid artery: Secondary | ICD-10-CM | POA: Insufficient documentation

## 2016-09-13 DIAGNOSIS — I6523 Occlusion and stenosis of bilateral carotid arteries: Secondary | ICD-10-CM

## 2016-09-17 ENCOUNTER — Ambulatory Visit (INDEPENDENT_AMBULATORY_CARE_PROVIDER_SITE_OTHER): Payer: Medicare Other | Admitting: Family

## 2016-09-17 ENCOUNTER — Encounter: Payer: Self-pay | Admitting: Family

## 2016-09-17 VITALS — BP 184/100 | HR 74 | Temp 97.8°F | Resp 20 | Ht 73.0 in | Wt 227.7 lb

## 2016-09-17 DIAGNOSIS — Z9889 Other specified postprocedural states: Secondary | ICD-10-CM | POA: Diagnosis not present

## 2016-09-17 DIAGNOSIS — I6523 Occlusion and stenosis of bilateral carotid arteries: Secondary | ICD-10-CM | POA: Diagnosis not present

## 2016-09-17 NOTE — Progress Notes (Signed)
Vitals:   09/17/16 1158 09/17/16 1218 09/17/16 1219  BP: (!) 195/99 (!) 178/98 (!) 184/100  Pulse: 74    Resp: 20    Temp: 97.8 F (36.6 C)    TempSrc: Oral    SpO2: 95%    Weight: 227 lb 11.2 oz (103.3 kg)    Height: 6\' 1"  (1.854 m)

## 2016-09-17 NOTE — Patient Instructions (Signed)
Stroke Prevention Some medical conditions and behaviors are associated with an increased chance of having a stroke. You may prevent a stroke by making healthy choices and managing medical conditions. How can I reduce my risk of having a stroke?  Stay physically active. Get at least 30 minutes of activity on most or all days.  Do not smoke. It may also be helpful to avoid exposure to secondhand smoke.  Limit alcohol use. Moderate alcohol use is considered to be:  No more than 2 drinks per day for men.  No more than 1 drink per day for nonpregnant women.  Eat healthy foods. This involves:  Eating 5 or more servings of fruits and vegetables a day.  Making dietary changes that address high blood pressure (hypertension), high cholesterol, diabetes, or obesity.  Manage your cholesterol levels.  Making food choices that are high in fiber and low in saturated fat, trans fat, and cholesterol may control cholesterol levels.  Take any prescribed medicines to control cholesterol as directed by your health care provider.  Manage your diabetes.  Controlling your carbohydrate and sugar intake is recommended to manage diabetes.  Take any prescribed medicines to control diabetes as directed by your health care provider.  Control your hypertension.  Making food choices that are low in salt (sodium), saturated fat, trans fat, and cholesterol is recommended to manage hypertension.  Ask your health care provider if you need treatment to lower your blood pressure. Take any prescribed medicines to control hypertension as directed by your health care provider.  If you are 18-39 years of age, have your blood pressure checked every 3-5 years. If you are 40 years of age or older, have your blood pressure checked every year.  Maintain a healthy weight.  Reducing calorie intake and making food choices that are low in sodium, saturated fat, trans fat, and cholesterol are recommended to manage  weight.  Stop drug abuse.  Avoid taking birth control pills.  Talk to your health care provider about the risks of taking birth control pills if you are over 35 years old, smoke, get migraines, or have ever had a blood clot.  Get evaluated for sleep disorders (sleep apnea).  Talk to your health care provider about getting a sleep evaluation if you snore a lot or have excessive sleepiness.  Take medicines only as directed by your health care provider.  For some people, aspirin or blood thinners (anticoagulants) are helpful in reducing the risk of forming abnormal blood clots that can lead to stroke. If you have the irregular heart rhythm of atrial fibrillation, you should be on a blood thinner unless there is a good reason you cannot take them.  Understand all your medicine instructions.  Make sure that other conditions (such as anemia or atherosclerosis) are addressed. Get help right away if:  You have sudden weakness or numbness of the face, arm, or leg, especially on one side of the body.  Your face or eyelid droops to one side.  You have sudden confusion.  You have trouble speaking (aphasia) or understanding.  You have sudden trouble seeing in one or both eyes.  You have sudden trouble walking.  You have dizziness.  You have a loss of balance or coordination.  You have a sudden, severe headache with no known cause.  You have new chest pain or an irregular heartbeat. Any of these symptoms may represent a serious problem that is an emergency. Do not wait to see if the symptoms will go away.   Get medical help at once. Call your local emergency services (911 in [place] Do not drive yourself to the hospital. This information is not intended to replace advice given to you by your health care provider. Make sure you discuss any questions you have with your health care provider. Document Released: 09/13/2004 Document Revised: 01/12/2016 Document Reviewed: 02/06/2013 Elsevier  Interactive Patient Education  2017 [employer]  

## 2016-09-17 NOTE — Progress Notes (Signed)
Chief Complaint: Follow up Extracranial Carotid Artery Stenosis   History of Present Illness  Darius Baxter is a 83 y.o. male patient of Dr. Trula Slade who is status post left carotid endarterectomy with bovine pericardial patch angioplasty on 02/23/2016.  This was done for symptomatic left carotid stenosis.  The patient was having amaurosis fugax in the left eye prior to his operation.  Intraoperative findings included a 90% stenosis with a ruptured plaque at the bifurcation with soft plaque.  The patient has not had any further episodes of amaurosis.  He denies any neurologic symptoms.  He is without pain.  Pt denies any claudication symptoms with walking.  He does have a hx of bilateral knee replacements and his knees feel improved.   Pt states his cardiologist, Dr. Domenic Polite, is aware of his irregular cardiac rhythm.  Pt Diabetic: no Pt smoker: non-smoker  Pt meds include: Statin : yes ASA: yes Other anticoagulants/antiplatelets: no   Past Medical History:  Diagnosis Date  . Arthritis    "back" (02/23/2016)  . Carotid artery occlusion   . Chronic lower back pain   . Coronary artery disease   . Dysrhythmia    irregular skip occ per pt  . Essential hypertension, benign   . History of blood transfusion 2008; 2010   "when they took out my kidney and when they replaced my right knee"  . Mitral valve prolapse    Mild mitral regurgitation  . Mixed hyperlipidemia   . Myocardial infarction 2014  . Renal cancer [facility])    Status post right nephrectomy  . Seizures ([place])    Childhood-83 yrs old "passed out"  . Takotsubo cardiomyopathy    February 2013  . Ventricular ectopy    Bigeminy    Social History Social History  Substance Use Topics  . Smoking status: Never Smoker  . Smokeless tobacco: Never Used  . Alcohol use No    Family History Family History  Problem Relation Age of Onset  . Cancer Other   . Stroke Other   . Coronary artery disease Other   .  Hypertension Other   . Hypertension Mother   . Peripheral Artery Disease Mother     had leg bypass, and died after surgery; believed to be from throwing a clot  . CVA Father   . Prostate cancer Father   . Anesthesia problems Neg Hx   . Hypotension Neg Hx   . Malignant hyperthermia Neg Hx   . Pseudochol deficiency Neg Hx     Surgical History Past Surgical History:  Procedure Laterality Date  . APPENDECTOMY    . CAROTID ENDARTERECTOMY Left 02/23/2016  . CATARACT EXTRACTION W/PHACO  03/22/2011   Procedure: CATARACT EXTRACTION PHACO AND INTRAOCULAR LENS PLACEMENT (IOC);  Surgeon: Tonny Branch;  [facility]: [facility];  Service: Ophthalmology;  Laterality: Right;  CDE  19.88  . CATARACT EXTRACTION W/PHACO  04/16/2011   Procedure: CATARACT EXTRACTION PHACO AND INTRAOCULAR LENS PLACEMENT (IOC);  Surgeon: Tonny Branch;  Location: [facility];  Service: Ophthalmology;  Laterality: Left;  CDE 21.13  . CHOLECYSTECTOMY  01/29/2012   Procedure: LAPAROSCOPIC CHOLECYSTECTOMY WITH INTRAOPERATIVE CHOLANGIOGRAM;  Surgeon: Joyice Faster. [place], MD;  Location: [place];  Service: General;  Laterality: N/A;  . ENDARTERECTOMY Left 02/23/2016   Procedure: LEFT CAROTID ENDARTERECTOMY WITH XENOSURE BOVINE PERICARDIUM PATCH ANGIOPLASTY;  Surgeon: Serafina Mitchell, MD;  Location: [place];  Service: Vascular;  Laterality: Left;  . JOINT REPLACEMENT    . KNEE ARTHROSCOPY Right   .  LITHOTRIPSY    . NEPHRECTOMY Right 2008  . TOTAL KNEE ARTHROPLASTY  2010  . TOTAL KNEE ARTHROPLASTY Left 2014    Allergies  Allergen Reactions  . Aleve [Naproxen Sodium] Other (See Comments)    Patient has only 1 kidney and Doctor  Doesn't want him to take  . Naproxen Sodium Other (See Comments)    Patient has only 1 kidney and Doctor Doesn't want him to take    Current Outpatient Prescriptions  Medication Sig Dispense Refill  . alfuzosin (UROXATRAL) 10 MG 24 hr tablet Take 10 mg by mouth daily with breakfast.     . aspirin 325 MG EC tablet Take 325 mg by  mouth daily.    Marland Kitchen atorvastatin (LIPITOR) 40 MG tablet Take 40 mg by mouth daily.    . carvedilol (COREG) 6.25 MG tablet Take 6.25 mg by mouth 2 (two) times daily with a meal.    . Cholecalciferol (VITAMIN D3) 400 units CAPS Take 1,000 Units by mouth daily.     . diphenhydramine-acetaminophen (TYLENOL PM) 25-500 MG TABS tablet Take 1 tablet by mouth at bedtime as needed.    . furosemide (LASIX) 20 MG tablet Take 20 mg by mouth daily.     Marland Kitchen HYDROcodone-acetaminophen (LORTAB) 7.5-500 MG per tablet Take 1 tablet by mouth 2 (two) times a week. Wed, and sun mornings     . lisinopril (PRINIVIL,ZESTRIL) 40 MG tablet Take 40 mg by mouth every evening.     No current facility-administered medications for this visit.     Review of Systems : See HPI for pertinent positives and negatives.  Physical Examination  Vitals:   09/17/16 1158 09/17/16 1218 09/17/16 1219  BP: (!) 195/99 (!) 178/98 (!) 184/100  Pulse: 74    Resp: 20    Temp: 97.8 F (36.6 C)    TempSrc: Oral    SpO2: 95%    Weight: 227 lb 11.2 oz (103.3 kg)    Height: 6\' 1"  (1.854 m)     Body mass index is 30.04 kg/m.  General: WDWN male in NAD GAIT: normal Eyes: PERRLA Pulmonary:  Respirations are non-labored, good air movement, CTAB  Cardiac: Irregular rhythm, no detected murmur.  VASCULAR EXAM Carotid Bruits Right Left   Negative Negative    Aorta is not palpable. Radial pulses are 2+ palpable and equal.                                                                                                                            LE Pulses Right Left       POPLITEAL   palpable   not palpable       POSTERIOR TIBIAL   palpable    palpable        DORSALIS PEDIS      ANTERIOR TIBIAL  palpable   palpable     Gastrointestinal: soft, nontender, BS WNL, no r/g,  no palpable masses.  Musculoskeletal: No muscle atrophy/wasting. M/S  5/5 throughout, extremities without ischemic changes.  Neurologic: A&O X 3; Appropriate  Affect, Speech is normal CN 2-12 intact, pain and light touch intact in extremities, Motor exam as listed above.     Assessment: Darius Baxter is a 83 y.o. male who is status post left carotid endarterectomy with bovine pericardial patch angioplasty on 02/23/2016.  This was done for symptomatic left carotid stenosis.  The patient was having amaurosis fugax in the left eye prior to his operation.  He has had no subsequent neurological event.   Pt denies headache, dizziness, chest pain, or dyspnea.   I advised pt to got to his PCP's office now and ask them to check his blood pressure, and advise him if it remains elevated.   DATA Today's carotid duplex suggests 60-79% right ICA stenosis and <40% left ICA (CEA site) stenosis. Bilateral vertebral artery flow is antegrade.  Subclavian artery waveforms: right is multiphasic (normal); left is biphasic. No prior postoperative duplex for comparison.   Plan: Follow-up in 6 months with Carotid Duplex scan.   I discussed in depth with the patient the nature of atherosclerosis, and emphasized the importance of maximal medical management including strict control of blood pressure, blood glucose, and lipid levels, obtaining regular exercise, and continued cessation of smoking.  The patient is aware that without maximal medical management the underlying atherosclerotic disease process will progress, limiting the benefit of any interventions. The patient was given information about stroke prevention and what symptoms should prompt the patient to seek immediate medical care. Thank you for allowing [place] to participate in this patient's care.  Clemon Chambers, RN, MSN, FNP-C [facility] Office: [phone]  Clinic Physician: Trula Slade  09/17/16 12:23 PM

## 2016-09-24 DIAGNOSIS — C649 Malignant neoplasm of unspecified kidney, except renal pelvis: Secondary | ICD-10-CM | POA: Insufficient documentation

## 2017-01-07 ENCOUNTER — Encounter: Payer: Self-pay | Admitting: Cardiology

## 2017-01-07 NOTE — Progress Notes (Signed)
Cardiology Office Note  Date: 01/08/2017   ID: DEMTRIUS ROUNDS, DOB April 24, 1934, MRN [id number]  PCP: Dione Housekeeper, MD  Primary Cardiologist: Rozann Lesches, MD   Chief Complaint  Patient presents with  . History of cardiomyopathy    History of Present Illness: Darius Baxter is an 83 y.o. male last seen in May 2017. He is here today with his wife for a follow-up visit. He states that over the last year he has had no major decline in stamina. He reports no exertional chest pain, NYHA class II with typical activities, no progressive palpitations, and no syncope.  He continues to follow with [facility], history of left CEA. Recent carotid Dopplers are outlined below.  I reviewed his recent lab work. He follows with Dr. Edrick Oh regularly.  I personally reviewed his ECG today which shows sinus rhythm with PVC.  Echocardiogram from 2016 is reviewed below showing normal LVEF.  Past Medical History:  Diagnosis Date  . Arthritis   . Carotid artery occlusion   . Chronic lower back pain   . Coronary artery disease    Mild at cardiac catheterization 2013  . Essential hypertension   . History of blood transfusion 2008; 2010  . Mitral valve prolapse    Mild mitral regurgitation  . Mixed hyperlipidemia   . Renal cancer [facility])    Status post right nephrectomy  . Seizures ([place])    Childhood-83 yrs old "passed out"  . Takotsubo cardiomyopathy    February 2013  . Ventricular ectopy    Bigeminy    Past Surgical History:  Procedure Laterality Date  . APPENDECTOMY    . CAROTID ENDARTERECTOMY Left 02/23/2016  . CATARACT EXTRACTION W/PHACO  03/22/2011   Procedure: CATARACT EXTRACTION PHACO AND INTRAOCULAR LENS PLACEMENT (IOC);  Surgeon: Tonny Branch;  [facility];  Service: Ophthalmology;  Laterality: Right;  CDE  19.88  . CATARACT EXTRACTION W/PHACO  04/16/2011   Procedure: CATARACT EXTRACTION PHACO AND INTRAOCULAR LENS PLACEMENT (IOC);  Surgeon: Tonny Branch;  [facility];  Service:  Ophthalmology;  Laterality: Left;  CDE 21.13  . CHOLECYSTECTOMY  01/29/2012   Procedure: LAPAROSCOPIC CHOLECYSTECTOMY WITH INTRAOPERATIVE CHOLANGIOGRAM;  Surgeon: Joyice Faster. [place], MD;  Location: [place];  Service: General;  Laterality: N/A;  . ENDARTERECTOMY Left 02/23/2016   Procedure: LEFT CAROTID ENDARTERECTOMY WITH XENOSURE BOVINE PERICARDIUM PATCH ANGIOPLASTY;  Surgeon: Serafina Mitchell, MD;  Location: [place];  Service: Vascular;  Laterality: Left;  . JOINT REPLACEMENT    . KNEE ARTHROSCOPY Right   . LITHOTRIPSY    . NEPHRECTOMY Right 2008  . TOTAL KNEE ARTHROPLASTY  2010  . TOTAL KNEE ARTHROPLASTY Left 2014    Current Outpatient Prescriptions  Medication Sig Dispense Refill  . alfuzosin (UROXATRAL) 10 MG 24 hr tablet Take 10 mg by mouth daily with breakfast.     . amLODipine (NORVASC) 5 MG tablet Take 5 mg by mouth daily.    Marland Kitchen aspirin 325 MG EC tablet Take 325 mg by mouth daily.    Marland Kitchen atorvastatin (LIPITOR) 40 MG tablet Take 40 mg by mouth daily.    . carvedilol (COREG) 6.25 MG tablet Take 6.25 mg by mouth 2 (two) times daily with a meal.    . Cholecalciferol (VITAMIN D3) 400 units CAPS Take 1,000 Units by mouth daily.     . diphenhydramine-acetaminophen (TYLENOL PM) 25-500 MG TABS tablet Take 1 tablet by mouth at bedtime as needed.    . furosemide (LASIX) 20 MG tablet Take 20  mg by mouth daily.     Marland Kitchen HYDROcodone-acetaminophen (LORTAB) 7.5-500 MG per tablet Take 1 tablet by mouth 2 (two) times a week. Wed, and sun mornings     . lisinopril (PRINIVIL,ZESTRIL) 40 MG tablet Take 40 mg by mouth every evening.     No current facility-administered medications for this visit.    Allergies:  Aleve [naproxen sodium] and Naproxen sodium   Social History: The patient  reports that he has never smoked. He has never used smokeless tobacco. He reports that he does not drink alcohol or use drugs.   ROS:  Please see the history of present illness. Otherwise, complete review of systems is positive for  none.  All other systems are reviewed and negative.   Physical Exam: VS:  BP (!) 149/68   Pulse 60   Ht 6\' 1"  (1.854 m)   Wt 229 lb 6.4 oz (104.1 kg)   SpO2 97%   BMI 30.27 kg/m , BMI Body mass index is 30.27 kg/m.  Wt Readings from Last 3 Encounters:  01/08/17 229 lb 6.4 oz (104.1 kg)  09/17/16 227 lb 11.2 oz (103.3 kg)  03/07/16 216 lb 1.6 oz (98 kg)    Patient appears comfortable at rest. HEENT: Conjunctiva and lids normal, oropharynx clear. Neck: Supple, no elevated JVP or carotid bruits, no thyromegaly. Lungs: Clear to auscultation, nonlabored breathing at rest. Cardiac: Regular rate and rhythm, no S3, very soft apical systolic murmur without click, no pericardial rub. Abdomen: Soft, nontender, bowel sounds present. Extremities: No pitting edema, distal pulses 2+. Skin: Warm and dry. Musculoskeletal: No kyphosis. Neuropsychiatric: Alert and oriented x3, affect grossly appropriate.  ECG: I personally reviewed the tracing from 12/27/2015 which showed sinus rhythm with PVC and nonspecific ST changes.  Recent Labwork: 02/22/2016: ALT 15; AST 18 02/24/2016: BUN 15; Creatinine, Ser 0.90; Hemoglobin 13.1; Platelets 109; Potassium 4.4; Sodium 137  April 2018: Hemoglobin 14.2, platelets 142, BUN 25, creatinine 1.2, potassium 5.0, AST 12, ALT 11, cholesterol 136, triglycerides 185, HDL 45, LDL 54, TSH 3.95  Other Studies Reviewed Today:  Carotid Dopplers 09/13/2016: 60-79% RICA stenosis, patent left CEA site.  Echocardiogram 01/13/2015: Study Conclusions  - Left ventricle: The cavity size was normal. Wall thickness was   normal. Systolic function was normal. The estimated ejection   fraction was in the range of 60% to 65%. Wall motion was normal;   there were no regional wall motion abnormalities. The study is   not technically sufficient to allow evaluation of LV diastolic   function. Frequent ecopty limits the evaluation of diastolic   function. - Aortic valve: Mildly  calcified annulus. Trileaflet; mildly   thickened leaflets. - Mitral valve: Mildly calcified annulus. Mildly thickened leaflets. - Left atrium: The atrium was mildly to moderately dilated. - Atrial septum: No defect or patent foramen ovale was identified. - Technically adequate study.  Assessment and Plan:  1. History of Tako-tsubo cardiomyopathy with normalization of LVEF, last echocardiogram was in 2016. He is not reporting any significant change in overall status and we will plan to continue with observation for now. Medications include Coreg and lisinopril.  2. Intermittent PVCs, no reported palpitations or history of syncope.  3. Essential hypertension. Systolic blood pressure in the 140s today. Keep follow-up with Dr. Edrick Oh. I did review his recent lab work.  4. Hyperlipidemia, on Lipitor. Recent LDL 54.  Current medicines were reviewed with the patient today.   Orders Placed This Encounter  Procedures  . EKG 12-Lead  Disposition: Follow-up in one year.   Signed, Satira Sark, MD, [facility] 01/08/2017 10:17 AM    [facility] [place], [place], [place] [address] Phone: [phone]; Fax: [phone]

## 2017-01-08 ENCOUNTER — Ambulatory Visit (INDEPENDENT_AMBULATORY_CARE_PROVIDER_SITE_OTHER): Payer: Medicare Other | Admitting: Cardiology

## 2017-01-08 ENCOUNTER — Encounter: Payer: Self-pay | Admitting: Cardiology

## 2017-01-08 VITALS — BP 149/68 | HR 60 | Ht 73.0 in | Wt 229.4 lb

## 2017-01-08 DIAGNOSIS — E782 Mixed hyperlipidemia: Secondary | ICD-10-CM | POA: Diagnosis not present

## 2017-01-08 DIAGNOSIS — I10 Essential (primary) hypertension: Secondary | ICD-10-CM | POA: Diagnosis not present

## 2017-01-08 DIAGNOSIS — I493 Ventricular premature depolarization: Secondary | ICD-10-CM | POA: Diagnosis not present

## 2017-01-08 DIAGNOSIS — Z8679 Personal history of other diseases of the circulatory system: Secondary | ICD-10-CM

## 2017-01-08 NOTE — Patient Instructions (Signed)

## 2017-03-11 ENCOUNTER — Encounter: Payer: Self-pay | Admitting: Family

## 2017-03-21 ENCOUNTER — Encounter: Payer: Self-pay | Admitting: Family

## 2017-03-21 ENCOUNTER — Ambulatory Visit (INDEPENDENT_AMBULATORY_CARE_PROVIDER_SITE_OTHER): Payer: Medicare Other | Admitting: Family

## 2017-03-21 ENCOUNTER — Ambulatory Visit (HOSPITAL_COMMUNITY)
Admission: RE | Admit: 2017-03-21 | Discharge: 2017-03-21 | Disposition: A | Discharge status: Home / Self Care | Payer: Medicare Other | Source: Ambulatory Visit | Attending: Vascular Surgery | Admitting: Vascular Surgery

## 2017-03-21 VITALS — BP 139/77 | HR 75 | Temp 97.7°F | Resp 20 | Ht 73.0 in | Wt 229.0 lb

## 2017-03-21 DIAGNOSIS — I6521 Occlusion and stenosis of right carotid artery: Secondary | ICD-10-CM | POA: Insufficient documentation

## 2017-03-21 DIAGNOSIS — I6523 Occlusion and stenosis of bilateral carotid arteries: Secondary | ICD-10-CM

## 2017-03-21 DIAGNOSIS — Z9889 Other specified postprocedural states: Secondary | ICD-10-CM

## 2017-03-21 LAB — VAS US CAROTID
LCCADSYS: 92 cm/s
LCCAPSYS: 105 cm/s
LEFT ECA DIAS: -13 cm/s
LEFT VERTEBRAL DIAS: -16 cm/s
LICADDIAS: -36 cm/s
LICAPDIAS: -31 cm/s
Left CCA dist dias: 19 cm/s
Left CCA prox dias: 18 cm/s
Left ICA dist sys: -123 cm/s
Left ICA prox sys: -105 cm/s
RCCADSYS: -55 cm/s
RCCAPDIAS: 15 cm/s
RIGHT CCA MID DIAS: 15 cm/s
RIGHT VERTEBRAL DIAS: -15 cm/s
Right CCA prox sys: 89 cm/s

## 2017-03-21 NOTE — Patient Instructions (Signed)
Stroke Prevention Some medical conditions and behaviors are associated with an increased chance of having a stroke. You may prevent a stroke by making healthy choices and managing medical conditions. How can I reduce my risk of having a stroke?  Stay physically active. Get at least 30 minutes of activity on most or all days.  Do not smoke. It may also be helpful to avoid exposure to secondhand smoke.  Limit alcohol use. Moderate alcohol use is considered to be: ? No more than 2 drinks per day for men. ? No more than 1 drink per day for nonpregnant women.  Eat healthy foods. This involves: ? Eating 5 or more servings of fruits and vegetables a day. ? Making dietary changes that address high blood pressure (hypertension), high cholesterol, diabetes, or obesity.  Manage your cholesterol levels. ? Making food choices that are high in fiber and low in saturated fat, trans fat, and cholesterol may control cholesterol levels. ? Take any prescribed medicines to control cholesterol as directed by your health care provider.  Manage your diabetes. ? Controlling your carbohydrate and sugar intake is recommended to manage diabetes. ? Take any prescribed medicines to control diabetes as directed by your health care provider.  Control your hypertension. ? Making food choices that are low in salt (sodium), saturated fat, trans fat, and cholesterol is recommended to manage hypertension. ? Ask your health care provider if you need treatment to lower your blood pressure. Take any prescribed medicines to control hypertension as directed by your health care provider. ? If you are 18-39 years of age, have your blood pressure checked every 3-5 years. If you are 40 years of age or older, have your blood pressure checked every year.  Maintain a healthy weight. ? Reducing calorie intake and making food choices that are low in sodium, saturated fat, trans fat, and cholesterol are recommended to manage  weight.  Stop drug abuse.  Avoid taking birth control pills. ? Talk to your health care provider about the risks of taking birth control pills if you are over 35 years old, smoke, get migraines, or have ever had a blood clot.  Get evaluated for sleep disorders (sleep apnea). ? Talk to your health care provider about getting a sleep evaluation if you snore a lot or have excessive sleepiness.  Take medicines only as directed by your health care provider. ? For some people, aspirin or blood thinners (anticoagulants) are helpful in reducing the risk of forming abnormal blood clots that can lead to stroke. If you have the irregular heart rhythm of atrial fibrillation, you should be on a blood thinner unless there is a good reason you cannot take them. ? Understand all your medicine instructions.  Make sure that other conditions (such as anemia or atherosclerosis) are addressed. Get help right away if:  You have sudden weakness or numbness of the face, arm, or leg, especially on one side of the body.  Your face or eyelid droops to one side.  You have sudden confusion.  You have trouble speaking (aphasia) or understanding.  You have sudden trouble seeing in one or both eyes.  You have sudden trouble walking.  You have dizziness.  You have a loss of balance or coordination.  You have a sudden, severe headache with no known cause.  You have new chest pain or an irregular heartbeat. Any of these symptoms may represent a serious problem that is an emergency. Do not wait to see if the symptoms will go away.   Get medical help at once. Call your local emergency services (911 in [place] Do not drive yourself to the hospital. This information is not intended to replace advice given to you by your health care provider. Make sure you discuss any questions you have with your health care provider. Document Released: 09/13/2004 Document Revised: 01/12/2016 Document Reviewed: 02/06/2013 Elsevier  Interactive Patient Education  2017 Elsevier Inc.     Preventing Cerebrovascular Disease Arteries are blood vessels that carry blood that contains oxygen from the heart to all parts of the body. Cerebrovascular disease affects arteries that supply the brain. Any condition that blocks or disrupts blood flow to the brain can cause cerebrovascular disease. Brain cells that lose blood supply start to die within minutes (stroke). Stroke is the main danger of cerebrovascular disease. Atherosclerosis and high blood pressure are common causes of cerebrovascular disease. Atherosclerosis is narrowing and hardening of an artery that results when fat, cholesterol, calcium, or other substances (plaque) build up inside an artery. Plaque reduces blood flow through the artery. High blood pressure increases the risk of bleeding inside the brain. Making diet and lifestyle changes to prevent atherosclerosis and high blood pressure lowers your risk of cerebrovascular disease. What nutrition changes can be made?  Eat more fruits, vegetables, and whole grains.  Reduce how much saturated fat you eat. To do this, eat less red meat and fewer full-fat dairy products.  Eat healthy proteins instead of red meat. Healthy proteins include: ? Fish. Eat fish that contains heart-healthy omega-3 fatty acids, twice a week. Examples include salmon, albacore tuna, mackerel, and herring. ? Chicken. ? Nuts. ? Low-fat or nonfat yogurt.  Avoid processed meats, like bacon and lunchmeat.  Avoid foods that contain: ? A lot of sugar, such as sweets and drinks with added sugar. ? A lot of salt (sodium). Avoid adding extra salt to your food, as told by your health care provider. ? Trans fats, such as margarine and baked goods. Trans fats may be listed as "partially hydrogenated oils" on food labels.  Check food labels to see how much sodium, sugar, and trans fats are in foods.  Use vegetable oils that contain low amounts of  saturated fat, such as olive oil or canola oil. What lifestyle changes can be made?  Drink alcohol in moderation. This means no more than 1 drink a day for nonpregnant women and 2 drinks a day for men. One drink equals 12 oz of beer, 5 oz of wine, or 1 oz of hard liquor.  If you are overweight, ask your health care provider to recommend a weight-loss plan for you. Losing 5-10 lb (2.2-4.5 kg) can reduce your risk of diabetes, atherosclerosis, and high blood pressure.  Exercise for 30?60 minutes on most days, or as much as told by your health care provider. ? Do moderate-intensity exercise, such as brisk walking, bicycling, and water aerobics. Ask your health care provider which activities are safe for you.  Do not use any products that contain nicotine or tobacco, such as cigarettes and e-cigarettes. If you need help quitting, ask your health care provider. Why are these changes important? Making these changes lowers your risk of many diseases that can cause cerebrovascular disease and stroke. Stroke is a leading cause of death and disability. Making these changes also improves your overall health and quality of life. What can I do to lower my risk? The following factors make you more likely to develop cerebrovascular disease:  Being overweight.  Smoking.  Being physically inactive.    Eating a high-fat diet.  Having certain health conditions, such as: ? Diabetes. ? High blood pressure. ? Heart disease. ? Atherosclerosis. ? High cholesterol. ? Sickle cell disease.  Talk with your health care provider about your risk for cerebrovascular disease. Work with your health care provider to control diseases that you have that may contribute to cerebrovascular disease. Your health care provider may prescribe medicines to help prevent major causes of cerebrovascular disease. Where to find more information: Learn more about preventing cerebrovascular disease from:  National Heart, Lung, and  Blood Institute: www.nhlbi.nih.gov/health/health-topics/topics/stroke  Centers for Disease Control and Prevention: cdc.gov/stroke/about.htm  Summary  Cerebrovascular disease can lead to a stroke.  Atherosclerosis and high blood pressure are major causes of cerebrovascular disease.  Making diet and lifestyle changes can reduce your risk of cerebrovascular disease.  Work with your health care provider to get your risk factors under control to reduce your risk of cerebrovascular disease. This information is not intended to replace advice given to you by your health care provider. Make sure you discuss any questions you have with your health care provider. Document Released: 08/21/2015 Document Revised: 02/24/2016 Document Reviewed: 08/21/2015 Elsevier Interactive Patient Education  2018 Elsevier Inc.  

## 2017-03-21 NOTE — Progress Notes (Signed)
Chief Complaint: Follow up Extracranial Carotid Artery Stenosis   History of Present Illness  HARROLD FITCHETT is a 83 y.o. male who is status post left carotid endarterectomy with bovine pericardial patch angioplasty on 02/23/2016 by Dr. Trula Slade. This was done for symptomatic left carotid stenosis. The patient was having amaurosis fugax in the left eye prior to his operation. Intraoperative findings included a 90% stenosis with a ruptured plaque at the bifurcation with soft plaque.  The patient has not had any further episodes of amaurosis. He denies any neurologic symptoms.  Pt denies any claudication symptoms with walking.  He does have a hx of bilateral knee replacements and his knees feel improved; he is wearing a brace on his knee.   Pt states his cardiologist, Dr. Domenic Polite, is aware of his irregular cardiac rhythm. His documented Past Medical History includes ventricular bigeminy.   Pt Diabetic: no Pt smoker: non-smoker  Pt meds include: Statin : yes ASA: yes Other anticoagulants/antiplatelets: no   Past Medical History:  Diagnosis Date  . Arthritis   . Carotid artery occlusion   . Chronic lower back pain   . Coronary artery disease    Mild at cardiac catheterization 2013  . Essential hypertension   . History of blood transfusion 2008; 2010  . Mitral valve prolapse    Mild mitral regurgitation  . Mixed hyperlipidemia   . Renal cancer [facility])    Status post right nephrectomy  . Seizures ([place])    Childhood-82 yrs old "passed out"  . Takotsubo cardiomyopathy    February 2013  . Ventricular ectopy    Bigeminy    Social History Social History  Substance Use Topics  . Smoking status: Never Smoker  . Smokeless tobacco: Never Used  . Alcohol use No    Family History Family History  Problem Relation Age of Onset  . Cancer Other   . Stroke Other   . Coronary artery disease Other   . Hypertension Other   . Hypertension Mother   . Peripheral Artery  Disease Mother        had leg bypass, and died after surgery; believed to be from throwing a clot  . CVA Father   . Prostate cancer Father   . Anesthesia problems Neg Hx   . Hypotension Neg Hx   . Malignant hyperthermia Neg Hx   . Pseudochol deficiency Neg Hx     Surgical History Past Surgical History:  Procedure Laterality Date  . APPENDECTOMY    . CAROTID ENDARTERECTOMY Left 02/23/2016  . CATARACT EXTRACTION W/PHACO  03/22/2011   Procedure: CATARACT EXTRACTION PHACO AND INTRAOCULAR LENS PLACEMENT (IOC);  Surgeon: Tonny Branch;  [facility]: [facility];  Service: Ophthalmology;  Laterality: Right;  CDE  19.88  . CATARACT EXTRACTION W/PHACO  04/16/2011   Procedure: CATARACT EXTRACTION PHACO AND INTRAOCULAR LENS PLACEMENT (IOC);  Surgeon: Tonny Branch;  [facility]: [facility];  Service: Ophthalmology;  Laterality: Left;  CDE 21.13  . CHOLECYSTECTOMY  01/29/2012   Procedure: LAPAROSCOPIC CHOLECYSTECTOMY WITH INTRAOPERATIVE CHOLANGIOGRAM;  Surgeon: Joyice Faster. Cornett, MD;  Location: [place];  Service: General;  Laterality: N/A;  . ENDARTERECTOMY Left 02/23/2016   Procedure: LEFT CAROTID ENDARTERECTOMY WITH XENOSURE BOVINE PERICARDIUM PATCH ANGIOPLASTY;  Surgeon: Serafina Mitchell, MD;  Location: [place];  Service: Vascular;  Laterality: Left;  . JOINT REPLACEMENT    . KNEE ARTHROSCOPY Right   . LITHOTRIPSY    . NEPHRECTOMY Right 2008  . TOTAL KNEE ARTHROPLASTY  2010  . TOTAL KNEE  ARTHROPLASTY Left 2014    Allergies  Allergen Reactions  . Aleve [Naproxen Sodium] Other (See Comments)    Patient has only 1 kidney and Doctor  Doesn't want him to take  . Naproxen Sodium Other (See Comments)    Patient has only 1 kidney and Doctor Doesn't want him to take    Current Outpatient Prescriptions  Medication Sig Dispense Refill  . alfuzosin (UROXATRAL) 10 MG 24 hr tablet Take 10 mg by mouth daily with breakfast.     . amLODipine (NORVASC) 5 MG tablet Take 5 mg by mouth daily.    Marland Kitchen aspirin 325 MG EC tablet Take 325 mg by  mouth daily.    Marland Kitchen atorvastatin (LIPITOR) 40 MG tablet Take 40 mg by mouth daily.    . carvedilol (COREG) 6.25 MG tablet Take 6.25 mg by mouth 2 (two) times daily with a meal.    . Cholecalciferol (VITAMIN D3) 400 units CAPS Take 1,000 Units by mouth daily.     . diphenhydramine-acetaminophen (TYLENOL PM) 25-500 MG TABS tablet Take 1 tablet by mouth at bedtime as needed.    . furosemide (LASIX) 20 MG tablet Take 20 mg by mouth daily.     Marland Kitchen HYDROcodone-acetaminophen (LORTAB) 7.5-500 MG per tablet Take 1 tablet by mouth 2 (two) times a week. Wed, and sun mornings     . lisinopril (PRINIVIL,ZESTRIL) 40 MG tablet Take 40 mg by mouth every evening.     No current facility-administered medications for this visit.     Review of Systems : See HPI for pertinent positives and negatives.  Physical Examination  Vitals:   03/21/17 1147 03/21/17 1149  BP: (!) 143/78 139/77  Pulse: 75   Resp: 20   Temp: 97.7 F (36.5 C)   TempSrc: Oral   SpO2: 96%   Weight: 229 lb (103.9 kg)   Height: 6\' 1"  (1.854 m)    Body mass index is 30.21 kg/m.  General: WDWN obese male in NAD GAIT: normal Eyes: PERRLA Pulmonary:  Respirations are non-labored, good air movement in all fields, CTAB  Cardiac: Irregular rhythm with regular rate, no detected murmur.  VASCULAR EXAM Carotid Bruits Right Left   Negative Negative    Aorta is not palpable. Radial pulses are 2+ palpable and equal.                                                                                                                                          LE Pulses Right Left       POPLITEAL   palpable   not palpable       POSTERIOR TIBIAL   palpable    palpable        DORSALIS PEDIS      ANTERIOR TIBIAL  palpable   palpable     Gastrointestinal: soft, nontender, BS WNL, no r/g,  no palpable masses.  Musculoskeletal: No  muscle atrophy/wasting. M/S 5/5 throughout, extremities without ischemic changes.  Neurologic: A&O X 3;  appropriate affect, speech is normal, CN 2-12 intact, pain and light touch intact in extremities, Motor exam as listed above.    Assessment: ARBEN PACKMAN is a 83 y.o. male who is status post left carotid endarterectomy with bovine pericardial patch angioplasty on 02/23/2016. This was done for symptomatic left carotid stenosis. The patient was having amaurosis fugax in the left eye prior to his operation.  He has had no subsequent neurological event.   Fortunately he does not have DM and has never used tobacco. He takes a daily statin and 325 mg ASA.  DATA Carotid Duplex (03/21/17): 60-79% right ICA stenosis Left ICA (CEA site) with no restenosis. Bilateral vertebral artery flow is antegrade.  Bilateral subclavian artery waveforms are normal.  No change since the exam on 09-13-16.    Plan: Follow-up in 6 months with Carotid Duplex scan.    I discussed in depth with the patient the nature of atherosclerosis, and emphasized the importance of maximal medical management including strict control of blood pressure, blood glucose, and lipid levels, obtaining regular exercise, and continued cessation of smoking.  The patient is aware that without maximal medical management the underlying atherosclerotic disease process will progress, limiting the benefit of any interventions. The patient was given information about stroke prevention and what symptoms should prompt the patient to seek immediate medical care. Thank you for allowing [place] to participate in this patient's care.  Clemon Chambers, RN, MSN, FNP-C [facility] [place] Office: [phone]  Clinic Physician: Oneida Alar  03/21/17 12:32 PM

## 2017-04-10 NOTE — Addendum Note (Signed)
Addended by: Lianne Cure A on: 04/10/2017 12:40 PM   Modules accepted: Orders

## 2017-10-04 ENCOUNTER — Ambulatory Visit: Payer: Medicare Other | Admitting: Family

## 2017-10-04 ENCOUNTER — Encounter: Payer: Self-pay | Admitting: Family

## 2017-10-04 ENCOUNTER — Other Ambulatory Visit: Payer: Self-pay

## 2017-10-04 ENCOUNTER — Ambulatory Visit (HOSPITAL_COMMUNITY)
Admission: RE | Admit: 2017-10-04 | Discharge: 2017-10-04 | Disposition: A | Discharge status: Home / Self Care | Payer: Medicare Other | Source: Ambulatory Visit | Attending: Family | Admitting: Family

## 2017-10-04 VITALS — BP 174/94 | HR 61 | Temp 97.3°F | Resp 20 | Ht 73.0 in | Wt 233.0 lb

## 2017-10-04 DIAGNOSIS — Z9889 Other specified postprocedural states: Secondary | ICD-10-CM | POA: Diagnosis not present

## 2017-10-04 DIAGNOSIS — I6523 Occlusion and stenosis of bilateral carotid arteries: Secondary | ICD-10-CM | POA: Insufficient documentation

## 2017-10-04 LAB — VAS US CAROTID
LCCADDIAS: -21 cm/s
LEFT ECA DIAS: -18 cm/s
LICAPDIAS: -25 cm/s
Left CCA dist sys: -68 cm/s
Left CCA prox dias: 18 cm/s
Left CCA prox sys: 86 cm/s
Left ICA dist dias: -22 cm/s
Left ICA dist sys: -75 cm/s
Left ICA prox sys: -72 cm/s
RIGHT CCA MID DIAS: 14 cm/s
RIGHT ECA DIAS: 24 cm/s
Right CCA prox dias: 15 cm/s
Right CCA prox sys: 65 cm/s
Right cca dist sys: -72 cm/s

## 2017-10-04 NOTE — Progress Notes (Signed)
Chief Complaint: Follow up Extracranial Carotid Artery Stenosis   History of Present Illness  Darius Baxter is a 84 y.o. male who is status post left carotid endarterectomy with bovine pericardial patch angioplasty on 02/23/2016 by Dr. Trula Slade. This was done for symptomatic left carotid stenosis. The patient was having amaurosis fugax in the left eye prior to his operation. Intraoperative findings included a 90% stenosis with a ruptured plaque at the bifurcation with soft plaque.  The patient has not had any further episodes of amaurosis. He denies any neurologic symptoms.  Pt denies any claudication symptoms with walking.  He does have a hx of bilateral knee replacements and his knees feel improved; he is wearing a brace on both knees.   He has chronic back pain, wears a brace for that.   Pt states his cardiologist, Dr. Domenic Polite, is aware of his irregular cardiac rhythm. His documented Past Medical History includes ventricular bigeminy.   Pt Diabetic: no Pt smoker: non-smoker  Pt meds include: Statin : yes ASA: yes Other anticoagulants/antiplatelets: no   Past Medical History:  Diagnosis Date  . Arthritis   . Carotid artery occlusion   . Chronic lower back pain   . Coronary artery disease    Mild at cardiac catheterization 2013  . Essential hypertension   . History of blood transfusion 2008; 2010  . Mitral valve prolapse    Mild mitral regurgitation  . Mixed hyperlipidemia   . Renal cancer [facility])    Status post right nephrectomy  . Seizures ([place])    Childhood-84 yrs old "passed out"  . Takotsubo cardiomyopathy    February 2013  . Ventricular ectopy    Bigeminy    Social History Social History   Tobacco Use  . Smoking status: Never Smoker  . Smokeless tobacco: Never Used  Substance Use Topics  . Alcohol use: No    Alcohol/week: 0.0 oz  . Drug use: No    Family History Family History  Problem Relation Age of Onset  . Cancer Other   . Stroke  Other   . Coronary artery disease Other   . Hypertension Other   . Hypertension Mother   . Peripheral Artery Disease Mother        had leg bypass, and died after surgery; believed to be from throwing a clot  . CVA Father   . Prostate cancer Father   . Anesthesia problems Neg Hx   . Hypotension Neg Hx   . Malignant hyperthermia Neg Hx   . Pseudochol deficiency Neg Hx     Surgical History Past Surgical History:  Procedure Laterality Date  . APPENDECTOMY    . CAROTID ENDARTERECTOMY Left 02/23/2016  . CATARACT EXTRACTION W/PHACO  03/22/2011   Procedure: CATARACT EXTRACTION PHACO AND INTRAOCULAR LENS PLACEMENT (IOC);  Surgeon: Tonny Branch;  [facility];  Service: Ophthalmology;  Laterality: Right;  CDE  19.88  . CATARACT EXTRACTION W/PHACO  04/16/2011   Procedure: CATARACT EXTRACTION PHACO AND INTRAOCULAR LENS PLACEMENT (IOC);  Surgeon: Tonny Branch;  [facility];  Service: Ophthalmology;  Laterality: Left;  CDE 21.13  . CHOLECYSTECTOMY  01/29/2012   Procedure: LAPAROSCOPIC CHOLECYSTECTOMY WITH INTRAOPERATIVE CHOLANGIOGRAM;  Surgeon: Joyice Faster. Cornett, MD;  Location: [place];  Service: General;  Laterality: N/A;  . ENDARTERECTOMY Left 02/23/2016   Procedure: LEFT CAROTID ENDARTERECTOMY WITH XENOSURE BOVINE PERICARDIUM PATCH ANGIOPLASTY;  Surgeon: Serafina Mitchell, MD;  Location: [place];  Service: Vascular;  Laterality: Left;  . JOINT REPLACEMENT    .  KNEE ARTHROSCOPY Right   . LITHOTRIPSY    . NEPHRECTOMY Right 2008  . TOTAL KNEE ARTHROPLASTY  2010  . TOTAL KNEE ARTHROPLASTY Left 2014    Allergies  Allergen Reactions  . Aleve [Naproxen Sodium] Other (See Comments)    Patient has only 1 kidney and Doctor  Doesn't want him to take  . Naproxen Sodium Other (See Comments)    Patient has only 1 kidney and Doctor Doesn't want him to take    Current Outpatient Medications  Medication Sig Dispense Refill  . alfuzosin (UROXATRAL) 10 MG 24 hr tablet Take 10 mg by mouth daily with breakfast.      . amLODipine (NORVASC) 5 MG tablet Take 5 mg by mouth daily.    Marland Kitchen aspirin 325 MG EC tablet Take 325 mg by mouth daily.    Marland Kitchen atorvastatin (LIPITOR) 40 MG tablet Take 40 mg by mouth daily.    . carvedilol (COREG) 6.25 MG tablet Take 6.25 mg by mouth 2 (two) times daily with a meal.    . Cholecalciferol (VITAMIN D3) 400 units CAPS Take 1,000 Units by mouth daily.     . diphenhydramine-acetaminophen (TYLENOL PM) 25-500 MG TABS tablet Take 1 tablet by mouth at bedtime as needed.    . furosemide (LASIX) 20 MG tablet Take 20 mg by mouth daily.     Marland Kitchen HYDROcodone-acetaminophen (LORTAB) 7.5-500 MG per tablet Take 1 tablet by mouth 2 (two) times a week. Wed, and sun mornings     . lisinopril (PRINIVIL,ZESTRIL) 40 MG tablet Take 40 mg by mouth every evening.     No current facility-administered medications for this visit.     Review of Systems : See HPI for pertinent positives and negatives.  Physical Examination  Vitals:   10/04/17 1031 10/04/17 1033  BP: (!) 164/93 (!) 174/94  Pulse: 61   Resp: 20   Temp: (!) 97.3 F (36.3 C)   TempSrc: Oral   SpO2: 98%   Weight: 233 lb (105.7 kg)   Height: 6\' 1"  (1.854 m)    Body mass index is 30.74 kg/m.  General: WDWN obese malein NAD GAIT:normal HENT: No gross abnromalities Eyes: PERRLA Pulmonary: Respirations are non-labored, good air movement in all fields, CTAB Cardiac: Irregular rhythm with controlled rate, nodetected murmur.  VASCULAR EXAM Carotid Bruits Right Left   Negative Negative   Abdominal aortic pulse is notpalpable. Radial pulses are 2+palpable and equal.   LE Pulses Right Left  POPLITEAL palpable  notpalpable  POSTERIOR TIBIAL palpable  palpable   DORSALIS PEDIS ANTERIOR TIBIAL palpable  palpable     Gastrointestinal: soft, nontender, BS WNL, no r/g, nopalpable masses. Musculoskeletal: Nomuscle atrophy/wasting. M/S 5/5 throughout,  extremities without ischemic changes. Back brace and bilateral knee braces in place.  Dermatologic: No rash, no cellulitis, no ulcers. 1+ non pitting edema in both ankles (takes amlodipine).  Neurologic: A&O X 3; appropriate affect, speech is normal, CN 2-12 intact, pain and light touch intact in extremities, Motor exam as listed above. Psychiatric: Normal thought content, mood appropriate to clinical situation.    Assessment: Darius Baxter is a 84 y.o. male who is status post left carotid endarterectomy with bovine pericardial patch angioplasty on 02/23/2016. This was done for symptomatic left carotid stenosis. The patient was having amaurosis fugax in the left eye prior to his operation.  He has had no subsequent neurological event.   Fortunately he does not have DM and has never used tobacco. He takes a daily statin and  325 mg ASA.  His blood pressure is elevated at this time, he will be seeing his cardiologist in a week or two.  His cardiac rhythm is irregular, pt states his cardiologist is aware, and pt is asymptomatic; he denies feeing light headed other than mild orthostatic, denies dyspnea, fatigue, or chest pain.   DATA Carotid Duplex (10/04/17): 60-79% right ICA stenosis Left ICA (CEA site) with no restenosis. Bilateral vertebral artery flow is antegrade.  Bilateral subclavian artery waveforms are normal.  No change since the exams on 09-13-16 and 03-21-17.   Plan: Follow-up in 34monthswith Carotid Duplex scan.   I discussed in depth with the patient the nature of atherosclerosis, and emphasized the importance of maximal medical management including strict control of blood pressure, blood glucose, and lipid levels, obtaining regular exercise, and continued cessation of smoking.  The patient is aware that without maximal medical management the underlying atherosclerotic disease process will progress, limiting the benefit of any interventions. The patient was given  information about stroke prevention and what symptoms should prompt the patient to seek immediate medical care. Thank you for allowing [place] to participate in this patient's care.  Clemon Chambers, RN, MSN, FNP-C [facility] [place] Office: [phone]  Clinic Physician: Donzetta Matters  10/04/17 10:37 AM

## 2017-10-04 NOTE — Patient Instructions (Addendum)

## 2017-10-07 ENCOUNTER — Other Ambulatory Visit: Payer: Self-pay

## 2017-10-07 DIAGNOSIS — I6523 Occlusion and stenosis of bilateral carotid arteries: Secondary | ICD-10-CM

## 2018-01-10 ENCOUNTER — Ambulatory Visit: Payer: Medicare Other | Admitting: Cardiology

## 2018-02-24 NOTE — Progress Notes (Signed)
Cardiology Office Note  Date: 02/25/2018   ID: Darius Baxter, DOB [birth year], MRN [id number]  PCP: Dione Housekeeper, MD  Primary Cardiologist: Rozann Lesches, MD   Chief Complaint  Patient presents with  . History of cardiomyopathy    History of Present Illness: Darius Baxter is an 84 y.o. male last seen in May 2018.  He is here for a routine follow-up visit.  Reports no exertional chest pain or breathlessness beyond NYHA class II with typical activities.  He remains functional with ADLs including yard work.  I reviewed his medications.  Cardiac regimen includes aspirin, Norvasc, Lipitor, Coreg, Lasix, and lisinopril.  He reports no intolerances.  I reviewed his lab work from May per Dr. Edrick Oh.  LDL was well-controlled at 48.  I personally reviewed his ECG today which shows sinus rhythm with nonspecific ST changes.  Last echocardiogram was in 2016 as outlined below.  Past Medical History:  Diagnosis Date  . Arthritis   . Carotid artery occlusion   . Chronic lower back pain   . Coronary artery disease    Mild at cardiac catheterization 2013  . Essential hypertension   . History of blood transfusion 2008; 2010  . Mitral valve prolapse    Mild mitral regurgitation  . Mixed hyperlipidemia   . Renal cancer [facility])    Status post right nephrectomy  . Seizures ([place])    Childhood-84 yrs old "passed out"  . Takotsubo cardiomyopathy    February 2013  . Ventricular ectopy    Bigeminy    Past Surgical History:  Procedure Laterality Date  . APPENDECTOMY    . CAROTID ENDARTERECTOMY Left 02/23/2016  . CATARACT EXTRACTION W/PHACO  03/22/2011   Procedure: CATARACT EXTRACTION PHACO AND INTRAOCULAR LENS PLACEMENT (IOC);  Surgeon: Tonny Branch;  [facility];  Service: Ophthalmology;  Laterality: Right;  CDE  19.88  . CATARACT EXTRACTION W/PHACO  04/16/2011   Procedure: CATARACT EXTRACTION PHACO AND INTRAOCULAR LENS PLACEMENT (IOC);  Surgeon: Tonny Branch;  [facility];   Service: Ophthalmology;  Laterality: Left;  CDE 21.13  . CHOLECYSTECTOMY  01/29/2012   Procedure: LAPAROSCOPIC CHOLECYSTECTOMY WITH INTRAOPERATIVE CHOLANGIOGRAM;  Surgeon: Joyice Faster. Cornett, MD;  Location: [place];  Service: General;  Laterality: N/A;  . ENDARTERECTOMY Left 02/23/2016   Procedure: LEFT CAROTID ENDARTERECTOMY WITH XENOSURE BOVINE PERICARDIUM PATCH ANGIOPLASTY;  Surgeon: Serafina Mitchell, MD;  Location: [place];  Service: Vascular;  Laterality: Left;  . JOINT REPLACEMENT    . KNEE ARTHROSCOPY Right   . LITHOTRIPSY    . NEPHRECTOMY Right 2008  . TOTAL KNEE ARTHROPLASTY  2010  . TOTAL KNEE ARTHROPLASTY Left 2014    Current Outpatient Medications  Medication Sig Dispense Refill  . alfuzosin (UROXATRAL) 10 MG 24 hr tablet Take 10 mg by mouth daily with breakfast.     . amLODipine (NORVASC) 5 MG tablet Take 5 mg by mouth daily.    Marland Kitchen atorvastatin (LIPITOR) 40 MG tablet Take 40 mg by mouth daily.    . carvedilol (COREG) 6.25 MG tablet Take 6.25 mg by mouth 2 (two) times daily with a meal.    . Cholecalciferol (VITAMIN D3) 400 units CAPS Take 1,000 Units by mouth daily.     . diphenhydramine-acetaminophen (TYLENOL PM) 25-500 MG TABS tablet Take 1 tablet by mouth at bedtime as needed.    . furosemide (LASIX) 20 MG tablet Take 20 mg by mouth daily.     Marland Kitchen HYDROcodone-acetaminophen (LORTAB) 7.5-500 MG per tablet Take 1  tablet by mouth 2 (two) times a week. Wed, and sun mornings     . lisinopril (PRINIVIL,ZESTRIL) 40 MG tablet Take 40 mg by mouth every evening.    Marland Kitchen aspirin EC 81 MG tablet Take 1 tablet (81 mg total) by mouth daily. 30 tablet 3   No current facility-administered medications for this visit.    Allergies:  Aleve [naproxen sodium] and Naproxen sodium   Social History: The patient  reports that he has never smoked. He has never used smokeless tobacco. He reports that he does not drink alcohol or use drugs.   ROS:  Please see the history of present illness. Otherwise, complete  review of systems is positive for none.  All other systems are reviewed and negative.   Physical Exam: VS:  BP 128/70   Pulse 66   Ht 6\' 1"  (1.854 m)   Wt 226 lb (102.5 kg)   SpO2 96%   BMI 29.82 kg/m , BMI Body mass index is 29.82 kg/m.  Wt Readings from Last 3 Encounters:  02/25/18 226 lb (102.5 kg)  10/04/17 233 lb (105.7 kg)  03/21/17 229 lb (103.9 kg)    General: Patient appears comfortable at rest. HEENT: Conjunctiva and lids normal, oropharynx clear. Neck: Supple, no elevated JVP or carotid bruits, no thyromegaly. Lungs: Clear to auscultation, nonlabored breathing at rest. Cardiac: Regular rate and rhythm, no S3 soft apical systolic murmur, no pericardial rub. Abdomen: Soft, nontender, bowel sounds present. Extremities: No pitting edema, distal pulses 2+. Skin: Warm and dry. Musculoskeletal: No kyphosis. Neuropsychiatric: Alert and oriented x3, affect grossly appropriate.  ECG: I personally reviewed the tracing from 01/08/2017 which showed sinus rhythm with PVC.  Recent Labwork:  May 2019: Hemoglobin A1c 5.8, hemoglobin 14.4, platelets 145, BUN 20, creatinine 1.05, potassium 5.1, AST 13, ALT 12, cholesterol 128, triglycerides 168, HDL 46, LDL 48  Other Studies Reviewed Today:  Carotid Dopplers 10/04/2017: Final Interpretation: Right Carotid: Velocities in the right ICA are consistent with a 60-79%        stenosis.  Left Carotid: There is no evidence of stenosis in the left ICA/endarterectomy       site.  Vertebrals: Both vertebral arteries were patent with antegrade flow. Subclavians: Normal flow hemodynamics were seen in bilateral subclavian       arteries.  Echocardiogram 01/13/2015: Study Conclusions  - Left ventricle: The cavity size was normal. Wall thickness was   normal. Systolic function was normal. The estimated ejection   fraction was in the range of 60% to 65%. Wall motion was normal;   there were no regional wall motion  abnormalities. The study is   not technically sufficient to allow evaluation of LV diastolic   function. Frequent ecopty limits the evaluation of diastolic   function. - Aortic valve: Mildly calcified annulus. Trileaflet; mildly   thickened leaflets. - Mitral valve: Mildly calcified annulus. Mildly thickened leaflets. - Left atrium: The atrium was mildly to moderately dilated. - Atrial septum: No defect or patent foramen ovale was identified. - Technically adequate study.  Assessment and Plan:  1.  History of stress-induced cardiomyopathy with normalization of LVEF.  Last echocardiogram was in 2016, we plan to obtain an updated study.  Otherwise continue stable cardiac regimen as there has been a decline in LVEF.  From a symptomatic perspective he has been stable.  2.  Essential hypertension, blood pressure is well controlled today.  3.  Mixed hyperlipidemia, tolerating Lipitor.  He has had very good LDL control and follows  with Dr. Edrick Oh.  4.  History of mitral valve prolapse and mild mitral regurgitation.  This will also be reassessed by echocardiogram.  Current medicines were reviewed with the patient today.   Orders Placed This Encounter  Procedures  . EKG 12-Lead  . ECHOCARDIOGRAM COMPLETE    Disposition: Follow-up in 1 year.  Signed, Satira Sark, MD, [facility] 02/25/2018 10:09 AM    [facility] [place], [place], [place] [address] Phone: [phone]; Fax: [phone]

## 2018-02-25 ENCOUNTER — Encounter: Payer: Self-pay | Admitting: Cardiology

## 2018-02-25 ENCOUNTER — Ambulatory Visit: Payer: Medicare Other | Admitting: Cardiology

## 2018-02-25 VITALS — BP 128/70 | HR 66 | Ht 73.0 in | Wt 226.0 lb

## 2018-02-25 DIAGNOSIS — I10 Essential (primary) hypertension: Secondary | ICD-10-CM

## 2018-02-25 DIAGNOSIS — E782 Mixed hyperlipidemia: Secondary | ICD-10-CM | POA: Diagnosis not present

## 2018-02-25 DIAGNOSIS — I5181 Takotsubo syndrome: Secondary | ICD-10-CM | POA: Diagnosis not present

## 2018-02-25 DIAGNOSIS — Z8679 Personal history of other diseases of the circulatory system: Secondary | ICD-10-CM

## 2018-02-25 MED ORDER — ASPIRIN EC 81 MG PO TBEC: 81.0000 mg | DELAYED_RELEASE_TABLET | Freq: Every day | ORAL | 3 refills | Status: AC

## 2018-02-25 NOTE — Patient Instructions (Signed)
Medication Instructions:  Your physician has recommended you make the following change in your medication:    REDUCE Aspirin to 81 mg daily   Please continue all other medications as prescribed  Labwork: NONE  Testing/Procedures: Your physician has requested that you have an echocardiogram. Echocardiography is a painless test that uses sound waves to create images of your heart. It provides your doctor with information about the size and shape of your heart and how well your heart's chambers and valves are working. This procedure takes approximately one hour. There are no restrictions for this procedure.  Follow-Up: Your physician wants you to follow-up in: [place]. You will receive a reminder letter in the mail two months in advance. If you don't receive a letter, please call our office to schedule the follow-up appointment.  Any Other Special Instructions Will Be Listed Below (If Applicable).  If you need a refill on your cardiac medications before your next appointment, please call your pharmacy.

## 2018-03-20 ENCOUNTER — Ambulatory Visit (INDEPENDENT_AMBULATORY_CARE_PROVIDER_SITE_OTHER): Payer: Medicare Other

## 2018-03-20 ENCOUNTER — Other Ambulatory Visit: Payer: Self-pay

## 2018-03-20 ENCOUNTER — Other Ambulatory Visit: Payer: Medicare Other

## 2018-03-20 DIAGNOSIS — I5181 Takotsubo syndrome: Secondary | ICD-10-CM

## 2018-03-21 ENCOUNTER — Telehealth: Payer: Self-pay | Admitting: *Deleted

## 2018-03-21 NOTE — Telephone Encounter (Signed)
-----   Message from Satira Sark, MD sent at 03/20/2018  5:22 PM EDT ----- Results reviewed.  LVEF remains normal at 55 to 60%.  Mild mitral regurgitation is stable.  Continue with current medications and follow-up plan. A copy of this test should be forwarded to Dione Housekeeper, MD.

## 2018-03-21 NOTE — Telephone Encounter (Signed)
Patient informed and copy sent to PCP. 

## 2018-04-02 ENCOUNTER — Encounter (HOSPITAL_COMMUNITY): Payer: Medicare Other

## 2018-04-02 ENCOUNTER — Ambulatory Visit: Payer: Medicare Other | Admitting: Family

## 2018-04-08 ENCOUNTER — Ambulatory Visit: Payer: Medicare Other | Admitting: Family

## 2018-04-08 ENCOUNTER — Encounter (HOSPITAL_COMMUNITY): Payer: Medicare Other

## 2018-05-22 ENCOUNTER — Ambulatory Visit (HOSPITAL_COMMUNITY)
Admission: RE | Admit: 2018-05-22 | Discharge: 2018-05-22 | Disposition: A | Discharge status: Home / Self Care | Payer: Medicare Other | Source: Ambulatory Visit | Attending: Family | Admitting: Family

## 2018-05-22 ENCOUNTER — Other Ambulatory Visit: Payer: Self-pay

## 2018-05-22 ENCOUNTER — Encounter: Payer: Self-pay | Admitting: Family

## 2018-05-22 ENCOUNTER — Ambulatory Visit: Payer: Medicare Other | Admitting: Family

## 2018-05-22 VITALS — BP 145/73 | HR 67 | Temp 97.8°F | Resp 20 | Ht 73.0 in | Wt 224.0 lb

## 2018-05-22 DIAGNOSIS — I6523 Occlusion and stenosis of bilateral carotid arteries: Secondary | ICD-10-CM

## 2018-05-22 DIAGNOSIS — I499 Cardiac arrhythmia, unspecified: Secondary | ICD-10-CM

## 2018-05-22 DIAGNOSIS — Z9889 Other specified postprocedural states: Secondary | ICD-10-CM | POA: Diagnosis not present

## 2018-05-22 NOTE — Patient Instructions (Signed)

## 2018-05-22 NOTE — Progress Notes (Signed)
Chief Complaint: Follow up Extracranial Carotid Artery Stenosis   History of Present Illness  Darius Baxter is a 84 y.o. male who is status post left carotid endarterectomy with bovine pericardial patch angioplasty on 02/23/2016 by Dr. Trula Slade. This was done for symptomatic left carotid stenosis. The patient was having amaurosis fugax in the left eye prior to his operation. Intraoperative findings included a 90% stenosis with a ruptured plaque at the bifurcation with soft plaque.  The patient has not had any further episodes of amaurosis. He denies any neurologic symptoms.  Pt denies any claudication symptoms with walking.  He does have a hx of bilateral knee replacements and his knees feel improved; he is wearing a brace on both knees.   He has chronic back pain, he stopped wearing his back brace, states this is non operable.    Pt states his cardiologist, Dr. Domenic Polite, is aware of his asymptomatic irregular cardiac rhythm.His documented Past Medical History includes ventricular bigeminy.Pt states he has had known irregular cardiac rhythm since childhood. He denies chest pain, denies dyspnea. He dos report feeling light headed, slightly worsening over the years.   He has recently seen his ophthalmologist; pt states he told his ophthalmologist about gradual loss of vision that he has been experiencing in his right eye.   Diabetic: no Tobacco use: non-smoker  Pt meds include: Statin : yes ASA: yes, 81 mg Other anticoagulants/antiplatelets: no    Past Medical History:  Diagnosis Date  . Arthritis   . Carotid artery occlusion   . Chronic lower back pain   . Coronary artery disease    Mild at cardiac catheterization 2013  . Essential hypertension   . History of blood transfusion 2008; 2010  . Mitral valve prolapse    Mild mitral regurgitation  . Mixed hyperlipidemia   . Renal cancer [facility])    Status post right nephrectomy  . Seizures ([place])    Childhood-84 yrs  old "passed out"  . Takotsubo cardiomyopathy    February 2013  . Ventricular ectopy    Bigeminy    Social History Social History   Tobacco Use  . Smoking status: Never Smoker  . Smokeless tobacco: Never Used  Substance Use Topics  . Alcohol use: No    Alcohol/week: 0.0 standard drinks  . Drug use: No    Family History Family History  Problem Relation Age of Onset  . Cancer Other   . Stroke Other   . Coronary artery disease Other   . Hypertension Other   . Hypertension Mother   . Peripheral Artery Disease Mother        had leg bypass, and died after surgery; believed to be from throwing a clot  . CVA Father   . Prostate cancer Father   . Anesthesia problems Neg Hx   . Hypotension Neg Hx   . Malignant hyperthermia Neg Hx   . Pseudochol deficiency Neg Hx     Surgical History Past Surgical History:  Procedure Laterality Date  . APPENDECTOMY    . CAROTID ENDARTERECTOMY Left 02/23/2016  . CATARACT EXTRACTION W/PHACO  03/22/2011   Procedure: CATARACT EXTRACTION PHACO AND INTRAOCULAR LENS PLACEMENT (IOC);  Surgeon: Tonny Branch;  Location: [facility];  Service: Ophthalmology;  Laterality: Right;  CDE  19.88  . CATARACT EXTRACTION W/PHACO  04/16/2011   Procedure: CATARACT EXTRACTION PHACO AND INTRAOCULAR LENS PLACEMENT (IOC);  Surgeon: Tonny Branch;  Location: [facility];  Service: Ophthalmology;  Laterality: Left;  CDE 21.13  . CHOLECYSTECTOMY  01/29/2012   Procedure: LAPAROSCOPIC CHOLECYSTECTOMY WITH INTRAOPERATIVE CHOLANGIOGRAM;  Surgeon: Joyice Faster. Cornett, MD;  Location: [place];  Service: General;  Laterality: N/A;  . ENDARTERECTOMY Left 02/23/2016   Procedure: LEFT CAROTID ENDARTERECTOMY WITH XENOSURE BOVINE PERICARDIUM PATCH ANGIOPLASTY;  Surgeon: Serafina Mitchell, MD;  Location: [place];  Service: Vascular;  Laterality: Left;  . JOINT REPLACEMENT    . KNEE ARTHROSCOPY Right   . LITHOTRIPSY    . NEPHRECTOMY Right 2008  . TOTAL KNEE ARTHROPLASTY  2010  . TOTAL KNEE ARTHROPLASTY Left 2014     Allergies  Allergen Reactions  . Aleve [Naproxen Sodium] Other (See Comments)    Patient has only 1 kidney and Doctor  Doesn't want him to take  . Naproxen Sodium Other (See Comments)    Patient has only 1 kidney and Doctor Doesn't want him to take    Current Outpatient Medications  Medication Sig Dispense Refill  . alfuzosin (UROXATRAL) 10 MG 24 hr tablet Take 10 mg by mouth daily with breakfast.     . amLODipine (NORVASC) 5 MG tablet Take 5 mg by mouth daily.    Marland Kitchen aspirin EC 81 MG tablet Take 1 tablet (81 mg total) by mouth daily. 30 tablet 3  . atorvastatin (LIPITOR) 40 MG tablet Take 40 mg by mouth daily.    . carvedilol (COREG) 6.25 MG tablet Take 6.25 mg by mouth 2 (two) times daily with a meal.    . Cholecalciferol (VITAMIN D) 2000 units tablet Take 2,000 Units by mouth daily.    . diphenhydramine-acetaminophen (TYLENOL PM) 25-500 MG TABS tablet Take 1 tablet by mouth at bedtime as needed.    . furosemide (LASIX) 20 MG tablet Take 20 mg by mouth daily.     Marland Kitchen HYDROcodone-acetaminophen (LORTAB) 7.5-500 MG per tablet Take 1 tablet by mouth 2 (two) times a week. Wed, and sun mornings     . lisinopril (PRINIVIL,ZESTRIL) 40 MG tablet Take 40 mg by mouth every evening.     No current facility-administered medications for this visit.     Review of Systems : See HPI for pertinent positives and negatives.  Physical Examination  Vitals:   05/22/18 0928 05/22/18 0929  BP: (!) 160/83 (!) 145/73  Pulse: 67   Resp: 20   Temp: 97.8 F (36.6 C)   TempSrc: Oral   SpO2: 95%   Weight: 224 lb (101.6 kg)   Height: 6\' 1"  (1.854 m)    Body mass index is 29.55 kg/m.  General: WDWNmalein NAD GAIT:normal HENT: No gross abnromalities Eyes: PERRLA Pulmonary: Respirations are non-labored, good air movementin all fields, CTAB Cardiac: Irregularrhythmwith controlled rate, nodetected murmur.  VASCULAR EXAM Carotid Bruits Right Left   Negative Negative   Abdominal aortic  pulse is notpalpable. Radial pulses are 2+palpable and equal.   LE Pulses Right Left  POPLITEAL palpable  notpalpable  POSTERIOR TIBIAL palpable  palpable   DORSALIS PEDIS ANTERIOR TIBIAL palpable  palpable     Gastrointestinal: soft, nontender, BS WNL, no r/g, nopalpable masses. Musculoskeletal: Nomuscle atrophy/wasting. M/S 5/5 throughout, extremities without ischemic changes. Bilateral knee braces in place.  Dermatologic: No rash, no cellulitis, no ulcers. 1+ non pitting edema in both ankles (takes amlodipine).  Neurologic: A&O X 3;appropriateaffect,speech is normal,CN 2-12 intact, pain and light touch intact in extremities, Motor exam as listed above. Psychiatric: Normal thought content, mood appropriate to clinical situation     Assessment: Darius Baxter is a 84 y.o. male who is status post left carotid  endarterectomy with bovine pericardial patch angioplasty on 02/23/2016. This was done for symptomatic left carotid stenosis. The patient was having amaurosis fugax in the left eye prior to his operation.  He has had no subsequent neurological event.   Fortunately he does not have DM and has never used tobacco. He takes a daily statin and 81 mg ASA.  His cardiac rhythm is irregular, pt states his cardiologist is aware, and pt is asymptomatic; he denies feeing light headed other than mild orthostasis, denies dyspnea, fatigue, or chest pain. Pt states he has had known irregular cardiac rhythm since childhood.   I spoke with Dr. Oneida Alar re pt asymptomatic right carotid artery stenosis at 80-99%, he advised that I speak with Dr. Trula Slade. I spoke with Dr. Trula Slade by phone who advised that he wants to see pt this Monday, 05-26-18, to discuss possible intervention for right carotid artery stenosis. I advised him that his schedule is overbooked for that date and my schedule is full that date; he advised that the  schedulers move several of his pts on to the PA's schedule, if possible. I spoke with Lattie Haw, clinical nurse manager re this.    DATA  Carotid Duplex (05-22-18); Right ICA: 80-99% stenosis Left ICA: CEA site with 1-39% stenosis Bilateral vertebral artery flow is antegrade.  Bilateral subclavian artery waveforms are normal.  Increased stenosis in the bilateral ICA compared to the exam on 10-04-17.   Plan: Follow-up on 05-26-18 with Dr. Trula Slade to discuss possible right carotid artery intervention.  I discussed in depth with the patient the nature of atherosclerosis, and emphasized the importance of maximal medical management including strict control of blood pressure, blood glucose, and lipid levels, obtaining regular exercise, and continued cessation of smoking.  The patient is aware that without maximal medical management the underlying atherosclerotic disease process will progress, limiting the benefit of any interventions. The patient was given information about stroke prevention and what symptoms should prompt the patient to seek immediate medical care. Thank you for allowing [place] to participate in this patient's care.  Clemon Chambers, RN, MSN, FNP-C [facility] [place] Office: [facility] Physician: Oneida Alar  05/22/18 9:35 AM

## 2018-05-26 ENCOUNTER — Ambulatory Visit: Payer: Medicare Other | Admitting: Surgery

## 2018-05-26 ENCOUNTER — Other Ambulatory Visit: Payer: Self-pay

## 2018-05-26 ENCOUNTER — Encounter: Payer: Self-pay | Admitting: *Deleted

## 2018-05-26 ENCOUNTER — Encounter: Payer: Self-pay | Admitting: Surgery

## 2018-05-26 ENCOUNTER — Other Ambulatory Visit: Payer: Self-pay | Admitting: *Deleted

## 2018-05-26 VITALS — BP 147/81 | HR 71 | Temp 97.4°F | Resp 20 | Ht 73.0 in | Wt 225.0 lb

## 2018-05-26 DIAGNOSIS — I6521 Occlusion and stenosis of right carotid artery: Secondary | ICD-10-CM | POA: Diagnosis not present

## 2018-05-26 NOTE — H&P (View-Only) (Signed)
[facility]  Patient name: Darius Baxter MRN: [id number] DOB: [birth year] Sex: male   REASON FOR VISIT:    Carotid stenosis  HISOTRY OF PRESENT ILLNESS:    Darius Baxter is a 84 y.o. male who is status post left carotid endarterectomy with bovine pericardial patch angioplasty on 02/23/2016.  This was done for symptomatic left carotid stenosis.  The patient was having amaurosis fugax in the left eye prior to his operation.  Intraoperative findings included a 90% stenosis with a ruptured plaque at the bifurcation with soft plaque.  The patient's most recent surveillance ultrasound indicated progression of the stenosis on the right side, now greater than 80%.  Patient is asymptomatic.  Specifically, he denies numbness or weakness in either extremity.  He denies slurred speech.  He denies amaurosis fugax.  He is medically managed for hypertension, including an ACE inhibitor.  He is on a statin for hyperlipidemia.  He is a non-smoker.  PAST MEDICAL HISTORY:   Past Medical History:  Diagnosis Date  . Arthritis   . Carotid artery occlusion   . Chronic lower back pain   . Coronary artery disease    Mild at cardiac catheterization 2013  . Essential hypertension   . History of blood transfusion 2008; 2010  . Mitral valve prolapse    Mild mitral regurgitation  . Mixed hyperlipidemia   . Renal cancer [facility])    Status post right nephrectomy  . Seizures ([place])    Childhood-84 yrs old "passed out"  . Takotsubo cardiomyopathy    February 2013  . Ventricular ectopy    Bigeminy     FAMILY HISTORY:   Family History  Problem Relation Age of Onset  . Cancer Other   . Stroke Other   . Coronary artery disease Other   . Hypertension Other   . Hypertension Mother   . Peripheral Artery Disease Mother        had leg bypass, and died after surgery; believed to be from throwing a clot  . CVA Father   . Prostate cancer  Father   . Anesthesia problems Neg Hx   . Hypotension Neg Hx   . Malignant hyperthermia Neg Hx   . Pseudochol deficiency Neg Hx     SOCIAL HISTORY:   Social History   Tobacco Use  . Smoking status: Never Smoker  . Smokeless tobacco: Never Used  Substance Use Topics  . Alcohol use: No    Alcohol/week: 0.0 standard drinks     ALLERGIES:   Allergies  Allergen Reactions  . Aleve [Naproxen Sodium] Other (See Comments)    Patient has only 1 kidney and Doctor  Doesn't want him to take  . Naproxen Sodium Other (See Comments)    Patient has only 1 kidney and Doctor Doesn't want him to take     CURRENT MEDICATIONS:   Current Outpatient Medications  Medication Sig Dispense Refill  . alfuzosin (UROXATRAL) 10 MG 24 hr tablet Take 10 mg by mouth daily with breakfast.     . amLODipine (NORVASC) 5 MG tablet Take 5 mg by mouth daily.    Marland Kitchen aspirin EC 81 MG tablet Take 1 tablet (81 mg total) by mouth daily. 30 tablet 3  . atorvastatin (LIPITOR) 40 MG tablet Take 40 mg by mouth daily.    . carvedilol (COREG) 6.25 MG tablet Take 6.25 mg by mouth 2 (two) times daily with a meal.    . Cholecalciferol (VITAMIN D) 2000 units  tablet Take 2,000 Units by mouth daily.    . diphenhydramine-acetaminophen (TYLENOL PM) 25-500 MG TABS tablet Take 1 tablet by mouth at bedtime as needed.    . furosemide (LASIX) 20 MG tablet Take 20 mg by mouth daily.     Marland Kitchen HYDROcodone-acetaminophen (LORTAB) 7.5-500 MG per tablet Take 1 tablet by mouth 2 (two) times a week. Wed, and sun mornings     . lisinopril (PRINIVIL,ZESTRIL) 40 MG tablet Take 40 mg by mouth every evening.     No current facility-administered medications for this visit.     REVIEW OF SYSTEMS:   [X]  denotes positive finding, [ ]  denotes negative finding Cardiac  Comments:  Chest pain or chest pressure:    Shortness of breath upon exertion:    Short of breath when lying flat:    Irregular heart rhythm:        Vascular    Pain in calf, thigh,  or hip brought on by ambulation:    Pain in feet at night that wakes you up from your sleep:     Blood clot in your veins:    Leg swelling:         Pulmonary    Oxygen at home:    Productive cough:     Wheezing:         Neurologic    Sudden weakness in arms or legs:     Sudden numbness in arms or legs:     Sudden onset of difficulty speaking or slurred speech:    Temporary loss of vision in one eye:     Problems with dizziness:         Gastrointestinal    Blood in stool:     Vomited blood:         Genitourinary    Burning when urinating:     Blood in urine:        Psychiatric    Major depression:         Hematologic    Bleeding problems:    Problems with blood clotting too easily:        Skin    Rashes or ulcers:        Constitutional    Fever or chills:      PHYSICAL EXAM:   Vitals:   05/26/18 1404 05/26/18 1408  BP: 120/80 120/80  Pulse: (!) 52   Resp: 20   Temp: (!) 97.4 F (36.3 C)   TempSrc: Oral   SpO2: 96%   Weight: 225 lb (102.1 kg)   Height: 6\' 1"  (1.854 m)     GENERAL: The patient is a well-nourished male, in no acute distress. The vital signs are documented above. CARDIAC: There is a regular rate and rhythm.  PULMONARY: Non-labored respirations ABDOMEN: Soft and non-tender with normal pitched bowel sounds.  MUSCULOSKELETAL: There are no major deformities or cyanosis. NEUROLOGIC: No focal weakness or paresthesias are detected. SKIN: There are no ulcers or rashes noted. PSYCHIATRIC: The patient has a normal affect.  STUDIES:   I have ordered and reviewed his vascular study.  This shows 80-99% right carotid stenosis.  The bifurcation is at the hyoid bone.  End-diastolic velocity is 009.  There is 1-39% stenosis on the left.  MEDICAL ISSUES:   Asymptomatic right carotid stenosis: We discussed proceeding with right carotid endarterectomy.  I discussed the risk benefits the procedure include the risk of nerve injury, stroke, bleeding, and  possible recurrence.  All of his questions were answered.  His procedure is been scheduled for October 23    [name], MD [facility] [phone] Pager [phone]

## 2018-05-26 NOTE — Progress Notes (Signed)
Vascular and Vein Specialist of [facility]  Patient name: Darius Baxter MRN: [id number] DOB: 1934/06/20 Sex: male   REASON FOR VISIT:    Carotid stenosis  HISOTRY OF PRESENT ILLNESS:    Darius Baxter is a 84 y.o. male who is status post left carotid endarterectomy with bovine pericardial patch angioplasty on 02/23/2016.  This was done for symptomatic left carotid stenosis.  The patient was having amaurosis fugax in the left eye prior to his operation.  Intraoperative findings included a 90% stenosis with a ruptured plaque at the bifurcation with soft plaque.  The patient's most recent surveillance ultrasound indicated progression of the stenosis on the right side, now greater than 80%.  Patient is asymptomatic.  Specifically, he denies numbness or weakness in either extremity.  He denies slurred speech.  He denies amaurosis fugax.  He is medically managed for hypertension, including an ACE inhibitor.  He is on a statin for hyperlipidemia.  He is a non-smoker.  PAST MEDICAL HISTORY:   Past Medical History:  Diagnosis Date  . Arthritis   . Carotid artery occlusion   . Chronic lower back pain   . Coronary artery disease    Mild at cardiac catheterization 2013  . Essential hypertension   . History of blood transfusion 2008; 2010  . Mitral valve prolapse    Mild mitral regurgitation  . Mixed hyperlipidemia   . Renal cancer [facility])    Status post right nephrectomy  . Seizures ([place])    Childhood-84 yrs old "passed out"  . Takotsubo cardiomyopathy    February 2013  . Ventricular ectopy    Bigeminy     FAMILY HISTORY:   Family History  Problem Relation Age of Onset  . Cancer Other   . Stroke Other   . Coronary artery disease Other   . Hypertension Other   . Hypertension Mother   . Peripheral Artery Disease Mother        had leg bypass, and died after surgery; believed to be from throwing a clot  . CVA Father   . Prostate cancer  Father   . Anesthesia problems Neg Hx   . Hypotension Neg Hx   . Malignant hyperthermia Neg Hx   . Pseudochol deficiency Neg Hx     SOCIAL HISTORY:   Social History   Tobacco Use  . Smoking status: Never Smoker  . Smokeless tobacco: Never Used  Substance Use Topics  . Alcohol use: No    Alcohol/week: 0.0 standard drinks     ALLERGIES:   Allergies  Allergen Reactions  . Aleve [Naproxen Sodium] Other (See Comments)    Patient has only 1 kidney and Doctor  Doesn't want him to take  . Naproxen Sodium Other (See Comments)    Patient has only 1 kidney and Doctor Doesn't want him to take     CURRENT MEDICATIONS:   Current Outpatient Medications  Medication Sig Dispense Refill  . alfuzosin (UROXATRAL) 10 MG 24 hr tablet Take 10 mg by mouth daily with breakfast.     . amLODipine (NORVASC) 5 MG tablet Take 5 mg by mouth daily.    Marland Kitchen aspirin EC 81 MG tablet Take 1 tablet (81 mg total) by mouth daily. 30 tablet 3  . atorvastatin (LIPITOR) 40 MG tablet Take 40 mg by mouth daily.    . carvedilol (COREG) 6.25 MG tablet Take 6.25 mg by mouth 2 (two) times daily with a meal.    . Cholecalciferol (VITAMIN D) 2000 units  tablet Take 2,000 Units by mouth daily.    . diphenhydramine-acetaminophen (TYLENOL PM) 25-500 MG TABS tablet Take 1 tablet by mouth at bedtime as needed.    . furosemide (LASIX) 20 MG tablet Take 20 mg by mouth daily.     Marland Kitchen HYDROcodone-acetaminophen (LORTAB) 7.5-500 MG per tablet Take 1 tablet by mouth 2 (two) times a week. Wed, and sun mornings     . lisinopril (PRINIVIL,ZESTRIL) 40 MG tablet Take 40 mg by mouth every evening.     No current facility-administered medications for this visit.     REVIEW OF SYSTEMS:   [X]  denotes positive finding, [ ]  denotes negative finding Cardiac  Comments:  Chest pain or chest pressure:    Shortness of breath upon exertion:    Short of breath when lying flat:    Irregular heart rhythm:        Vascular    Pain in calf, thigh,  or hip brought on by ambulation:    Pain in feet at night that wakes you up from your sleep:     Blood clot in your veins:    Leg swelling:         Pulmonary    Oxygen at home:    Productive cough:     Wheezing:         Neurologic    Sudden weakness in arms or legs:     Sudden numbness in arms or legs:     Sudden onset of difficulty speaking or slurred speech:    Temporary loss of vision in one eye:     Problems with dizziness:         Gastrointestinal    Blood in stool:     Vomited blood:         Genitourinary    Burning when urinating:     Blood in urine:        Psychiatric    Major depression:         Hematologic    Bleeding problems:    Problems with blood clotting too easily:        Skin    Rashes or ulcers:        Constitutional    Fever or chills:      PHYSICAL EXAM:   Vitals:   05/26/18 1404 05/26/18 1408  BP: 120/80 120/80  Pulse: (!) 52   Resp: 20   Temp: (!) 97.4 F (36.3 C)   TempSrc: Oral   SpO2: 96%   Weight: 225 lb (102.1 kg)   Height: 6\' 1"  (1.854 m)     GENERAL: The patient is a well-nourished male, in no acute distress. The vital signs are documented above. CARDIAC: There is a regular rate and rhythm.  PULMONARY: Non-labored respirations ABDOMEN: Soft and non-tender with normal pitched bowel sounds.  MUSCULOSKELETAL: There are no major deformities or cyanosis. NEUROLOGIC: No focal weakness or paresthesias are detected. SKIN: There are no ulcers or rashes noted. PSYCHIATRIC: The patient has a normal affect.  STUDIES:   I have ordered and reviewed his vascular study.  This shows 80-99% right carotid stenosis.  The bifurcation is at the hyoid bone.  End-diastolic velocity is 078.  There is 1-39% stenosis on the left.  MEDICAL ISSUES:   Asymptomatic right carotid stenosis: We discussed proceeding with right carotid endarterectomy.  I discussed the risk benefits the procedure include the risk of nerve injury, stroke, bleeding, and  possible recurrence.  All of his questions were answered.  His procedure is been scheduled for October 23    [name], MD [facility] [phone] Pager [phone]

## 2018-05-27 ENCOUNTER — Encounter: Payer: Self-pay | Admitting: Cardiology

## 2018-06-04 NOTE — Pre-Procedure Instructions (Signed)
Keyston Ardolino Mckim  06/04/2018      [address], [place] [address] Phone: [phone] Fax: [phone]    Your procedure is scheduled on Wednesday, October 23rd.  Report to [facility] Admitting at 6:30 A.M.  Call this number if you have problems the morning of surgery:  [phone]   Remember:  Do not eat or drink after midnight.    Take these medicines the morning of surgery with A SIP OF WATER  amLODipine (NORVASC) carvedilol (COREG)  omeprazole (PRILOSEC) HYDROcodone-acetaminophen (LORTAB)  Artificial tears-if needed   7 days prior to surgery STOP taking any Aleve, Naproxen, Ibuprofen, Motrin, Advil, Goody's, BC's, all herbal medications, fish oil, and all vitamins.   Do not wear jewelry.  Do not wear lotions, powders, or colognes, or deodorant.  Men may shave face and neck.  Do not bring valuables to the hospital.  [facility] is not responsible for any belongings or valuables.  Eyeglasses, hearing aids, contacts, dentures or bridgework may not be worn into surgery.  Leave your suitcase in the car.  After surgery it may be brought to your room.  For patients admitted to the hospital, discharge time will be determined by your treatment team.  Patients discharged the day of surgery will not be allowed to drive home.   Special instructions:   [place]- Preparing For Surgery  Before surgery, you can play an important role. Because skin is not sterile, your skin needs to be as free of germs as possible. You can reduce the number of germs on your skin by washing with CHG (chlorahexidine gluconate) Soap before surgery.  CHG is an antiseptic cleaner which kills germs and bonds with the skin to continue killing germs even after washing.    Oral Hygiene is also important to reduce your risk of infection.  Remember - BRUSH YOUR TEETH THE MORNING OF SURGERY WITH YOUR REGULAR TOOTHPASTE  Please  do not use if you have an allergy to CHG or antibacterial soaps. If your skin becomes reddened/irritated stop using the CHG.  Do not shave (including legs and underarms) for at least 48 hours prior to first CHG shower. It is OK to shave your face.  Please follow these instructions carefully.   1. Shower the NIGHT BEFORE SURGERY and the MORNING OF SURGERY with CHG.   2. If you chose to wash your hair, wash your hair first as usual with your normal shampoo.  3. After you shampoo, rinse your hair and body thoroughly to remove the shampoo.  4. Use CHG as you would any other liquid soap. You can apply CHG directly to the skin and wash gently with a scrungie or a clean washcloth.   5. Apply the CHG Soap to your body ONLY FROM THE NECK DOWN.  Do not use on open wounds or open sores. Avoid contact with your eyes, ears, mouth and genitals (private parts). Wash Face and genitals (private parts)  with your normal soap.  6. Wash thoroughly, paying special attention to the area where your surgery will be performed.  7. Thoroughly rinse your body with warm water from the neck down.  8. DO NOT shower/wash with your normal soap after using and rinsing off the CHG Soap.  9. Pat yourself dry with a CLEAN TOWEL.  10. Wear CLEAN PAJAMAS to bed the night before surgery, wear comfortable clothes the morning of surgery  11. Place CLEAN  SHEETS on your bed the night of your first shower and DO NOT SLEEP WITH PETS.    Day of Surgery: Repeat steps as stated above.  Do not apply any deodorants/lotions.  Please wear clean clothes to the hospital/surgery center.   Remember to brush your teeth WITH YOUR REGULAR TOOTHPASTE.   Please read over the following fact sheets that you were given.

## 2018-06-05 ENCOUNTER — Encounter (HOSPITAL_COMMUNITY): Payer: Self-pay

## 2018-06-05 ENCOUNTER — Encounter (HOSPITAL_COMMUNITY)
Admission: RE | Admit: 2018-06-05 | Discharge: 2018-06-05 | Disposition: A | Discharge status: Home / Self Care | Payer: Medicare Other | Source: Ambulatory Visit | Attending: Surgery | Admitting: Surgery

## 2018-06-05 ENCOUNTER — Other Ambulatory Visit: Payer: Self-pay

## 2018-06-05 DIAGNOSIS — Z01812 Encounter for preprocedural laboratory examination: Secondary | ICD-10-CM | POA: Insufficient documentation

## 2018-06-05 HISTORY — DX: Nocturia: R35.1

## 2018-06-05 HISTORY — DX: Peripheral vascular disease, unspecified: I73.9

## 2018-06-05 LAB — URINALYSIS, ROUTINE W REFLEX MICROSCOPIC
Bilirubin Urine: NEGATIVE
Glucose, UA: NEGATIVE mg/dL
Ketones, ur: NEGATIVE mg/dL
Leukocytes, UA: NEGATIVE
Nitrite: NEGATIVE
Protein, ur: NEGATIVE mg/dL
Specific Gravity, Urine: 1.009 (ref 1.005–1.030)
pH: 5 (ref 5.0–8.0)

## 2018-06-05 LAB — CBC
HCT: 43.4 % (ref 39.0–52.0)
Hemoglobin: 13.9 g/dL (ref 13.0–17.0)
MCH: 30.3 pg (ref 26.0–34.0)
MCHC: 32 g/dL (ref 30.0–36.0)
MCV: 94.6 fL (ref 80.0–100.0)
Platelets: 145 10*3/uL — ABNORMAL LOW (ref 150–400)
RBC: 4.59 MIL/uL (ref 4.22–5.81)
RDW: 13.3 % (ref 11.5–15.5)
WBC: 7.3 10*3/uL (ref 4.0–10.5)
nRBC: 0 % (ref 0.0–0.2)

## 2018-06-05 LAB — COMPREHENSIVE METABOLIC PANEL
ALBUMIN: 4 g/dL (ref 3.5–5.0)
ALT: 18 U/L (ref 0–44)
ANION GAP: 10 (ref 5–15)
AST: 26 U/L (ref 15–41)
Alkaline Phosphatase: 72 U/L (ref 38–126)
BUN: 18 mg/dL (ref 8–23)
CO2: 24 mmol/L (ref 22–32)
Calcium: 10 mg/dL (ref 8.9–10.3)
Chloride: 105 mmol/L (ref 98–111)
Creatinine, Ser: 1.06 mg/dL (ref 0.61–1.24)
GFR calc Af Amer: >60 mL/min (ref >60)
GFR calc non Af Amer: >60 mL/min (ref >60)
GLUCOSE: 97 mg/dL (ref 70–99)
POTASSIUM: 4.7 mmol/L (ref 3.5–5.1)
SODIUM: 139 mmol/L (ref 135–145)
Total Bilirubin: 1.6 mg/dL — ABNORMAL HIGH (ref 0.3–1.2)
Total Protein: 6.8 g/dL (ref 6.5–8.1)

## 2018-06-05 LAB — APTT: aPTT: 32 seconds (ref 24–36)

## 2018-06-05 LAB — SURGICAL PCR SCREEN
MRSA, PCR: NEGATIVE
Staphylococcus aureus: NEGATIVE

## 2018-06-05 LAB — TYPE AND SCREEN
ABO/RH(D): A POS
Antibody Screen: NEGATIVE

## 2018-06-05 LAB — PROTIME-INR
INR: 1.05
Prothrombin Time: 13.6 seconds (ref 11.4–15.2)

## 2018-06-05 NOTE — Progress Notes (Signed)
Dr. Brabham notified of patient's abnormal UA 

## 2018-06-05 NOTE — Progress Notes (Signed)
PCP - Dr. Edrick Oh Cardiologist - Dr. Domenic Polite  Chest x-ray - patient denies EKG - 02/25/18 Stress Test - patient denies ECHO - 03/20/18 Cardiac Cath - 2013   Sleep Study - patient denies  Aspirin Instructions: patient instructed by Dr. Stephens [place] office to continue ASA and hold DOS  Anesthesia review: yes,, cardiac history  Patient denies shortness of breath, fever, cough and chest pain at PAT appointment   Patient verbalized understanding of instructions that were given to them at the PAT appointment. Patient was also instructed that they will need to review over the PAT instructions again at home before surgery.

## 2018-06-10 NOTE — Anesthesia Preprocedure Evaluation (Addendum)
Anesthesia Evaluation  Patient identified by MRN, date of birth, ID band Patient awake    Reviewed: Allergy & Precautions, NPO status , Patient's Chart, lab work & pertinent test results  History of Anesthesia Complications Negative for: history of anesthetic complications  Airway Mallampati: II  TM Distance: >3 FB Neck ROM: Full    Dental  (+) Edentulous Upper, Poor Dentition, Missing, Dental Advisory Given,    Pulmonary neg pulmonary ROS,    breath sounds clear to auscultation       Cardiovascular hypertension, Pt. on medications and Pt. on home beta blockers + CAD, + Past MI and + Peripheral Vascular Disease  + Valvular Problems/Murmurs MVP  Rhythm:Regular Rate:Normal  Hx of MVP, mild CAD on cath 2013, h/o left cardotid endarterectomy   Neuro/Psych negative neurological ROS     GI/Hepatic Neg liver ROS, GERD  Medicated,  Endo/Other  negative endocrine ROS  Renal/GU Renal InsufficiencyRenal diseaseHx of right nephrectomy     Musculoskeletal  (+) Arthritis , Osteoarthritis,    Abdominal   Peds  Hematology negative hematology ROS (+)   Anesthesia Other Findings Day of surgery medications reviewed with the patient.  Reproductive/Obstetrics                           Anesthesia Physical Anesthesia Plan  ASA: III  Anesthesia Plan: General   Post-op Pain Management:    Induction: Intravenous  PONV Risk Score and Plan: 2 and Ondansetron, Dexamethasone and Treatment may vary due to age or medical condition  Airway Management Planned: Oral ETT  Additional Equipment: Arterial line  Intra-op Plan:   Post-operative Plan: Extubation in OR  Informed Consent: I have reviewed the patients History and Physical, chart, labs and discussed the procedure including the risks, benefits and alternatives for the proposed anesthesia with the patient or authorized representative who has indicated  his/her understanding and acceptance.   Dental advisory given  Plan Discussed with: CRNA and Surgeon  Anesthesia Plan Comments:        Anesthesia Quick Evaluation

## 2018-06-11 ENCOUNTER — Inpatient Hospital Stay (HOSPITAL_COMMUNITY): Payer: Medicare Other | Admitting: Anesthesiology

## 2018-06-11 ENCOUNTER — Encounter (HOSPITAL_COMMUNITY): Payer: Self-pay | Admitting: Certified Registered"

## 2018-06-11 ENCOUNTER — Other Ambulatory Visit: Payer: Self-pay

## 2018-06-11 ENCOUNTER — Inpatient Hospital Stay (HOSPITAL_COMMUNITY)
Admission: RE | Admit: 2018-06-11 | Discharge: 2018-06-12 | DRG: 027 | Disposition: A | Discharge status: Home / Self Care | Payer: Medicare Other | Source: Ambulatory Visit | Attending: Surgery | Admitting: Surgery

## 2018-06-11 ENCOUNTER — Inpatient Hospital Stay (HOSPITAL_COMMUNITY): Payer: Medicare Other | Admitting: Physician Assistant

## 2018-06-11 ENCOUNTER — Encounter (HOSPITAL_COMMUNITY)
Admission: RE | Disposition: A | Discharge status: Home / Self Care | Payer: Self-pay | Source: Ambulatory Visit | Attending: Surgery

## 2018-06-11 DIAGNOSIS — Z79891 Long term (current) use of opiate analgesic: Secondary | ICD-10-CM | POA: Diagnosis not present

## 2018-06-11 DIAGNOSIS — G8929 Other chronic pain: Secondary | ICD-10-CM | POA: Diagnosis present

## 2018-06-11 DIAGNOSIS — Z905 Acquired absence of kidney: Secondary | ICD-10-CM | POA: Diagnosis not present

## 2018-06-11 DIAGNOSIS — I341 Nonrheumatic mitral (valve) prolapse: Secondary | ICD-10-CM | POA: Diagnosis present

## 2018-06-11 DIAGNOSIS — Z886 Allergy status to analgesic agent status: Secondary | ICD-10-CM | POA: Diagnosis not present

## 2018-06-11 DIAGNOSIS — R402253 Coma scale, best verbal response, oriented, at hospital admission: Secondary | ICD-10-CM | POA: Diagnosis present

## 2018-06-11 DIAGNOSIS — M1990 Unspecified osteoarthritis, unspecified site: Secondary | ICD-10-CM | POA: Diagnosis present

## 2018-06-11 DIAGNOSIS — I252 Old myocardial infarction: Secondary | ICD-10-CM

## 2018-06-11 DIAGNOSIS — I2510 Atherosclerotic heart disease of native coronary artery without angina pectoris: Secondary | ICD-10-CM | POA: Diagnosis present

## 2018-06-11 DIAGNOSIS — Z8679 Personal history of other diseases of the circulatory system: Secondary | ICD-10-CM

## 2018-06-11 DIAGNOSIS — I6521 Occlusion and stenosis of right carotid artery: Principal | ICD-10-CM | POA: Diagnosis present

## 2018-06-11 DIAGNOSIS — M545 Low back pain: Secondary | ICD-10-CM | POA: Diagnosis present

## 2018-06-11 DIAGNOSIS — Z7984 Long term (current) use of oral hypoglycemic drugs: Secondary | ICD-10-CM | POA: Diagnosis not present

## 2018-06-11 DIAGNOSIS — Z79899 Other long term (current) drug therapy: Secondary | ICD-10-CM | POA: Diagnosis not present

## 2018-06-11 DIAGNOSIS — R402143 Coma scale, eyes open, spontaneous, at hospital admission: Secondary | ICD-10-CM | POA: Diagnosis present

## 2018-06-11 DIAGNOSIS — Z885 Allergy status to narcotic agent status: Secondary | ICD-10-CM

## 2018-06-11 DIAGNOSIS — Z85528 Personal history of other malignant neoplasm of kidney: Secondary | ICD-10-CM | POA: Diagnosis not present

## 2018-06-11 DIAGNOSIS — R402363 Coma scale, best motor response, obeys commands, at hospital admission: Secondary | ICD-10-CM | POA: Diagnosis present

## 2018-06-11 DIAGNOSIS — I10 Essential (primary) hypertension: Secondary | ICD-10-CM | POA: Diagnosis present

## 2018-06-11 DIAGNOSIS — E782 Mixed hyperlipidemia: Secondary | ICD-10-CM | POA: Diagnosis present

## 2018-06-11 HISTORY — PX: PATCH ANGIOPLASTY: SHX6230

## 2018-06-11 HISTORY — PX: ENDARTERECTOMY: SHX5162

## 2018-06-11 HISTORY — PX: CAROTID ENDARTERECTOMY: SUR193

## 2018-06-11 LAB — POCT ACTIVATED CLOTTING TIME: Activated Clotting Time: 224 seconds

## 2018-06-11 SURGERY — ENDARTERECTOMY, CAROTID
Anesthesia: General | Site: Neck | Laterality: Right

## 2018-06-11 MED ORDER — GLYCOPYRROLATE PF 0.2 MG/ML IJ SOSY
PREFILLED_SYRINGE | INTRAMUSCULAR | Status: DC | PRN
  Administered 2018-06-11 (×2): .2 mg via INTRAVENOUS

## 2018-06-11 MED ORDER — POTASSIUM CHLORIDE CRYS ER 20 MEQ PO TBCR: 20.0000 meq | EXTENDED_RELEASE_TABLET | Freq: Every day | ORAL | Status: DC | PRN

## 2018-06-11 MED ORDER — ALUM & MAG HYDROXIDE-SIMETH 200-200-20 MG/5ML PO SUSP: 15.0000 mL | ORAL | Status: DC | PRN

## 2018-06-11 MED ORDER — EPHEDRINE SULFATE-NACL 50-0.9 MG/10ML-% IV SOSY
PREFILLED_SYRINGE | INTRAVENOUS | Status: DC | PRN
  Administered 2018-06-11: 10 mg via INTRAVENOUS

## 2018-06-11 MED ORDER — BISACODYL 10 MG RE SUPP: 10.0000 mg | Freq: Every day | RECTAL | Status: DC | PRN

## 2018-06-11 MED ORDER — FENTANYL CITRATE (PF) 100 MCG/2ML IJ SOLN
INTRAMUSCULAR | Status: DC | PRN
  Administered 2018-06-11: 100 ug via INTRAVENOUS
  Administered 2018-06-11: 25 ug via INTRAVENOUS

## 2018-06-11 MED ORDER — DEXAMETHASONE SODIUM PHOSPHATE 10 MG/ML IJ SOLN
INTRAMUSCULAR | Status: AC
  Filled 2018-06-11: qty 1

## 2018-06-11 MED ORDER — PHENOL 1.4 % MT LIQD: 1.0000 | OROMUCOSAL | Status: DC | PRN

## 2018-06-11 MED ORDER — HEPARIN SODIUM (PORCINE) 1000 UNIT/ML IJ SOLN
INTRAMUSCULAR | Status: DC | PRN
  Administered 2018-06-11: 1000 [IU] via INTRAVENOUS
  Administered 2018-06-11: 10000 [IU] via INTRAVENOUS

## 2018-06-11 MED ORDER — HEMOSTATIC AGENTS (NO CHARGE) OPTIME
TOPICAL | Status: DC | PRN
  Administered 2018-06-11: 1 via TOPICAL

## 2018-06-11 MED ORDER — CHLORHEXIDINE GLUCONATE 4 % EX LIQD: 60.0000 mL | Freq: Once | CUTANEOUS | Status: DC

## 2018-06-11 MED ORDER — ASPIRIN EC 81 MG PO TBEC
81.0000 mg | DELAYED_RELEASE_TABLET | Freq: Every day | ORAL | Status: DC
  Administered 2018-06-12: 81 mg via ORAL
  Filled 2018-06-11: qty 1

## 2018-06-11 MED ORDER — CEFAZOLIN SODIUM-DEXTROSE 2-4 GM/100ML-% IV SOLN
INTRAVENOUS | Status: AC
  Filled 2018-06-11: qty 100

## 2018-06-11 MED ORDER — OXYCODONE-ACETAMINOPHEN 5-325 MG PO TABS: 1.0000 | ORAL_TABLET | ORAL | Status: DC | PRN

## 2018-06-11 MED ORDER — FENTANYL CITRATE (PF) 100 MCG/2ML IJ SOLN
25.0000 ug | INTRAMUSCULAR | Status: DC | PRN
  Administered 2018-06-11 (×2): 25 ug via INTRAVENOUS

## 2018-06-11 MED ORDER — ACETAMINOPHEN 500 MG PO TABS
500.0000 mg | ORAL_TABLET | Freq: Every evening | ORAL | Status: DC | PRN
  Administered 2018-06-11: 500 mg via ORAL
  Filled 2018-06-11: qty 1

## 2018-06-11 MED ORDER — GLYCOPYRROLATE PF 0.2 MG/ML IJ SOSY
PREFILLED_SYRINGE | INTRAMUSCULAR | Status: AC
  Filled 2018-06-11: qty 2

## 2018-06-11 MED ORDER — PANTOPRAZOLE SODIUM 40 MG PO TBEC
40.0000 mg | DELAYED_RELEASE_TABLET | Freq: Every day | ORAL | Status: DC
  Administered 2018-06-12: 40 mg via ORAL
  Filled 2018-06-11: qty 1

## 2018-06-11 MED ORDER — DOCUSATE SODIUM 100 MG PO CAPS
100.0000 mg | ORAL_CAPSULE | Freq: Every day | ORAL | Status: DC
  Administered 2018-06-12: 100 mg via ORAL
  Filled 2018-06-11: qty 1

## 2018-06-11 MED ORDER — PROTAMINE SULFATE 10 MG/ML IV SOLN
INTRAVENOUS | Status: DC | PRN
  Administered 2018-06-11: 50 mg via INTRAVENOUS

## 2018-06-11 MED ORDER — LIDOCAINE 2% (20 MG/ML) 5 ML SYRINGE
INTRAMUSCULAR | Status: AC
  Filled 2018-06-11: qty 5

## 2018-06-11 MED ORDER — LIDOCAINE HCL (PF) 1 % IJ SOLN
INTRAMUSCULAR | Status: AC
  Filled 2018-06-11: qty 30

## 2018-06-11 MED ORDER — ACETAMINOPHEN 10 MG/ML IV SOLN: 1000.0000 mg | Freq: Once | INTRAVENOUS | Status: DC | PRN

## 2018-06-11 MED ORDER — LIDOCAINE 2% (20 MG/ML) 5 ML SYRINGE
INTRAMUSCULAR | Status: DC | PRN
  Administered 2018-06-11: 100 mg via INTRAVENOUS

## 2018-06-11 MED ORDER — POLYETHYLENE GLYCOL 3350 17 G PO PACK: 17.0000 g | PACK | Freq: Every day | ORAL | Status: DC | PRN

## 2018-06-11 MED ORDER — CEFAZOLIN SODIUM-DEXTROSE 2-4 GM/100ML-% IV SOLN
2.0000 g | INTRAVENOUS | Status: AC
  Administered 2018-06-11: 2 g via INTRAVENOUS

## 2018-06-11 MED ORDER — FENTANYL CITRATE (PF) 250 MCG/5ML IJ SOLN
INTRAMUSCULAR | Status: AC
  Filled 2018-06-11: qty 5

## 2018-06-11 MED ORDER — METOPROLOL TARTRATE 5 MG/5ML IV SOLN: 2.0000 mg | INTRAVENOUS | Status: DC | PRN

## 2018-06-11 MED ORDER — PROPOFOL 10 MG/ML IV BOLUS
INTRAVENOUS | Status: DC | PRN
  Administered 2018-06-11: 200 mg via INTRAVENOUS

## 2018-06-11 MED ORDER — LABETALOL HCL 5 MG/ML IV SOLN: 10.0000 mg | INTRAVENOUS | Status: DC | PRN

## 2018-06-11 MED ORDER — ATORVASTATIN CALCIUM 40 MG PO TABS: 40.0000 mg | ORAL_TABLET | Freq: Every day | ORAL | Status: DC

## 2018-06-11 MED ORDER — ALFUZOSIN HCL ER 10 MG PO TB24
10.0000 mg | ORAL_TABLET | Freq: Every day | ORAL | Status: DC
  Administered 2018-06-12: 10 mg via ORAL
  Filled 2018-06-11: qty 1

## 2018-06-11 MED ORDER — DEXAMETHASONE SODIUM PHOSPHATE 10 MG/ML IJ SOLN
INTRAMUSCULAR | Status: DC | PRN
  Administered 2018-06-11: 10 mg via INTRAVENOUS

## 2018-06-11 MED ORDER — ONDANSETRON HCL 4 MG/2ML IJ SOLN: 4.0000 mg | Freq: Four times a day (QID) | INTRAMUSCULAR | Status: DC | PRN

## 2018-06-11 MED ORDER — SODIUM CHLORIDE 0.9 % IV SOLN
INTRAVENOUS | Status: DC | PRN
  Administered 2018-06-11: 25 ug/min via INTRAVENOUS
  Administered 2018-06-11: 10:00:00 via INTRAVENOUS

## 2018-06-11 MED ORDER — MAGNESIUM SULFATE 2 GM/50ML IV SOLN: 2.0000 g | Freq: Every day | INTRAVENOUS | Status: DC | PRN

## 2018-06-11 MED ORDER — VITAMIN D 1000 UNITS PO TABS
2000.0000 [IU] | ORAL_TABLET | Freq: Every day | ORAL | Status: DC
  Administered 2018-06-12: 2000 [IU] via ORAL
  Filled 2018-06-11: qty 2

## 2018-06-11 MED ORDER — ONDANSETRON HCL 4 MG/2ML IJ SOLN
INTRAMUSCULAR | Status: AC
  Filled 2018-06-11: qty 2

## 2018-06-11 MED ORDER — SODIUM CHLORIDE 0.9 % IV SOLN
INTRAVENOUS | Status: DC | PRN
  Administered 2018-06-11: 500 mL

## 2018-06-11 MED ORDER — PROTAMINE SULFATE 10 MG/ML IV SOLN
INTRAVENOUS | Status: AC
  Filled 2018-06-11: qty 5

## 2018-06-11 MED ORDER — SODIUM CHLORIDE 0.9 % IV SOLN
INTRAVENOUS | Status: DC | PRN
  Administered 2018-06-11: 09:00:00 via INTRAVENOUS

## 2018-06-11 MED ORDER — PROMETHAZINE HCL 25 MG/ML IJ SOLN: 6.2500 mg | INTRAMUSCULAR | Status: DC | PRN

## 2018-06-11 MED ORDER — LISINOPRIL 40 MG PO TABS
40.0000 mg | ORAL_TABLET | Freq: Every evening | ORAL | Status: DC
  Administered 2018-06-11: 40 mg via ORAL
  Filled 2018-06-11 (×2): qty 1

## 2018-06-11 MED ORDER — EPHEDRINE 5 MG/ML INJ
INTRAVENOUS | Status: AC
  Filled 2018-06-11: qty 10

## 2018-06-11 MED ORDER — SODIUM CHLORIDE 0.9 % IV SOLN
INTRAVENOUS | Status: AC
  Filled 2018-06-11: qty 1.2

## 2018-06-11 MED ORDER — CARVEDILOL 6.25 MG PO TABS
6.2500 mg | ORAL_TABLET | Freq: Two times a day (BID) | ORAL | Status: DC
  Administered 2018-06-11 – 2018-06-12 (×2): 6.25 mg via ORAL
  Filled 2018-06-11 (×2): qty 1

## 2018-06-11 MED ORDER — SUGAMMADEX SODIUM 200 MG/2ML IV SOLN
INTRAVENOUS | Status: DC | PRN
  Administered 2018-06-11: 200 mg via INTRAVENOUS

## 2018-06-11 MED ORDER — SODIUM CHLORIDE 0.9 % IV SOLN: 500.0000 mL | Freq: Once | INTRAVENOUS | Status: DC | PRN

## 2018-06-11 MED ORDER — FUROSEMIDE 20 MG PO TABS
20.0000 mg | ORAL_TABLET | Freq: Every day | ORAL | Status: DC
  Administered 2018-06-11 – 2018-06-12 (×2): 20 mg via ORAL
  Filled 2018-06-11 (×2): qty 1

## 2018-06-11 MED ORDER — FENTANYL CITRATE (PF) 100 MCG/2ML IJ SOLN
INTRAMUSCULAR | Status: AC
  Filled 2018-06-11: qty 2

## 2018-06-11 MED ORDER — GUAIFENESIN-DM 100-10 MG/5ML PO SYRP: 15.0000 mL | ORAL_SOLUTION | ORAL | Status: DC | PRN

## 2018-06-11 MED ORDER — ROCURONIUM BROMIDE 50 MG/5ML IV SOSY
PREFILLED_SYRINGE | INTRAVENOUS | Status: AC
  Filled 2018-06-11: qty 5

## 2018-06-11 MED ORDER — ROCURONIUM BROMIDE 50 MG/5ML IV SOSY
PREFILLED_SYRINGE | INTRAVENOUS | Status: DC | PRN
  Administered 2018-06-11: 50 mg via INTRAVENOUS

## 2018-06-11 MED ORDER — PHENYLEPHRINE 40 MCG/ML (10ML) SYRINGE FOR IV PUSH (FOR BLOOD PRESSURE SUPPORT)
PREFILLED_SYRINGE | INTRAVENOUS | Status: AC
  Filled 2018-06-11: qty 10

## 2018-06-11 MED ORDER — ONDANSETRON HCL 4 MG/2ML IJ SOLN
INTRAMUSCULAR | Status: DC | PRN
  Administered 2018-06-11: 4 mg via INTRAVENOUS

## 2018-06-11 MED ORDER — SODIUM CHLORIDE 0.9 % IV SOLN
0.0500 ug/kg/min | INTRAVENOUS | Status: AC
  Administered 2018-06-11: 0.05 ug/kg/min via INTRAVENOUS
  Filled 2018-06-11: qty 4000

## 2018-06-11 MED ORDER — PROPOFOL 10 MG/ML IV BOLUS
INTRAVENOUS | Status: AC
  Filled 2018-06-11: qty 20

## 2018-06-11 MED ORDER — HYDRALAZINE HCL 20 MG/ML IJ SOLN: 5.0000 mg | INTRAMUSCULAR | Status: DC | PRN

## 2018-06-11 MED ORDER — 0.9 % SODIUM CHLORIDE (POUR BTL) OPTIME
TOPICAL | Status: DC | PRN
  Administered 2018-06-11: 2000 mL

## 2018-06-11 MED ORDER — ACETAMINOPHEN 325 MG RE SUPP: 325.0000 mg | RECTAL | Status: DC | PRN

## 2018-06-11 MED ORDER — DIPHENHYDRAMINE-APAP (SLEEP) 25-500 MG PO TABS: 2.0000 | ORAL_TABLET | Freq: Every day | ORAL | Status: DC

## 2018-06-11 MED ORDER — DIPHENHYDRAMINE HCL 25 MG PO CAPS
25.0000 mg | ORAL_CAPSULE | Freq: Every evening | ORAL | Status: DC | PRN
  Administered 2018-06-11: 25 mg via ORAL
  Filled 2018-06-11: qty 1

## 2018-06-11 MED ORDER — AMLODIPINE BESYLATE 5 MG PO TABS
5.0000 mg | ORAL_TABLET | Freq: Every day | ORAL | Status: DC
  Administered 2018-06-12: 5 mg via ORAL
  Filled 2018-06-11: qty 1

## 2018-06-11 MED ORDER — ATORVASTATIN CALCIUM 40 MG PO TABS
40.0000 mg | ORAL_TABLET | Freq: Every day | ORAL | Status: DC
  Administered 2018-06-11 – 2018-06-12 (×2): 40 mg via ORAL
  Filled 2018-06-11 (×2): qty 1

## 2018-06-11 MED ORDER — PHENYLEPHRINE 40 MCG/ML (10ML) SYRINGE FOR IV PUSH (FOR BLOOD PRESSURE SUPPORT)
PREFILLED_SYRINGE | INTRAVENOUS | Status: DC | PRN
  Administered 2018-06-11 (×2): 120 ug via INTRAVENOUS
  Administered 2018-06-11: 160 ug via INTRAVENOUS

## 2018-06-11 MED ORDER — SODIUM CHLORIDE 0.9 % IV SOLN
INTRAVENOUS | Status: DC
  Administered 2018-06-11: 17:00:00 via INTRAVENOUS

## 2018-06-11 MED ORDER — ACETAMINOPHEN 325 MG PO TABS: 325.0000 mg | ORAL_TABLET | ORAL | Status: DC | PRN

## 2018-06-11 MED ORDER — 0.9 % SODIUM CHLORIDE (POUR BTL) OPTIME
TOPICAL | Status: DC | PRN
  Administered 2018-06-11: 1000 mL

## 2018-06-11 MED ORDER — MORPHINE SULFATE (PF) 2 MG/ML IV SOLN: 2.0000 mg | INTRAVENOUS | Status: DC | PRN

## 2018-06-11 MED ORDER — SODIUM CHLORIDE 0.9 % IV SOLN
INTRAVENOUS | Status: DC
  Administered 2018-06-11: 25 mL/h via INTRAVENOUS

## 2018-06-11 MED ORDER — CEFAZOLIN SODIUM-DEXTROSE 2-4 GM/100ML-% IV SOLN
2.0000 g | Freq: Three times a day (TID) | INTRAVENOUS | Status: AC
  Administered 2018-06-11 – 2018-06-12 (×2): 2 g via INTRAVENOUS
  Filled 2018-06-11 (×2): qty 100

## 2018-06-11 SURGICAL SUPPLY — 46 items
CANISTER SUCT 3000ML PPV (MISCELLANEOUS) ×3 IMPLANT
CATH ROBINSON RED A/P 18FR (CATHETERS) ×3 IMPLANT
CATH SUCT 10FR WHISTLE TIP (CATHETERS) ×3 IMPLANT
CLIP VESOCCLUDE MED 6/CT (CLIP) ×3 IMPLANT
CLIP VESOCCLUDE SM WIDE 6/CT (CLIP) ×3 IMPLANT
COVER PROBE W GEL 5X96 (DRAPES) ×3 IMPLANT
COVER WAND RF STERILE (DRAPES) ×3 IMPLANT
CRADLE DONUT ADULT HEAD (MISCELLANEOUS) ×3 IMPLANT
DERMABOND ADVANCED (GAUZE/BANDAGES/DRESSINGS) ×2
DERMABOND ADVANCED .7 DNX12 (GAUZE/BANDAGES/DRESSINGS) ×1 IMPLANT
DRAIN CHANNEL 15F RND FF W/TCR (WOUND CARE) IMPLANT
ELECT REM PT RETURN 9FT ADLT (ELECTROSURGICAL) ×3
ELECTRODE REM PT RTRN 9FT ADLT (ELECTROSURGICAL) ×1 IMPLANT
EVACUATOR SILICONE 100CC (DRAIN) IMPLANT
GLOVE BIOGEL PI IND STRL 7.5 (GLOVE) ×1 IMPLANT
GLOVE BIOGEL PI INDICATOR 7.5 (GLOVE) ×2
GLOVE SURG SS PI 7.5 STRL IVOR (GLOVE) ×3 IMPLANT
GOWN STRL REUS W/ TWL LRG LVL3 (GOWN DISPOSABLE) ×3 IMPLANT
GOWN STRL REUS W/ TWL XL LVL3 (GOWN DISPOSABLE) ×1 IMPLANT
GOWN STRL REUS W/TWL LRG LVL3 (GOWN DISPOSABLE) ×6
GOWN STRL REUS W/TWL XL LVL3 (GOWN DISPOSABLE) ×2
HEMOSTAT SNOW SURGICEL 2X4 (HEMOSTASIS) ×3 IMPLANT
INSERT FOGARTY SM (MISCELLANEOUS) IMPLANT
KIT BASIN OR (CUSTOM PROCEDURE TRAY) ×3 IMPLANT
KIT SHUNT ARGYLE CAROTID ART 6 (VASCULAR PRODUCTS) IMPLANT
KIT TURNOVER KIT B (KITS) ×3 IMPLANT
NEEDLE HYPO 25GX1X1/2 BEV (NEEDLE) IMPLANT
NS IRRIG 1000ML POUR BTL (IV SOLUTION) ×9 IMPLANT
PACK CAROTID (CUSTOM PROCEDURE TRAY) ×3 IMPLANT
PAD ARMBOARD 7.5X6 YLW CONV (MISCELLANEOUS) ×6 IMPLANT
PATCH VASC XENOSURE 1CMX6CM (Vascular Products) ×2 IMPLANT
PATCH VASC XENOSURE 1X6 (Vascular Products) ×1 IMPLANT
SHUNT CAROTID BYPASS 10 (VASCULAR PRODUCTS) IMPLANT
SHUNT CAROTID BYPASS 12FRX15.5 (VASCULAR PRODUCTS) IMPLANT
SUT ETHILON 3 0 PS 1 (SUTURE) IMPLANT
SUT PROLENE 6 0 BV (SUTURE) ×6 IMPLANT
SUT PROLENE 7 0 BV 1 (SUTURE) IMPLANT
SUT PROLENE 7 0 BV1 MDA (SUTURE) ×3 IMPLANT
SUT SILK 3 0 (SUTURE)
SUT SILK 3-0 18XBRD TIE 12 (SUTURE) IMPLANT
SUT VIC AB 3-0 SH 27 (SUTURE) ×4
SUT VIC AB 3-0 SH 27X BRD (SUTURE) ×2 IMPLANT
SUT VICRYL 4-0 PS2 18IN ABS (SUTURE) ×3 IMPLANT
SYR CONTROL 10ML LL (SYRINGE) IMPLANT
TOWEL GREEN STERILE (TOWEL DISPOSABLE) ×3 IMPLANT
WATER STERILE IRR 1000ML POUR (IV SOLUTION) ×3 IMPLANT

## 2018-06-11 NOTE — Interval H&P Note (Signed)
History and Physical Interval Note:  06/11/2018 8:14 AM  Darius Baxter  has presented today for surgery, with the diagnosis of RIGHT CAROTID STENOSIS  The various methods of treatment have been discussed with the patient and family. After consideration of risks, benefits and other options for treatment, the patient has consented to  Procedure(s): ENDARTERECTOMY CAROTID RIGHT (Right) as a surgical intervention .  The patient's history has been reviewed, patient examined, no change in status, stable for surgery.  I have reviewed the patient's chart and labs.  Questions were answered to the patient's satisfaction.     [name]

## 2018-06-11 NOTE — Progress Notes (Signed)
Diminished hearing both ears.  No hearing aids

## 2018-06-11 NOTE — Op Note (Signed)
Patient name: Darius Baxter MRN: [id number] DOB: [birth year] Sex: male  06/11/2018 Pre-operative Diagnosis: Asymptomatic   right carotid stenosis Post-operative diagnosis:  Same Surgeon:  [name] Assistants:  Leontine Locket Procedure:    right carotid Endarterectomy with bovine pericardial patch angioplasty Anesthesia:  General Blood Loss:  100 Specimens:  none  Findings:  85 %stenosis; Thrombus:  No acute  Indications:  84 year old s/p left CEA for symptomatic stenosis.  Surveillance revealed >80 % right sided stenosis.  He is asymptomatic.  He is here for CEA  Procedure:  The patient was identified in the holding area and taken to [place] [address]  The patient was then placed supine on the table.   General endotrachial anesthesia was administered.  The patient was prepped and draped in the usual sterile fashion.  A time out was called and antibiotics were administered.  The incision was made along the anterior border of the right sternocleidomastoid muscle.  Cautery was used to dissect through the subcutaneous tissue.  The platysma muscle was divided with cautery.  The internal jugular vein was exposed along its anterior medial border.  The common facial vein was exposed and then divided between 2-0 silk ties and metal clips.  The common carotid artery was then circumferentially exposed and encircled with an umbilical tape.  The vagus nerve was identified and protected.  Next sharp dissection was used to expose the external carotid artery and the superior thyroid artery.  The were encircled with a blue vessel loop and a 2-0 silk tie respectively.  Finally, the internal carotid was carefully dissected free.  An umbilical tape was placed around the internal carotid artery distal to the diseased segment.  The hypoglossal nerve was visualized throughout and protected.  The patient was given systemic heparinization.  A bovine carotid patch was selected and prepared on the back table.  A 10  french shunt was also prepared.  After blood pressure readings were appropriate and the heparin had been given time to circulate, the internal carotid artery was occluded with a baby Gregory clamp.  The external and common carotid arteries were then occluded with vascular clamps and the 2-0 tie tightened on the superior thyroid artery.  A #11 blade was used to make an arteriotomy in the common carotid artery.  This was extended with Potts scissors along the anterior and lateral border of the common and internal carotid artery.  Approximately 85% stenosis was identified.  There was no thrombus identified.  The 10 french shunt was then placed.  A kleiner kuntz elevator was used to perform endarterectomy.  An eversion endarterectomy was performed in the external carotid artery.  A good distal endpoint was obtained in the internal carotid artery.  The specimen was removed and sent to pathology.  Heparinized saline was used to irrigate the endarterectomized field.  All potential embolic debris was removed.  Bovine pericardial patch angioplasty was then performed using a running 6-0 Prolene. Just prior to completion of the repair, the shunt was removed. The common internal and external carotid arteries were all appropriately flushed. The artery was again irrigated with heparin saline.  The anastomosis was then secured. The clamp was first released on the external carotid artery followed by the common carotid artery approximately 30 seconds later, bloodflow was reestablish through the internal carotid artery.  Next, a hand-held  Doppler was used to evaluate the signals in the common, external, and internal  carotid arteries, all of which had appropriate signals.  I then administered  50 mg protamine. The wound was then irrigated.  After hemostasis was achieved, the carotid sheath was reapproximated with 3-0 Vicryl. The  platysma muscle was reapproximated with running 3-0 Vicryl. The skin  was closed with 4-0 Vicryl.  Dermabond was placed on the skin. The  patient was then successfully extubated. His neurologic exam was  similar to his preprocedural exam. The patient was then taken to recovery room  in stable condition. There were no complications.     Disposition:  To PACU in stable condition.  Relevant Operative Details:  Low bifurcation.  Ulcerated plaque with mobile debris.  Shunt utilized  V. [name], M.D. [facility] Office: [phone] Pager:  [phone]

## 2018-06-11 NOTE — Discharge Instructions (Signed)
° °  [facility] ° °Discharge Instructions °  °Carotid Endarterectomy (CEA) ° °Please refer to the following instructions for your post-procedure care. Your surgeon or physician assistant will discuss any changes with you. ° °Activity ° °You are encouraged to walk as much as you can. You can slowly return to normal activities but must avoid strenuous activity and heavy lifting until your doctor tell you it's okay. Avoid activities such as vacuuming or swinging a golf club. You can drive after one week if you are comfortable and you are no longer taking prescription pain medications. It is normal to feel tired for serval weeks after your surgery. It is also normal to have difficulty with sleep habits, eating, and bowel movements after surgery. These will go away with time. ° °Bathing/Showering ° °Shower daily after you go home. Do not soak in a bathtub, hot tub, or swim until the incision heals completely. ° °Incision Care ° °Shower every day. Clean your incision with mild soap and water. Pat the area dry with a clean towel. You do not need a bandage unless otherwise instructed. Do not apply any ointments or creams to your incision. You may have skin glue on your incision. Do not peel it off. It will come off on its own in about one week. Your incision may feel thickened and raised for several weeks after your surgery. This is normal and the skin will soften over time.  ° °For Men Only: It's okay to shave around the incision but do not shave the incision itself for 2 weeks. It is common to have numbness under your chin that could last for several months. ° °Diet ° °Resume your normal diet. There are no special food restrictions following this procedure. A low fat/low cholesterol diet is recommended for all patients with vascular disease. In order to heal from your surgery, it is CRITICAL to get adequate nutrition. Your body requires vitamins, minerals, and protein. Vegetables are the  best source of vitamins and minerals. Vegetables also provide the perfect balance of protein. Processed food has little nutritional value, so try to avoid this. ° °Medications ° °Resume taking all of your medications unless your doctor or physician assistant tells you not to. If your incision is causing pain, you may take over-the- counter pain relievers such as acetaminophen (Tylenol). If you were prescribed a stronger pain medication, please be aware these medications can cause nausea and constipation. Prevent nausea by taking the medication with a snack or meal. Avoid constipation by drinking plenty of fluids and eating foods with a high amount of fiber, such as fruits, vegetables, and grains.  °Do not take Tylenol if you are taking prescription pain medications. ° °Follow Up ° °Our office will schedule a follow up appointment 2-3 weeks following discharge. ° °Please call us immediately for any of the following conditions ° °Increased pain, redness, drainage (pus) from your incision site. °Fever of 101 degrees or higher. °If you should develop stroke (slurred speech, difficulty swallowing, weakness on one side of your body, loss of vision) you should call 911 and go to the nearest emergency room. ° °Reduce your risk of vascular disease: ° °Stop smoking. If you would like help call [facility] at 1-800-QUIT-NOW (1-800-784-8669) or  at 336-586-4000. °Manage your cholesterol °Maintain a desired weight °Control your diabetes °Keep your blood pressure down ° °If you have any questions, please call the office at [phone]. ° °

## 2018-06-11 NOTE — Transfer of Care (Signed)
Immediate Anesthesia Transfer of Care Note  Patient: Darius Baxter  Procedure(s) Performed: ENDARTERECTOMY CAROTID RIGHT (Right Neck) PATCH ANGIOPLASTY WITH XENOSURE PATCH (Right Neck)  Patient Location: PACU  Anesthesia Type:General  Level of Consciousness: awake, alert  and oriented  Airway & Oxygen Therapy: Patient Spontanous Breathing  Post-op Assessment: Report given to RN, Patient moving all extremities X 4 and Patient able to stick tongue midline  Post vital signs: Reviewed and stable  Last Vitals:  Vitals Value Taken Time  BP 136/117 06/11/2018 10:41 AM  Temp    Pulse 87 06/11/2018 10:41 AM  Resp 16 06/11/2018 10:41 AM  SpO2 91 % 06/11/2018 10:41 AM  Vitals shown include unvalidated device data.  Last Pain:  Vitals:   06/11/18 0635  TempSrc: Oral         Complications: No apparent anesthesia complications

## 2018-06-11 NOTE — Anesthesia Procedure Notes (Signed)
Procedure Name: Intubation Date/Time: 06/11/2018 8:39 AM Performed by: Barrington Ellison, CRNA Pre-anesthesia Checklist: Patient identified, Emergency Drugs available, Suction available and Patient being monitored Patient Re-evaluated:Patient Re-evaluated prior to induction Oxygen Delivery Method: Circle System Utilized Preoxygenation: Pre-oxygenation with 100% oxygen Induction Type: IV induction Ventilation: Mask ventilation without difficulty Laryngoscope Size: Mac and 3 Grade View: Grade I Tube type: Oral Tube size: 7.5 mm Number of attempts: 1 Airway Equipment and Method: Stylet and Oral airway Placement Confirmation: ETT inserted through vocal cords under direct vision,  positive ETCO2 and breath sounds checked- equal and bilateral Secured at: 23 cm Tube secured with: Tape Dental Injury: Teeth and Oropharynx as per pre-operative assessment

## 2018-06-11 NOTE — Anesthesia Postprocedure Evaluation (Signed)
Anesthesia Post Note  Patient: Darius Baxter  Procedure(s) Performed: ENDARTERECTOMY CAROTID RIGHT (Right Neck) PATCH ANGIOPLASTY WITH XENOSURE PATCH (Right Neck)     Patient location during evaluation: PACU Anesthesia Type: General Level of consciousness: awake and alert Pain management: pain level controlled Vital Signs Assessment: post-procedure vital signs reviewed and stable Respiratory status: spontaneous breathing, nonlabored ventilation and respiratory function stable Cardiovascular status: blood pressure returned to baseline and stable Postop Assessment: no apparent nausea or vomiting Anesthetic complications: no    Last Vitals:  Vitals:   06/11/18 0637 06/11/18 1043  BP: (!) 181/89   Resp:    Temp:  (!) 36.2 C  SpO2:      Last Pain:  Vitals:   06/11/18 0635  TempSrc: Oral                 Brennan Bailey

## 2018-06-11 NOTE — Progress Notes (Signed)
Patient arrived from PACU. He is alert and oriented. Vital signs are stable. B/P 143/65, Heart Rate 63, O2 96%RA, RR 16, Temp 98.2. Patient has an Arterial Line 142/58. Right side incision area is clean dry and intact. He has been placed on telemetry, CCMD notified, 2nd verification has been done. Patient is resting in bed and not complaining of any pain. I will continue to monitor patient.

## 2018-06-11 NOTE — Anesthesia Procedure Notes (Signed)
Arterial Line Insertion Start/End10/23/2019 7:55 AM, 06/11/2018 8:00 AM Performed by: Barrington Ellison, CRNA, CRNA  Patient location: Pre-op. Preanesthetic checklist: patient identified Lidocaine 1% used for infiltration Left, radial was placed Catheter size: 20 G Hand hygiene performed  and maximum sterile barriers used  Allen's test indicative of satisfactory collateral circulation Attempts: 1 Procedure performed without using ultrasound guided technique. Following insertion, dressing applied and Biopatch. Post procedure assessment: normal  Patient tolerated the procedure well with no immediate complications.

## 2018-06-12 ENCOUNTER — Encounter (HOSPITAL_COMMUNITY): Payer: Self-pay | Admitting: Surgery

## 2018-06-12 ENCOUNTER — Telehealth: Payer: Self-pay | Admitting: Surgery

## 2018-06-12 LAB — BASIC METABOLIC PANEL
ANION GAP: 7 (ref 5–15)
BUN: 14 mg/dL (ref 8–23)
CALCIUM: 8.8 mg/dL — AB (ref 8.9–10.3)
CO2: 20 mmol/L — ABNORMAL LOW (ref 22–32)
Chloride: 113 mmol/L — ABNORMAL HIGH (ref 98–111)
Creatinine, Ser: 0.98 mg/dL (ref 0.61–1.24)
GFR calc Af Amer: >60 mL/min (ref >60)
Glucose, Bld: 122 mg/dL — ABNORMAL HIGH (ref 70–99)
POTASSIUM: 4.1 mmol/L (ref 3.5–5.1)
SODIUM: 140 mmol/L (ref 135–145)

## 2018-06-12 LAB — CBC
HCT: 37.8 % — ABNORMAL LOW (ref 39.0–52.0)
Hemoglobin: 12.1 g/dL — ABNORMAL LOW (ref 13.0–17.0)
MCH: 29.8 pg (ref 26.0–34.0)
MCHC: 32 g/dL (ref 30.0–36.0)
MCV: 93.1 fL (ref 80.0–100.0)
NRBC: 0 % (ref 0.0–0.2)
PLATELETS: 124 10*3/uL — AB (ref 150–400)
RBC: 4.06 MIL/uL — AB (ref 4.22–5.81)
RDW: 13.2 % (ref 11.5–15.5)
WBC: 9.7 10*3/uL (ref 4.0–10.5)

## 2018-06-12 NOTE — Discharge Summary (Signed)
Discharge Summary     Darius Baxter 07/09/1934 84 y.o. male  [id number]  Admission Date: 06/11/2018  Discharge Date: 06/12/2018  Physician: Serafina Mitchell, MD  Admission Diagnosis: RIGHT CAROTID STENOSIS  Discharge Day services:   See progress note 06/12/2018 Physical Exam: Vitals:   06/12/18 0739 06/12/18 0740  BP: (!) 174/101 (!) 184/129  Pulse: (!) 43 (!) 34  Resp: 19 17  Temp: 98 F (36.7 C) 98 F (36.7 C)  SpO2: 96% 94%    Hospital Course:  The patient was admitted to the hospital and taken to the operating room on 06/11/2018 and underwent right carotid endarterectomy.  The pt tolerated the procedure well and was transported to the PACU in good condition.   By POD 1, the pt neuro status remained at baseline.    The remainder of the hospital course consisted of increasing mobilization and increasing intake of solids without difficulty.  He will follow-up with Dr. Trula Slade in office in 2 weeks.  Patient states that he has pain medication at home and will not require a new prescription.  Discharge instructions were reviewed with the patient and he voices understanding.  He will be discharged to home this morning in stable condition.   Recent Labs    06/12/18 0409  NA 140  K 4.1  CL 113*  CO2 20*  GLUCOSE 122*  BUN 14  CALCIUM 8.8*   Recent Labs    06/12/18 0409  WBC 9.7  HGB 12.1*  HCT 37.8*  PLT 124*   No results for input(s): INR in the last 72 hours.     Discharge Diagnosis:  RIGHT CAROTID STENOSIS  Secondary Diagnosis: Patient Active Problem List   Diagnosis Date Noted  . Asymptomatic carotid artery stenosis without infarction, right 06/11/2018  . Carotid stenosis 02/23/2016  . Valvular heart disease 01/05/2014  . PVCs (premature ventricular contractions) 01/05/2014  . Mixed hyperlipidemia 10/10/2011  . Takotsubo cardiomyopathy 10/06/2011  . Essential hypertension, benign 10/27/2010   Past Medical History:  Diagnosis Date    . Arthritis   . Carotid artery occlusion   . Chronic lower back pain   . Coronary artery disease    Mild at cardiac catheterization 2013  . Dysrhythmia    patient states "it may skip a beat"  . Essential hypertension   . History of blood transfusion 2008; 2010  . Mitral valve prolapse    Mild mitral regurgitation  . Mixed hyperlipidemia   . Myocardial infarction ([place])    2013  . Nocturia   . Peripheral vascular disease ([place])    right carotid stenosis  . Renal cancer [facility])    Status post right nephrectomy - 09/2006  . Seizures ([place])    Childhood-84 yrs old "passed out"  . Takotsubo cardiomyopathy    February 2013  . Ventricular ectopy    Bigeminy    Allergies as of 06/12/2018      Reactions   Aleve [naproxen Sodium] Other (See Comments)   Patient has only 1 kidney and Doctor  Doesn't want him to take   Vicodin [hydrocodone-acetaminophen] Nausea And Vomiting   IV only; no problems taking oral tablet      Medication List    TAKE these medications   alfuzosin 10 MG 24 hr tablet Commonly known as:  UROXATRAL Take 10 mg by mouth daily with breakfast.   amLODipine 5 MG tablet Commonly known as:  NORVASC Take 5 mg by mouth daily.   aspirin EC 81 MG tablet  Take 1 tablet (81 mg total) by mouth daily.   atorvastatin 40 MG tablet Commonly known as:  LIPITOR Take 40 mg by mouth daily.   carvedilol 6.25 MG tablet Commonly known as:  COREG Take 6.25 mg by mouth 2 (two) times daily with a meal.   diphenhydramine-acetaminophen 25-500 MG Tabs tablet Commonly known as:  TYLENOL PM Take 2 tablets by mouth at bedtime.   furosemide 20 MG tablet Commonly known as:  LASIX Take 20 mg by mouth daily.   HYDROcodone-acetaminophen 7.5-500 MG tablet Commonly known as:  LORTAB Take 2 tablets by mouth 2 (two) times a week. Wednesday and Sunday mornings   lisinopril 40 MG tablet Commonly known as:  PRINIVIL,ZESTRIL Take 40 mg by mouth every evening.   omeprazole 20 MG  capsule Commonly known as:  PRILOSEC Take 20 mg by mouth daily.   SOOTHE XP OP Place 1 drop into both eyes 3 (three) times daily.   Vitamin D 2000 units tablet Take 2,000 Units by mouth daily.        Discharge Instructions:   [facility] Discharge Instructions Carotid Endarterectomy (CEA)  Please refer to the following instructions for your post-procedure care. Your surgeon or physician assistant will discuss any changes with you.  Activity  You are encouraged to walk as much as you can. You can slowly return to normal activities but must avoid strenuous activity and heavy lifting until your doctor tell you it's OK. Avoid activities such as vacuuming or swinging a golf club. You can drive after one week if you are comfortable and you are no longer taking prescription pain medications. It is normal to feel tired for serval weeks after your surgery. It is also normal to have difficulty with sleep habits, eating, and bowel movements after surgery. These will go away with time.  Bathing/Showering  You may shower after you come home. Do not soak in a bathtub, hot tub, or swim until the incision heals completely.  Incision Care  Shower every day. Clean your incision with mild soap and water. Pat the area dry with a clean towel. You do not need a bandage unless otherwise instructed. Do not apply any ointments or creams to your incision. You may have skin glue on your incision. Do not peel it off. It will come off on its own in about one week. Your incision may feel thickened and raised for several weeks after your surgery. This is normal and the skin will soften over time. For Men Only: It's OK to shave around the incision but do not shave the incision itself for 2 weeks. It is common to have numbness under your chin that could last for several months.  Diet  Resume your normal diet. There are no special food restrictions following this procedure. A low  fat/low cholesterol diet is recommended for all patients with vascular disease. In order to heal from your surgery, it is CRITICAL to get adequate nutrition. Your body requires vitamins, minerals, and protein. Vegetables are the best source of vitamins and minerals. Vegetables also provide the perfect balance of protein. Processed food has little nutritional value, so try to avoid this.  Medications  Resume taking all of your medications unless your doctor or physician assistant tells you not to.  If your incision is causing pain, you may take over-the- counter pain relievers such as acetaminophen (Tylenol). If you were prescribed a stronger pain medication, please be aware these medications can cause nausea and constipation.  Prevent nausea by taking the medication with a snack or meal. Avoid constipation by drinking plenty of fluids and eating foods with a high amount of fiber, such as fruits, vegetables, and grains. Do not take Tylenol if you are taking prescription pain medications.  Follow Up  Our office will schedule a follow up appointment 2-3 weeks following discharge.  Please call us immediately for any of the following conditions  Increased pain, redness, drainage (pus) from your incision site. Fever of 101 degrees or higher. If you should develop stroke (slurred speech, difficulty swallowing, weakness on one side of your body, loss of vision) you should call 911 and go to the nearest emergency room.  Reduce your risk of vascular disease:  Stop smoking. If you would like help call [facility] at 1-800-QUIT-NOW (406)572-7928) or [place] at (604)663-7106. Manage your cholesterol Maintain a desired weight Control your diabetes Keep your blood pressure down  If you have any questions, please call the office at [phone].  Disposition: home  Patient's condition: is Good  Follow up: 1. Dr. Trula Slade in 2 weeks.   Dagoberto Ligas, PA-C [facility] [phone]   --- For [facility] Registry use ---   Modified Rankin score at D/C (0-6): 0  IV medication needed for:  1. Hypertension: No 2. Hypotension: No  Post-op Complications: No  1. Post-op CVA or TIA: No  If yes: Event classification (right eye, left eye, right cortical, left cortical, verterobasilar, other):   If yes: Timing of event (intra-op, <6 hrs post-op, >=6 hrs post-op, unknown):   2. CN injury: No  If yes: CN  injuried   3. Myocardial infarction: No  If yes: Dx by (EKG or clinical, Troponin):   4.  CHF: No  5.  Dysrhythmia (new): No  6. Wound infection: No  7. Reperfusion symptoms: No  8. Return to OR: No  If yes: return to OR for (bleeding, neurologic, other CEA incision, other):   Discharge medications: Statin use:  Yes ASA use:  Yes   Beta blocker use:  Yes ACE-Inhibitor use:  Yes  ARB use:  No CCB use: Yes P2Y12 Antagonist use: No, [ ]  Plavix, [ ]  Plasugrel, [ ]  Ticlopinine, [ ]  Ticagrelor, [ ]  Other, [ ]  No for medical reason, [ ]  Non-compliant, [ ]  Not-indicated Anti-coagulant use:  No, [ ]  Warfarin, [ ]  Rivaroxaban, [ ]  Dabigatran,

## 2018-06-12 NOTE — Progress Notes (Signed)
  Progress Note    06/12/2018 7:47 AM 1 Day Post-Op  Subjective:  Denies stroke like symptoms including slurring speech, changes in vision or one sided weakness.  Denies trouble swallowing, headache.   Vitals:   06/12/18 0739 06/12/18 0740  BP: (!) 174/101 (!) 184/129  Pulse: (!) 43 (!) 34  Resp: 19 17  Temp: 98 F (36.7 C)   SpO2: 96% 94%   Physical Exam: Cardiac:  RRR Lungs:  Non labored Incisions:  R neck incision c/d/i without firm hematoma Extremities:  Moving all extremities well; grip strength symmetrical Abdomen:  Soft Neurologic: A&O  CBC    Component Value Date/Time   WBC 9.7 06/12/2018 0409   RBC 4.06 (L) 06/12/2018 0409   HGB 12.1 (L) 06/12/2018 0409   HCT 37.8 (L) 06/12/2018 0409   PLT 124 (L) 06/12/2018 0409   MCV 93.1 06/12/2018 0409   MCH 29.8 06/12/2018 0409   MCHC 32.0 06/12/2018 0409   RDW 13.2 06/12/2018 0409   LYMPHSABS 1.6 01/22/2012 0933   MONOABS 0.4 01/22/2012 0933   EOSABS 0.1 01/22/2012 0933   BASOSABS 0.1 01/22/2012 0933    BMET    Component Value Date/Time   NA 140 06/12/2018 0409   K 4.1 06/12/2018 0409   CL 113 (H) 06/12/2018 0409   CO2 20 (L) 06/12/2018 0409   GLUCOSE 122 (H) 06/12/2018 0409   BUN 14 06/12/2018 0409   CREATININE 0.98 06/12/2018 0409   CALCIUM 8.8 (L) 06/12/2018 0409   GFRNONAA >60 06/12/2018 0409   GFRAA >60 06/12/2018 0409    INR    Component Value Date/Time   INR 1.05 06/05/2018 1101     Intake/Output Summary (Last 24 hours) at 06/12/2018 0748 Last data filed at 06/12/2018 0739 Gross per 24 hour  Intake 2721.58 ml  Output 1200 ml  Net 1521.58 ml     Assessment/Plan:  84 y.o. male is s/p R CEA 1 Day Post-Op   Neuro exam at baseline Incision is unremarkable Ok for d/c home when daughter is able to pick patient up from hospital today Follow up in office in 2 weeks   Dagoberto Ligas, PA-C [facility] [phone] 06/12/2018 7:48 AM

## 2018-06-12 NOTE — Telephone Encounter (Signed)
sch appt spk to pt wife 06/23/18 10am p/o MD

## 2018-06-12 NOTE — Telephone Encounter (Signed)
-----   Message from Dagoberto Ligas, PA-C sent at 06/12/2018  7:45 AM EDT -----  Can you schedule an appt for this pt in 2 weeks with Dr. Trula Slade.  PO R CEA. Thanks, [facility]

## 2018-06-23 ENCOUNTER — Ambulatory Visit (INDEPENDENT_AMBULATORY_CARE_PROVIDER_SITE_OTHER): Payer: Medicare Other | Admitting: Surgery

## 2018-06-23 ENCOUNTER — Other Ambulatory Visit: Payer: Self-pay

## 2018-06-23 ENCOUNTER — Encounter: Payer: Self-pay | Admitting: Surgery

## 2018-06-23 VITALS — BP 145/84 | HR 60 | Temp 97.5°F | Resp 20 | Ht 73.0 in | Wt 223.0 lb

## 2018-06-23 DIAGNOSIS — I6523 Occlusion and stenosis of bilateral carotid arteries: Secondary | ICD-10-CM

## 2018-06-23 NOTE — Progress Notes (Signed)
   Patient name: Darius Baxter MRN: [id number] DOB: [birth year] Sex: male  REASON FOR VISIT:     post op  HISTORY OF PRESENT ILLNESS:   Darius Geter Bullinsis a 84 y.o.malewho is status post left carotid endarterectomy with bovine pericardial patch angioplasty on 02/23/2016. This was done for symptomatic left carotid stenosis. The patient was having amaurosis fugax in the left eye prior to his operation. Intraoperative findings included a 90% stenosis with a ruptured plaque at the bifurcation with soft plaque.  He was recently found to have progression of the right-sided stenosis to greater than 80%.  He was asymptomatic.  Therefore on 06/11/2018 he underwent right carotid endarterectomy with bovine pericardial patch angioplasty.  Intraoperative findings included a low bifurcation.  He had a ulcerated plaque with mobile debris.  His postoperative course was uncomplicated.  He is medically managed for hypertension, including an ACE inhibitor.  He is on a statin for hyperlipidemia.  He is a non-smoker.  CURRENT MEDICATIONS:    Current Outpatient Medications  Medication Sig Dispense Refill  . alfuzosin (UROXATRAL) 10 MG 24 hr tablet Take 10 mg by mouth daily with breakfast.     . amLODipine (NORVASC) 5 MG tablet Take 5 mg by mouth daily.    . Artificial Tear Solution (SOOTHE XP OP) Place 1 drop into both eyes 3 (three) times daily.    Marland Kitchen aspirin EC 81 MG tablet Take 1 tablet (81 mg total) by mouth daily. 30 tablet 3  . atorvastatin (LIPITOR) 40 MG tablet Take 40 mg by mouth daily.    . carvedilol (COREG) 6.25 MG tablet Take 6.25 mg by mouth 2 (two) times daily with a meal.    . Cholecalciferol (VITAMIN D) 2000 units tablet Take 2,000 Units by mouth daily.    . diphenhydramine-acetaminophen (TYLENOL PM) 25-500 MG TABS tablet Take 2 tablets by mouth at bedtime.     . furosemide (LASIX) 20 MG tablet Take 20 mg by mouth daily.     Marland Kitchen HYDROcodone-acetaminophen  (LORTAB) 7.5-500 MG per tablet Take 2 tablets by mouth 2 (two) times a week. Wednesday and Sunday mornings    . lisinopril (PRINIVIL,ZESTRIL) 40 MG tablet Take 40 mg by mouth every evening.    Marland Kitchen omeprazole (PRILOSEC) 20 MG capsule Take 20 mg by mouth daily.     No current facility-administered medications for this visit.     REVIEW OF SYSTEMS:   [X]  denotes positive finding, [ ]  denotes negative finding Cardiac  Comments:  Chest pain or chest pressure:    Shortness of breath upon exertion:    Short of breath when lying flat:    Irregular heart rhythm:    Constitutional    Fever or chills:      PHYSICAL EXAM:   Vitals:   06/23/18 1016 06/23/18 1022  BP: (!) 160/89 (!) 145/84  Pulse: 60   Resp: 20   Temp: (!) 97.5 F (36.4 C)   TempSrc: Oral   SpO2: 95%   Weight: 223 lb (101.2 kg)   Height: 6\' 1"  (1.854 m)     GENERAL: The patient is a well-nourished male, in no acute distress. The vital signs are documented above. CARDIOVASCULAR: There is a regular rate and rhythm. PULMONARY: Non-labored respirations Incision well healed Neuro intact  STUDIES:   none   MEDICAL ISSUES:   F/u 6 months with carotid duplex  [name], MD [facility] [phone] Pager [phone]

## 2019-04-24 ENCOUNTER — Other Ambulatory Visit: Payer: Self-pay

## 2019-04-24 ENCOUNTER — Encounter: Payer: Self-pay | Admitting: Cardiology

## 2019-04-24 ENCOUNTER — Ambulatory Visit: Payer: Medicare Other | Admitting: Cardiology

## 2019-04-24 VITALS — BP 182/82 | HR 70 | Ht 73.0 in | Wt 213.0 lb

## 2019-04-24 DIAGNOSIS — Z8679 Personal history of other diseases of the circulatory system: Secondary | ICD-10-CM

## 2019-04-24 DIAGNOSIS — E782 Mixed hyperlipidemia: Secondary | ICD-10-CM

## 2019-04-24 DIAGNOSIS — I6523 Occlusion and stenosis of bilateral carotid arteries: Secondary | ICD-10-CM | POA: Diagnosis not present

## 2019-04-24 NOTE — Progress Notes (Signed)
Cardiology Office Note  Date: 04/24/2019   ID: Darius Baxter, DOB 1934/04/12, MRN [id number]  PCP:  [facility] Cardiologist:  Rozann Lesches, MD Electrophysiologist:  None   No chief complaint on file.   History of Present Illness: Darius Baxter is an 85 y.o. male last seen in July 2019.  He presents for a follow-up visit.  He tells me that he has not experienced any exertional chest pain or worsening shortness of breath since last encounter.  He is limited by back and knee pain but does take care of his ADLs including yard work.  I reviewed his lab work from May as outlined below.  He reports compliance with his medications which are listed below.  He has carotid artery disease as evident by Dopplers done in October 2019.  He underwent right carotid endarterectomy per Dr. Trula Slade in October 2019.  He last saw [facility] in November 2019 and is due for follow-up now.  Follow-up echocardiogram August 2019 revealed LVEF 55 to 60% range.  I personally reviewed his ECG today which shows sinus rhythm with diffuse repolarization abnormalities.  His blood pressure is elevated today.  He states that he has a blood pressure cuff at home but does not check it regularly.  I asked him to record blood pressure once a day for 2 weeks prior to his pending visit with PCP later this month.  Past Medical History:  Diagnosis Date  . Arthritis   . Carotid artery disease ([place])   . Chronic lower back pain   . Coronary artery disease    Mild at cardiac catheterization 2013  . Essential hypertension   . History of blood transfusion 2008; 2010  . Mitral valve prolapse    Mild mitral regurgitation  . Mixed hyperlipidemia   . Myocardial infarction ([place])    2013  . Nocturia   . Renal cancer [facility])    Status post right nephrectomy - 09/2006  . Seizures ([place])    Childhood-85 yrs old "passed out"  . Takotsubo cardiomyopathy    February 2013  . Ventricular ectopy    Bigeminy    Past  Surgical History:  Procedure Laterality Date  . APPENDECTOMY    . CARDIAC CATHETERIZATION    . CAROTID ENDARTERECTOMY Left 02/23/2016  . CAROTID ENDARTERECTOMY Right 06/11/2018   PATCH ANGIOPLASTY WITH XENOSURE PATCH  . CATARACT EXTRACTION W/PHACO  03/22/2011   Procedure: CATARACT EXTRACTION PHACO AND INTRAOCULAR LENS PLACEMENT (IOC);  Surgeon: Tonny Branch;  Location: [facility];  Service: Ophthalmology;  Laterality: Right;  CDE  19.88  . CATARACT EXTRACTION W/PHACO  04/16/2011   Procedure: CATARACT EXTRACTION PHACO AND INTRAOCULAR LENS PLACEMENT (IOC);  Surgeon: Tonny Branch;  Location: [facility];  Service: Ophthalmology;  Laterality: Left;  CDE 21.13  . CHOLECYSTECTOMY  01/29/2012   Procedure: LAPAROSCOPIC CHOLECYSTECTOMY WITH INTRAOPERATIVE CHOLANGIOGRAM;  Surgeon: Joyice Faster. Cornett, MD;  Location: [place];  Service: General;  Laterality: N/A;  . ENDARTERECTOMY Left 02/23/2016   Procedure: LEFT CAROTID ENDARTERECTOMY WITH XENOSURE BOVINE PERICARDIUM PATCH ANGIOPLASTY;  Surgeon: Serafina Mitchell, MD;  Location: [place];  Service: Vascular;  Laterality: Left;  . ENDARTERECTOMY Right 06/11/2018   Procedure: ENDARTERECTOMY CAROTID RIGHT;  Surgeon: Serafina Mitchell, MD;  Location: [place];  Service: Vascular;  Laterality: Right;  . JOINT REPLACEMENT    . KNEE ARTHROSCOPY Right   . LITHOTRIPSY    . NEPHRECTOMY Right 2008  . PATCH ANGIOPLASTY Right 06/11/2018   Procedure: PATCH ANGIOPLASTY WITH  Weyman Pedro;  Surgeon: Serafina Mitchell, MD;  Location: [facility];  Service: Vascular;  Laterality: Right;  . TOTAL KNEE ARTHROPLASTY  2010  . TOTAL KNEE ARTHROPLASTY Left 2014    Current Outpatient Medications  Medication Sig Dispense Refill  . alfuzosin (UROXATRAL) 10 MG 24 hr tablet Take 10 mg by mouth daily with breakfast.     . amLODipine (NORVASC) 5 MG tablet Take 5 mg by mouth daily.    . Artificial Tear Solution (SOOTHE XP OP) Place 1 drop into both eyes 3 (three) times daily.    Marland Kitchen aspirin EC 81 MG tablet Take 1  tablet (81 mg total) by mouth daily. 30 tablet 3  . atorvastatin (LIPITOR) 40 MG tablet Take 40 mg by mouth daily.    . carvedilol (COREG) 6.25 MG tablet Take 6.25 mg by mouth 2 (two) times daily with a meal.    . Cholecalciferol (VITAMIN D) 2000 units tablet Take 2,000 Units by mouth daily.    . diphenhydramine-acetaminophen (TYLENOL PM) 25-500 MG TABS tablet Take 2 tablets by mouth at bedtime.     . furosemide (LASIX) 20 MG tablet Take 20 mg by mouth daily.     Marland Kitchen HYDROcodone-acetaminophen (LORTAB) 7.5-500 MG per tablet Take 2 tablets by mouth 2 (two) times a week. Wednesday and Sunday mornings    . lisinopril (PRINIVIL,ZESTRIL) 40 MG tablet Take 40 mg by mouth every evening.    Marland Kitchen omeprazole (PRILOSEC) 20 MG capsule Take 20 mg by mouth daily.     No current facility-administered medications for this visit.    Allergies:  Aleve [naproxen sodium] and Vicodin [hydrocodone-acetaminophen]   Social History: The patient  reports that he has never smoked. He has never used smokeless tobacco. He reports that he does not drink alcohol or use drugs.   ROS:  Please see the history of present illness. Otherwise, complete review of systems is positive for none.  All other systems are reviewed and negative.   Physical Exam: VS:  BP (!) 182/82   Pulse 70   Ht 6\' 1"  (1.854 m)   Wt 213 lb (96.6 kg)   SpO2 97%   BMI 28.10 kg/m , BMI Body mass index is 28.1 kg/m.  Wt Readings from Last 3 Encounters:  04/24/19 213 lb (96.6 kg)  06/23/18 223 lb (101.2 kg)  06/11/18 223 lb 15.8 oz (101.6 kg)    General: Patient appears comfortable at rest. HEENT: Conjunctiva and lids normal, wearing a mask. Neck: Supple, no elevated JVP or carotid bruits, no thyromegaly. Lungs: Clear to auscultation, nonlabored breathing at rest. Cardiac: Regular rate and rhythm, no S3, soft apical systolic murmur. Abdomen: Soft, nontender, bowel sounds present. Extremities: No pitting edema, distal pulses 2+. Skin: Warm and dry.  Musculoskeletal: No kyphosis. Neuropsychiatric: Alert and oriented x3, affect grossly appropriate.  ECG:  An ECG dated 02/25/2018 was personally reviewed today and demonstrated:  Sinus rhythm with nonspecific ST changes.  Recent Labwork: 06/05/2018: ALT 18; AST 26 06/12/2018: BUN 14; Creatinine, Ser 0.98; Hemoglobin 12.1; Platelets 124; Potassium 4.1; Sodium 140     Component Value Date/Time   CHOL 143 10/06/2011 0530   TRIG 310 (H) 10/06/2011 0530   HDL 40 10/06/2011 0530   CHOLHDL 3.6 10/06/2011 0530   VLDL 62 (H) 10/06/2011 0530   LDLCALC 41 10/06/2011 0530  May 2020: Hemoglobin 14.8, platelets 135, BUN 15, creatinine 1.05, potassium 4.5, AST 11, ALT 8 cholesterol 122, triglycerides 143, HDL 45, LDL 48  Other Studies Reviewed  Today:  Echocardiogram 03/20/2018: Study Conclusions  - Left ventricle: The cavity size was normal. Wall thickness was   increased in a pattern of mild LVH. Systolic function was normal.   The estimated ejection fraction was in the range of 55% to 60%.   Wall motion was normal; there were no regional wall motion   abnormalities. Doppler parameters are consistent with abnormal   left ventricular relaxation (grade 1 diastolic dysfunction). - Aortic valve: Mildly calcified annulus. Trileaflet. There was   trivial regurgitation. - Mitral valve: Mildly calcified annulus. There was mild   regurgitation. - Right atrium: Central venous pressure (est): 3 mm Hg. - Atrial septum: No defect or patent foramen ovale was identified. - Tricuspid valve: There was trivial regurgitation. - Pulmonary arteries: PA peak pressure: 18 mm Hg (S). - Pericardium, extracardiac: There was no pericardial effusion.  Carotid Dopplers 05/22/2018: Final Interpretation: Right Carotid: Velocities in the right ICA are consistent with a 80-99%                stenosis.  Left Carotid: Velocities in the left ICA are consistent with a 1-39% stenosis.               The extracranial vessels  were near-normal with only minimal wall               thickening or plaque.  Vertebrals:  Bilateral vertebral arteries demonstrate antegrade flow. Subclavians: Normal flow hemodynamics were seen in bilateral subclavian              arteries.  Assessment and Plan:  1.  History of stress-induced cardiomyopathy with LVEF normal range, 55 to 60% by echocardiogram last year.  He does not describe any clear change in symptoms in the meanwhile and we will continue with medical therapy and observation for now.  2.  Carotid artery disease status post right CEA last year.  We will arrange follow-up with Dr. Trula Slade.  Continue aspirin and statin.  3.  Essential hypertension.  Blood pressure is elevated today.  I have asked him to record blood pressure once daily for 2 weeks prior to his pending office visit with PCP later in the month.  He may need further medication up titration.  4.  Mixed hyperlipidemia on Lipitor.  Recent LDL 48.  Medication Adjustments/Labs and Tests Ordered: Current medicines are reviewed at length with the patient today.  Concerns regarding medicines are outlined above.   Tests Ordered: Orders Placed This Encounter  Procedures  . EKG 12-Lead    Medication Changes: No orders of the defined types were placed in this encounter.   Disposition:  Follow up 1 year in the [place] office.  Signed, Satira Sark, MD, [facility] 04/24/2019 2:16 PM    [facility] [address], [place], [place] [address] Phone: [phone]; Fax: [phone]

## 2019-04-24 NOTE — Patient Instructions (Addendum)
Your physician wants you to follow-up in: [place] will receive a reminder letter in the mail two months in advance. If you don't receive a letter, please call our office to schedule the follow-up appointment.  Your physician recommends that you continue on your current medications as directed. Please refer to the Current Medication list given to you today.  WE WILL GET YOU SCHEDULED WITH DR Trula Slade   Thank you for choosing [facility]!!

## 2019-05-22 ENCOUNTER — Ambulatory Visit: Payer: Medicare Other | Admitting: Cardiology

## 2019-05-23 ENCOUNTER — Emergency Department (HOSPITAL_COMMUNITY): Payer: Medicare Other

## 2019-05-23 ENCOUNTER — Encounter (HOSPITAL_COMMUNITY): Payer: Self-pay

## 2019-05-23 ENCOUNTER — Emergency Department (HOSPITAL_COMMUNITY)
Admission: EM | Admit: 2019-05-23 | Discharge: 2019-05-23 | Disposition: A | Discharge status: Home / Self Care | Payer: Medicare Other | Attending: Emergency Medicine | Admitting: Emergency Medicine

## 2019-05-23 ENCOUNTER — Other Ambulatory Visit: Payer: Self-pay

## 2019-05-23 DIAGNOSIS — Z79899 Other long term (current) drug therapy: Secondary | ICD-10-CM | POA: Diagnosis not present

## 2019-05-23 DIAGNOSIS — Z7982 Long term (current) use of aspirin: Secondary | ICD-10-CM | POA: Diagnosis not present

## 2019-05-23 DIAGNOSIS — I2510 Atherosclerotic heart disease of native coronary artery without angina pectoris: Secondary | ICD-10-CM | POA: Insufficient documentation

## 2019-05-23 DIAGNOSIS — I10 Essential (primary) hypertension: Secondary | ICD-10-CM | POA: Diagnosis present

## 2019-05-23 DIAGNOSIS — M47812 Spondylosis without myelopathy or radiculopathy, cervical region: Secondary | ICD-10-CM | POA: Insufficient documentation

## 2019-05-23 LAB — CBC WITH DIFFERENTIAL/PLATELET
Abs Immature Granulocytes: 0.02 10*3/uL (ref 0.00–0.07)
Basophils Absolute: 0.1 10*3/uL (ref 0.0–0.1)
Basophils Relative: 1 %
Eosinophils Absolute: 0.2 10*3/uL (ref 0.0–0.5)
Eosinophils Relative: 3 %
HCT: 47.6 % (ref 39.0–52.0)
Hemoglobin: 15.2 g/dL (ref 13.0–17.0)
Immature Granulocytes: 0 %
Lymphocytes Relative: 24 %
Lymphs Abs: 1.7 10*3/uL (ref 0.7–4.0)
MCH: 31.1 pg (ref 26.0–34.0)
MCHC: 31.9 g/dL (ref 30.0–36.0)
MCV: 97.3 fL (ref 80.0–100.0)
Monocytes Absolute: 0.6 10*3/uL (ref 0.1–1.0)
Monocytes Relative: 9 %
Neutro Abs: 4.4 10*3/uL (ref 1.7–7.7)
Neutrophils Relative %: 63 %
Platelets: 155 10*3/uL (ref 150–400)
RBC: 4.89 MIL/uL (ref 4.22–5.81)
RDW: 13 % (ref 11.5–15.5)
WBC: 7 10*3/uL (ref 4.0–10.5)
nRBC: 0 % (ref 0.0–0.2)

## 2019-05-23 LAB — BASIC METABOLIC PANEL
Anion gap: 9 (ref 5–15)
BUN: 22 mg/dL (ref 8–23)
CO2: 26 mmol/L (ref 22–32)
Calcium: 10.1 mg/dL (ref 8.9–10.3)
Chloride: 104 mmol/L (ref 98–111)
Creatinine, Ser: 0.91 mg/dL (ref 0.61–1.24)
GFR calc Af Amer: >60 mL/min (ref >60)
GFR calc non Af Amer: >60 mL/min (ref >60)
Glucose, Bld: 110 mg/dL — ABNORMAL HIGH (ref 70–99)
Potassium: 4.5 mmol/L (ref 3.5–5.1)
Sodium: 139 mmol/L (ref 135–145)

## 2019-05-23 LAB — TROPONIN I (HIGH SENSITIVITY)
Troponin I (High Sensitivity): 5 ng/L (ref <18)
Troponin I (High Sensitivity): 8 ng/L (ref <18)

## 2019-05-23 MED ORDER — SODIUM CHLORIDE 0.9 % IV SOLN
Freq: Once | INTRAVENOUS | Status: AC
  Administered 2019-05-23: 22:00:00 250 mL via INTRAVENOUS

## 2019-05-23 MED ORDER — PREDNISONE 20 MG PO TABS: 20.0000 mg | ORAL_TABLET | Freq: Two times a day (BID) | ORAL | 0 refills | Status: DC

## 2019-05-23 MED ORDER — IOHEXOL 350 MG/ML SOLN
100.0000 mL | Freq: Once | INTRAVENOUS | Status: AC | PRN
  Administered 2019-05-23: 21:00:00 100 mL via INTRAVENOUS

## 2019-05-23 NOTE — ED Notes (Signed)
ED Provider at bedside. 

## 2019-05-23 NOTE — ED Triage Notes (Signed)
Pt is having right sided neck pain that started 6 weeks ago. Hypertension for the last week as well. Pt feeling slightly dizzy.

## 2019-05-23 NOTE — ED Provider Notes (Signed)
[facility] EMERGENCY DEPARTMENT Provider Note   CSN: [id number] Arrival date & time: 05/23/19  1552     History   Chief Complaint Chief Complaint  Patient presents with  . Hypertension    HPI Darius Baxter is a 85 y.o. male.     HPI   He presents for evaluation of intermittent neck pain for 4 weeks, right posterior, with high blood pressure, in the range of 200/90, at home.  He denies headache, weakness, dizziness, fever, chills, nausea or vomiting.  He is taking his usual blood pressure medications.  He denies chest pain, shortness of breath, back pain.  There are no other known modifying factors.  Past Medical History:  Diagnosis Date  . Arthritis   . Carotid artery disease ([place])   . Chronic lower back pain   . Coronary artery disease    Mild at cardiac catheterization 2013  . Essential hypertension   . History of blood transfusion 2008; 2010  . Mitral valve prolapse    Mild mitral regurgitation  . Mixed hyperlipidemia   . Myocardial infarction ([place])    2013  . Nocturia   . Renal cancer [facility])    Status post right nephrectomy - 09/2006  . Seizures ([place])    Childhood-85 yrs old "passed out"  . Takotsubo cardiomyopathy    February 2013  . Ventricular ectopy    Bigeminy    Patient Active Problem List   Diagnosis Date Noted  . Asymptomatic carotid artery stenosis without infarction, right 06/11/2018  . Carotid stenosis 02/23/2016  . Valvular heart disease 01/05/2014  . PVCs (premature ventricular contractions) 01/05/2014  . Mixed hyperlipidemia 10/10/2011  . Takotsubo cardiomyopathy 10/06/2011  . Essential hypertension, benign 10/27/2010    Past Surgical History:  Procedure Laterality Date  . APPENDECTOMY    . CARDIAC CATHETERIZATION    . CAROTID ENDARTERECTOMY Left 02/23/2016  . CAROTID ENDARTERECTOMY Right 06/11/2018   PATCH ANGIOPLASTY WITH XENOSURE PATCH  . CATARACT EXTRACTION W/PHACO  03/22/2011   Procedure: CATARACT EXTRACTION PHACO AND INTRAOCULAR  LENS PLACEMENT (IOC);  Surgeon: Tonny Branch;  Location: [facility];  Service: Ophthalmology;  Laterality: Right;  CDE  19.88  . CATARACT EXTRACTION W/PHACO  04/16/2011   Procedure: CATARACT EXTRACTION PHACO AND INTRAOCULAR LENS PLACEMENT (IOC);  Surgeon: Tonny Branch;  Location: [facility];  Service: Ophthalmology;  Laterality: Left;  CDE 21.13  . CHOLECYSTECTOMY  01/29/2012   Procedure: LAPAROSCOPIC CHOLECYSTECTOMY WITH INTRAOPERATIVE CHOLANGIOGRAM;  Surgeon: Joyice Faster. Cornett, MD;  Location: [facility];  Service: General;  Laterality: N/A;  . ENDARTERECTOMY Left 02/23/2016   Procedure: LEFT CAROTID ENDARTERECTOMY WITH XENOSURE BOVINE PERICARDIUM PATCH ANGIOPLASTY;  Surgeon: Serafina Mitchell, MD;  Location: [place];  Service: Vascular;  Laterality: Left;  . ENDARTERECTOMY Right 06/11/2018   Procedure: ENDARTERECTOMY CAROTID RIGHT;  Surgeon: Serafina Mitchell, MD;  Location: [facility];  Service: Vascular;  Laterality: Right;  . JOINT REPLACEMENT    . KNEE ARTHROSCOPY Right   . LITHOTRIPSY    . NEPHRECTOMY Right 2008  . PATCH ANGIOPLASTY Right 06/11/2018   Procedure: PATCH ANGIOPLASTY WITH Weyman Pedro;  Surgeon: Serafina Mitchell, MD;  Location: [facility] OR;  Service: Vascular;  Laterality: Right;  . TOTAL KNEE ARTHROPLASTY  2010  . TOTAL KNEE ARTHROPLASTY Left 2014        Home Medications    Prior to Admission medications   Medication Sig Start Date End Date Taking? Authorizing Provider  alfuzosin (UROXATRAL) 10 MG 24 hr tablet Take 10 mg  by mouth daily with breakfast.  12/19/13  Yes [provider]  amLODipine (NORVASC) 5 MG tablet Take 5 mg by mouth daily.   Yes [provider]  aspirin EC 81 MG tablet Take 1 tablet (81 mg total) by mouth daily. 02/25/18  Yes Satira Sark, MD  atorvastatin (LIPITOR) 40 MG tablet Take 40 mg by mouth daily. 02/02/16  Yes [provider]  carvedilol (COREG) 6.25 MG tablet Take 6.25 mg by mouth 2 (two) times daily with a meal.   Yes Hope, Jessica A, PA-C   Cholecalciferol (VITAMIN D) 2000 units tablet Take 2,000 Units by mouth daily.   Yes [provider]  furosemide (LASIX) 20 MG tablet Take 20 mg by mouth daily.    Yes [provider]  HYDROcodone-acetaminophen (LORTAB) 7.5-500 MG per tablet Take 2 tablets by mouth 2 (two) times a week. Wednesday and Sunday mornings   Yes [provider]  lisinopril (PRINIVIL,ZESTRIL) 40 MG tablet Take 40 mg by mouth every evening.   Yes Hope, Jessica A, PA-C  omeprazole (PRILOSEC) 20 MG capsule Take 20 mg by mouth daily. 05/12/18  Yes [provider]  Artificial Tear Solution (SOOTHE XP OP) Place 1 drop into both eyes 3 (three) times daily.    [provider]  diphenhydramine-acetaminophen (TYLENOL PM) 25-500 MG TABS tablet Take 2 tablets by mouth at bedtime.     [provider]  predniSONE (DELTASONE) 20 MG tablet Take 1 tablet (20 mg total) by mouth 2 (two) times daily. 05/23/19   Daleen Bo, MD    Family History Family History  Problem Relation Age of Onset  . Cancer Other   . Stroke Other   . Coronary artery disease Other   . Hypertension Other   . Hypertension Mother   . Peripheral Artery Disease Mother        had leg bypass, and died after surgery; believed to be from throwing a clot  . CVA Father   . Prostate cancer Father   . Anesthesia problems Neg Hx   . Hypotension Neg Hx   . Malignant hyperthermia Neg Hx   . Pseudochol deficiency Neg Hx     Social History Social History   Tobacco Use  . Smoking status: Never Smoker  . Smokeless tobacco: Never Used  Substance Use Topics  . Alcohol use: No    Alcohol/week: 0.0 standard drinks  . Drug use: No     Allergies   Aleve [naproxen sodium] and Vicodin [hydrocodone-acetaminophen]   Review of Systems Review of Systems  All other systems reviewed and are negative.    Physical Exam Updated Vital Signs BP (!) 224/93   Pulse (!) 48   Temp 97.7 F (36.5 C) (Oral)   Resp 17    Ht 6\' 1"  (1.854 m)   Wt 96.6 kg   SpO2 98%   BMI 28.10 kg/m   Physical Exam Vitals signs and nursing note reviewed.  Constitutional:      General: He is not in acute distress.    Appearance: He is well-developed. He is not ill-appearing, toxic-appearing or diaphoretic.  HENT:     Head: Normocephalic and atraumatic.     Right Ear: External ear normal.     Left Ear: External ear normal.     Mouth/Throat:     Mouth: Mucous membranes are moist.     Pharynx: No oropharyngeal exudate or posterior oropharyngeal erythema.  Eyes:     Conjunctiva/sclera: Conjunctivae normal.  Pupils: Pupils are equal, round, and reactive to light.  Neck:     Musculoskeletal: Normal range of motion and neck supple.     Trachea: Phonation normal.  Cardiovascular:     Rate and Rhythm: Normal rate and regular rhythm.     Heart sounds: Normal heart sounds.  Pulmonary:     Effort: Pulmonary effort is normal.     Breath sounds: Normal breath sounds.  Musculoskeletal:     Comments: Somewhat limited cervical motion with decreased right lateral bending.  No pain to palpation of the neck musculature.  Skin:    General: Skin is warm and dry.  Neurological:     Mental Status: He is alert and oriented to person, place, and time.     Cranial Nerves: No cranial nerve deficit.     Sensory: No sensory deficit.     Motor: No weakness or abnormal muscle tone.     Coordination: Coordination normal.  Psychiatric:        Mood and Affect: Mood normal.        Behavior: Behavior normal.        Thought Content: Thought content normal.        Judgment: Judgment normal.      ED Treatments / Results  Labs (all labs ordered are listed, but only abnormal results are displayed) Labs Reviewed  BASIC METABOLIC PANEL - Abnormal; Notable for the following components:      Result Value   Glucose, Bld 110 (*)    All other components within normal limits  CBC WITH DIFFERENTIAL/PLATELET  TROPONIN I (HIGH SENSITIVITY)   TROPONIN I (HIGH SENSITIVITY)    EKG EKG Interpretation  Date/Time:  Saturday May 23 2019 16:19:43 EDT Ventricular Rate:  90 PR Interval:  186 QRS Duration: 84 QT Interval:  356 QTC Calculation: 435 R Axis:   41 Text Interpretation:  Sinus rhythm with frequent Premature ventricular complexes in a pattern of bigeminy Septal infarct , age undetermined ST & T wave abnormality, consider inferior ischemia Abnormal ECG Since last tracing ectopy ans ST abnormality are new Confirmed by Daleen Bo [phone]) on 05/23/2019 4:39:58 PM   Radiology Dg Chest 2 View  Result Date: 05/23/2019 CLINICAL DATA:  Right-sided neck pain. Hypertension for the past week with dizziness. EXAM: CHEST - 2 VIEW COMPARISON:  09/19/2016, 09/20/2014 FINDINGS: Lungs are adequately inflated and demonstrate subtle patchy density over the lung bases better seen on the frontal film. Findings may be due to infection versus vascular crowding. No effusion. Cardiomediastinal silhouette and remainder of the exam is unchanged. IMPRESSION: Subtle patchy bibasilar density which may be due to infection versus vascular crowding. Electronically Signed   By: Marin Olp M.D.   On: 05/23/2019 17:21   Ct Angio Neck W And/or Wo Contrast  Result Date: 05/23/2019 CLINICAL DATA:  Dissection of vertebral artery. Additional history provided: Right-sided neck pain that started 6 weeks ago, hypertension for the last week, dizziness. EXAM: CT ANGIOGRAPHY NECK TECHNIQUE: Multidetector CT imaging of the neck was performed using the standard protocol during bolus administration of intravenous contrast. Multiplanar CT image reconstructions and MIPs were obtained to evaluate the vascular anatomy. Carotid stenosis measurements (when applicable) are obtained utilizing NASCET criteria, using the distal internal carotid diameter as the denominator. CONTRAST:  12mL OMNIPAQUE IOHEXOL 350 MG/ML SOLN COMPARISON:  Carotid artery duplex 02/06/2016 FINDINGS:  Aortic arch: Ectatic ascending thoracic aorta measuring up to 3.8 cm in diameter. There is scattered atherosclerotic plaque within the visualized aortic arch and  proximal major branch vessels of the neck. Included portions of the aortic arch demonstrate no evidence of dissection or aneurysm. Right carotid system: CCA and ICA patent within the neck without significant stenosis (50% or greater). Predominantly noncalcified plaque within the common and cervical internal carotid arteries. Calcified atherosclerosis within the intracranial carotid artery siphon with regions of no more than mild luminal narrowing. Visualized right middle and anterior cerebral arteries patent without significant proximal stenosis Left carotid system: CCA and ICA patent within the neck without significant stenosis (50% or greater). Predominantly noncalcified plaque within the common and cervical internal carotid arteries. Calcified atherosclerosis within the intracranial internal carotid artery siphon. Mild-to-moderate stenosis of the supraclinoid left internal carotid artery, with additional sites of mild stenosis. Visualized left middle and anterior cerebral arteries patent without significant proximal stenosis. Vertebral arteries: Calcified plaque within the proximal V1 right vertebral artery with resultant at least moderate stenosis. Distal to this, the right vertebral artery is patent within the neck without significant stenosis (50% or greater). The left vertebral artery is patent within the neck without significant stenosis (50% or greater). Mild plaque within the prox V1 left vertebral artery. The intracranial vertebral arteries are patent to the basilar. The basilar artery is patent without significant stenosis. The bilateral posterior cerebral arteries are patent without significant proximal stenosis. Skeleton: Cervical spondylosis. Severe disc height loss at C3-C4, C5-C6 and C6-C7. Multilevel posterior disc osteophytes,  uncovertebral and facet hypertrophy. There is also moderate to severe intervertebral disc height loss at T1-T2 and T2-T3. No acute bony abnormality. Other neck: No neck mass. Multiple subcentimeter thyroid lobe nodules. Upper chest: No consolidation within the imaged lung apices. Other: Partial opacification of the bilateral sphenoid sinuses. No significant mastoid effusion IMPRESSION: 1. No evidence of carotid or vertebral artery dissection. 2. The common and internal carotid arteries are patent within the neck bilaterally without significant stenosis (50% or greater). Atherosclerotic disease within the visualized intracranial ICA siphons with sites of mild and mild-to-moderate stenosis as described. 3. The bilateral vertebral arteries are patent within the neck. Calcified plaque at the origin of the right vertebral artery with resultant at least moderate stenosis. Otherwise, no significant stenosis within the bilateral vertebral arteries (50% or greater). 4. Ectatic ascending aorta. Visualized portions measuring up to 3.8 cm in diameter. 5. Cervical and upper thoracic spondylosis as described with levels of moderate/severe disc height loss. Electronically Signed   By: Kellie Simmering   On: 05/23/2019 22:44    Procedures Procedures (including critical care time)  Medications Ordered in ED Medications  iohexol (OMNIPAQUE) 350 MG/ML injection 100 mL (100 mLs Intravenous Contrast Given 05/23/19 2119)  0.9 %  sodium chloride infusion (250 mLs Intravenous New Bag/Given 05/23/19 2201)     Initial Impression / Assessment and Plan / ED Course  I have reviewed the triage vital signs and the nursing notes.  Pertinent labs & imaging results that were available during my care of the patient were reviewed by me and considered in my medical decision making (see chart for details).  Clinical Course as of May 22 2332  Sat May 23, 2019  2155 Currently, blood pressure is higher.  213/121.  He maintains normal  mentation.  He still complains of right-sided neck pain.  Cardiac monitor with primarily bigeminy, perfusing only 9 PVC beats, with effective pulse of 48 bpm.  We will continue to observe and await CT Angie results.   [EW]  2156 Normal  Troponin I (High Sensitivity) [EW]  2156 Normal  CBC with Differential [EW]  2156 Normal except glucose high  Basic metabolic panel(!) [EW]    Clinical Course User Index [EW] Daleen Bo, MD        Patient Vitals for the past 24 hrs:  BP Temp Temp src Pulse Resp SpO2 Height Weight  05/23/19 2300 (!) 224/93 - - - 17 - - -  05/23/19 2245 - - - (!) 48 14 98 % - -  05/23/19 2240 - - - (!) 55 14 96 % - -  05/23/19 2230 (!) 199/73 - - - 16 - - -  05/23/19 2215 - - - 85 14 98 % - -  05/23/19 2200 (!) 181/86 - - (!) 50 17 95 % - -  05/23/19 2137 (!) 213/121 - - 92 18 98 % - -  05/23/19 2102 (!) 197/79 - - (!) 46 16 97 % - -  05/23/19 1617 - - - - - - 6\' 1"  (1.854 m) 96.6 kg  05/23/19 1616 (!) 197/95 97.7 F (36.5 C) Oral (!) 46 16 99 % 6\' 1"  (1.854 m) 96.6 kg    11:20 PM Reevaluation with update and discussion. After initial assessment and treatment, an updated evaluation reveals no change in clinical status, cardiac monitor continues to show frequent PVCs sometimes in a pattern of bigeminy.  Blood pressure somewhat better at this time, 199/73. Daleen Bo   Medical Decision Making: Patient presenting with concern of neck pain as well as high blood pressure.  Screening done to evaluate for vascular dissection neck; which was normal.  Significant arthritis, cervical and upper thoracic, likely source for his right-sided neck pain.  Doubt hypertensive urgency, metabolic instability impending vascular collapse.  Patient is currently on amlodipine 5 mg, and may benefit from increasing this to 10 mg a day.  Frequent PVCs, with effective heart rate around 50.  This needs to be taken into account for modification of blood pressure medications.  Patient stable  for discharge.  Doubt impending vascular collapse, ACS, or sepsis.  CRITICAL CARE-no Performed by: Daleen Bo  Nursing Notes Reviewed/ Care Coordinated Applicable Imaging Reviewed Interpretation of Laboratory Data incorporated into ED treatment  The patient appears reasonably screened and/or stabilized for discharge and I doubt any other medical condition or other [facility] requiring further screening, evaluation, or treatment in the ED at this time prior to discharge.  Plan: Home Medications-continue usual; Home Treatments-heat to affected area neck; return here if the recommended treatment, does not improve the symptoms; Recommended follow up-PCP checkup 1 week and as needed.   Final Clinical Impressions(s) / ED Diagnoses   Final diagnoses:  Hypertension, unspecified type  Spondylosis of cervical region without myelopathy or radiculopathy    ED Discharge Orders         Ordered    predniSONE (DELTASONE) 20 MG tablet  2 times daily     05/23/19 2329           Daleen Bo, MD 05/23/19 2334

## 2019-05-23 NOTE — Discharge Instructions (Addendum)
The numbness of your neck is from arthritis, and can be best treated with prednisone for 5 days, and frequent heat applications using a heating pad to the area of discomfort.  Your blood pressure is elevated today, and we recommend increasing the amlodipine, from 5 mg to 10 mg a day, until you see your doctor for a checkup next week.  Return here if needed for problems or concerns.

## 2019-06-15 ENCOUNTER — Ambulatory Visit (HOSPITAL_COMMUNITY): Payer: Medicare Other

## 2019-06-15 ENCOUNTER — Ambulatory Visit: Payer: Medicare Other | Admitting: Surgery

## 2019-06-16 ENCOUNTER — Encounter: Payer: Self-pay | Admitting: Family Medicine

## 2019-07-15 ENCOUNTER — Other Ambulatory Visit: Payer: Self-pay

## 2019-07-15 ENCOUNTER — Telehealth (HOSPITAL_COMMUNITY): Payer: Self-pay | Admitting: *Deleted

## 2019-07-15 DIAGNOSIS — I6523 Occlusion and stenosis of bilateral carotid arteries: Secondary | ICD-10-CM

## 2019-07-15 NOTE — Telephone Encounter (Signed)

## 2019-07-20 ENCOUNTER — Other Ambulatory Visit: Payer: Self-pay

## 2019-07-20 ENCOUNTER — Ambulatory Visit (HOSPITAL_COMMUNITY)
Admission: RE | Admit: 2019-07-20 | Discharge: 2019-07-20 | Disposition: A | Discharge status: Home / Self Care | Payer: Medicare Other | Source: Ambulatory Visit | Attending: Surgery | Admitting: Surgery

## 2019-07-20 ENCOUNTER — Ambulatory Visit: Payer: Medicare Other | Admitting: Surgery

## 2019-07-20 ENCOUNTER — Encounter: Payer: Self-pay | Admitting: Surgery

## 2019-07-20 VITALS — BP 148/68 | HR 61 | Temp 97.7°F | Resp 20 | Ht 73.0 in | Wt 214.7 lb

## 2019-07-20 DIAGNOSIS — I6523 Occlusion and stenosis of bilateral carotid arteries: Secondary | ICD-10-CM | POA: Diagnosis not present

## 2019-07-20 NOTE — Progress Notes (Signed)
Vascular and Vein Specialist of [facility]  Patient name: Darius Baxter MRN: [id number] DOB: 16-Mar-1934 Sex: male   REASON FOR VISIT:    Follow up  HISOTRY OF PRESENT ILLNESS:   Darius Hedges Bullinsis a 85 y.o.malewho is status post left carotid endarterectomy with bovine pericardial patch angioplasty on 02/23/2016. This was done for symptomatic left carotid stenosis. The patient was having amaurosis fugax in the left eye prior to his operation. Intraoperative findings included a 90% stenosis with a ruptured plaque at the bifurcation with soft plaque.  He was later found to have progression of the right-sided stenosis to greater than 80%.  He was asymptomatic.  Therefore on 06/11/2018 he underwent right carotid endarterectomy with bovine pericardial patch angioplasty.  Intraoperative findings included a low bifurcation.  He had a ulcerated plaque with mobile debris.  His postoperative course was uncomplicated.   He is back today for follow-up.  He reports no neurologic issues.  Specifically he denies numbness or weakness in either extremity.  He denies slurred speech.  He denies amaurosis fugax.  He is medically managed for hypertension, including an ACE inhibitor. He is on a statin for hyperlipidemia. He is a non-smoker.   PAST MEDICAL HISTORY:   Past Medical History:  Diagnosis Date  . Arthritis   . Carotid artery disease ([place])   . Chronic lower back pain   . Coronary artery disease    Mild at cardiac catheterization 2013  . Essential hypertension   . History of blood transfusion 2008; 2010  . Mitral valve prolapse    Mild mitral regurgitation  . Mixed hyperlipidemia   . Myocardial infarction ([place])    2013  . Nocturia   . Renal cancer [facility])    Status post right nephrectomy - 09/2006  . Seizures ([place])    Childhood-85 yrs old "passed out"  . Takotsubo cardiomyopathy    February 2013  . Ventricular ectopy    Bigeminy      FAMILY HISTORY:   Family History  Problem Relation Age of Onset  . Cancer Other   . Stroke Other   . Coronary artery disease Other   . Hypertension Other   . Hypertension Mother   . Peripheral Artery Disease Mother        had leg bypass, and died after surgery; believed to be from throwing a clot  . CVA Father   . Prostate cancer Father   . Anesthesia problems Neg Hx   . Hypotension Neg Hx   . Malignant hyperthermia Neg Hx   . Pseudochol deficiency Neg Hx     SOCIAL HISTORY:   Social History   Tobacco Use  . Smoking status: Never Smoker  . Smokeless tobacco: Never Used  Substance Use Topics  . Alcohol use: No    Alcohol/week: 0.0 standard drinks     ALLERGIES:   Allergies  Allergen Reactions  . Aleve [Naproxen Sodium] Other (See Comments)    Patient has only 1 kidney and Doctor  Doesn't want him to take  . Vicodin [Hydrocodone-Acetaminophen] Nausea And Vomiting    IV only; no problems taking oral tablet     CURRENT MEDICATIONS:   Current Outpatient Medications  Medication Sig Dispense Refill  . alfuzosin (UROXATRAL) 10 MG 24 hr tablet Take 10 mg by mouth daily with breakfast.     . amLODipine (NORVASC) 10 MG tablet Take 10 mg by mouth daily.    . Artificial Tear Solution (SOOTHE XP OP) Place 1 drop into both  eyes 3 (three) times daily.    Marland Kitchen aspirin EC 81 MG tablet Take 1 tablet (81 mg total) by mouth daily. 30 tablet 3  . atorvastatin (LIPITOR) 40 MG tablet Take 40 mg by mouth daily.    . carvedilol (COREG) 6.25 MG tablet Take 6.25 mg by mouth 2 (two) times daily with a meal.    . Cholecalciferol (VITAMIN D) 2000 units tablet Take 2,000 Units by mouth daily.    . diphenhydramine-acetaminophen (TYLENOL PM) 25-500 MG TABS tablet Take 2 tablets by mouth at bedtime.     . furosemide (LASIX) 20 MG tablet Take 20 mg by mouth daily.     Marland Kitchen HYDROcodone-acetaminophen (LORTAB) 7.5-500 MG per tablet Take 2 tablets by mouth 2 (two) times a week. Wednesday and Sunday  mornings    . lisinopril (PRINIVIL,ZESTRIL) 40 MG tablet Take 40 mg by mouth every evening.    Marland Kitchen omeprazole (PRILOSEC) 20 MG capsule Take 20 mg by mouth daily.    . predniSONE (DELTASONE) 20 MG tablet Take 1 tablet (20 mg total) by mouth 2 (two) times daily. 10 tablet 0   No current facility-administered medications for this visit.     REVIEW OF SYSTEMS:   [X]  denotes positive finding, [ ]  denotes negative finding Cardiac  Comments:  Chest pain or chest pressure:    Shortness of breath upon exertion:    Short of breath when lying flat:    Irregular heart rhythm: x       Vascular    Pain in calf, thigh, or hip brought on by ambulation:    Pain in feet at night that wakes you up from your sleep:     Blood clot in your veins:    Leg swelling:         Pulmonary    Oxygen at home:    Productive cough:     Wheezing:         Neurologic    Sudden weakness in arms or legs:     Sudden numbness in arms or legs:     Sudden onset of difficulty speaking or slurred speech:    Temporary loss of vision in one eye:     Problems with dizziness:         Gastrointestinal    Blood in stool:     Vomited blood:         Genitourinary    Burning when urinating:     Blood in urine:        Psychiatric    Major depression:         Hematologic    Bleeding problems:    Problems with blood clotting too easily:        Skin    Rashes or ulcers:        Constitutional    Fever or chills:      PHYSICAL EXAM:   Vitals:   07/20/19 1104 07/20/19 1110  BP: (!) 150/74 (!) 148/68  Pulse: 61   Resp: 20   Temp: 97.7 F (36.5 C)   SpO2: 97%   Weight: 214 lb 11.2 oz (97.4 kg)   Height: 6\' 1"  (1.854 m)     GENERAL: The patient is a well-nourished male, in no acute distress. The vital signs are documented above. CARDIAC: There is a regular rate and rhythm.  PULMONARY: Non-labored respirations MUSCULOSKELETAL: There are no major deformities or cyanosis. NEUROLOGIC: No focal weakness or  paresthesias are detected. SKIN: There are no ulcers  or rashes noted. PSYCHIATRIC: The patient has a normal affect.  STUDIES:   I have reviewed his duplex with the following findings: Right Carotid: Velocities in the right ICA are consistent with a 1-39% stenosis.                Non-hemodynamically significant plaque <50% noted in the CCA. The                ECA appears <50% stenosed. Patent right carotid endarterecotomy                with non-hemodynamically significant plaque noted.  Left Carotid: Velocities in the left ICA are consistent with a 1-39% stenosis.               Non-hemodynamically significant plaque <50% noted in the CCA. The               ECA appears <50% stenosed. Patent left carotid endarterectomy with               non-hemodynamically significant plaque noted.  Vertebrals:  Bilateral vertebral arteries demonstrate antegrade flow. Subclavians: Normal flow hemodynamics were seen in bilateral subclavian              arteries.   MEDICAL ISSUES:   Carotid stenosis: Both carotid endarterectomy sites are widely patent by duplex.  He remains neurologically intact.  He will be placed on surveillance protocol with follow-up ultrasound in 1 year.  He will continue medication for hypertension and hypercholesterolemia.  He remains on 81 mg aspirin for antiplatelet therapy.    Leia Alf, MD, FACS Vascular and Vein Specialists of [facility] [phone] Pager [phone]

## 2019-08-05 ENCOUNTER — Other Ambulatory Visit: Payer: Self-pay

## 2019-08-05 DIAGNOSIS — I6523 Occlusion and stenosis of bilateral carotid arteries: Secondary | ICD-10-CM

## 2019-08-10 ENCOUNTER — Other Ambulatory Visit: Payer: Self-pay | Admitting: *Deleted

## 2019-08-10 DIAGNOSIS — I6523 Occlusion and stenosis of bilateral carotid arteries: Secondary | ICD-10-CM

## 2019-08-25 DIAGNOSIS — I10 Essential (primary) hypertension: Secondary | ICD-10-CM | POA: Diagnosis not present

## 2019-08-25 DIAGNOSIS — I509 Heart failure, unspecified: Secondary | ICD-10-CM | POA: Diagnosis not present

## 2019-08-25 DIAGNOSIS — Z299 Encounter for prophylactic measures, unspecified: Secondary | ICD-10-CM | POA: Diagnosis not present

## 2019-08-25 DIAGNOSIS — E7800 Pure hypercholesterolemia, unspecified: Secondary | ICD-10-CM | POA: Diagnosis not present

## 2019-08-25 DIAGNOSIS — R1310 Dysphagia, unspecified: Secondary | ICD-10-CM | POA: Diagnosis not present

## 2019-08-25 DIAGNOSIS — I429 Cardiomyopathy, unspecified: Secondary | ICD-10-CM | POA: Diagnosis not present

## 2019-08-25 DIAGNOSIS — Z789 Other specified health status: Secondary | ICD-10-CM | POA: Diagnosis not present

## 2019-09-16 DIAGNOSIS — R1310 Dysphagia, unspecified: Secondary | ICD-10-CM | POA: Diagnosis not present

## 2019-09-18 DIAGNOSIS — I10 Essential (primary) hypertension: Secondary | ICD-10-CM | POA: Diagnosis not present

## 2019-09-18 DIAGNOSIS — I429 Cardiomyopathy, unspecified: Secondary | ICD-10-CM | POA: Diagnosis not present

## 2019-09-18 DIAGNOSIS — Z299 Encounter for prophylactic measures, unspecified: Secondary | ICD-10-CM | POA: Diagnosis not present

## 2019-09-18 DIAGNOSIS — I700 Atherosclerosis of aorta: Secondary | ICD-10-CM | POA: Diagnosis not present

## 2019-09-18 DIAGNOSIS — K222 Esophageal obstruction: Secondary | ICD-10-CM | POA: Diagnosis not present

## 2019-09-22 ENCOUNTER — Encounter: Payer: Self-pay | Admitting: Gastroenterology

## 2019-09-23 ENCOUNTER — Telehealth: Payer: Self-pay | Admitting: *Deleted

## 2019-09-23 NOTE — Telephone Encounter (Signed)
Telephone call received on 09/18/2019 - patient had visit with pmd Bernardo Heater, [place]) - concerned about patient's low heart rate, stating it was 33, but she got it up to 66.  Was concerned that he may need a pacemaker.    Call placed to patient that evening - he has been asymptomatic and no heart symptoms since last office visit on 04/24/2019.    Office note & EKG received for provider review.  (see scanned into epic)  Per Dr. Domenic Polite review on 09/22/2019 - he reviewed the ekg - suspect his bradycardia may have been undercounted heart rate (by half) in the setting of ventricular bigeminy.

## 2019-10-02 DIAGNOSIS — K222 Esophageal obstruction: Secondary | ICD-10-CM | POA: Diagnosis not present

## 2019-10-02 DIAGNOSIS — I700 Atherosclerosis of aorta: Secondary | ICD-10-CM | POA: Diagnosis not present

## 2019-10-02 DIAGNOSIS — I10 Essential (primary) hypertension: Secondary | ICD-10-CM | POA: Diagnosis not present

## 2019-10-02 DIAGNOSIS — I509 Heart failure, unspecified: Secondary | ICD-10-CM | POA: Diagnosis not present

## 2019-10-02 DIAGNOSIS — Z299 Encounter for prophylactic measures, unspecified: Secondary | ICD-10-CM | POA: Diagnosis not present

## 2019-10-08 ENCOUNTER — Ambulatory Visit: Payer: Medicare Other | Admitting: Gastroenterology

## 2019-10-15 ENCOUNTER — Other Ambulatory Visit: Payer: Self-pay

## 2019-10-15 ENCOUNTER — Telehealth: Payer: Self-pay

## 2019-10-15 ENCOUNTER — Ambulatory Visit: Payer: Medicare Other | Admitting: Gastroenterology

## 2019-10-15 ENCOUNTER — Encounter: Payer: Self-pay | Admitting: Gastroenterology

## 2019-10-15 DIAGNOSIS — R1310 Dysphagia, unspecified: Secondary | ICD-10-CM | POA: Insufficient documentation

## 2019-10-15 NOTE — Patient Instructions (Addendum)
FOLLOW A CLEAR LIQUID DIET. NOTHING TO EAT OR DRINK AFTER MIDNIGHT. IT IS OK TO TAKE YOUR BLOOD PRESSURE PILLS.  COMPLETE  UPPER ENDOSCOPY TO STRETCH YOUR ESOPHAGUS FEB 26.  FOLLOW UP IN 4 MOS.

## 2019-10-15 NOTE — Assessment & Plan Note (Signed)
SYMPTOMS NOT IDEALLY CONTROLLED. DIFFERENTIAL DIAGNOSIS INCLUDES: PEPTIC STRICTURE, PRIMARY ESOPHAGEAL MOTILITY DISORDER,OR 2o ESOPHAGEAL MOTILITY DISORDER DUE TO UNCONTROLLED REFLUX, LESS LIKLEY ESOPHAGEAL CANCER.  FOLLOW A CLEAR LIQUID DIET. NOTHING TO EAT OR DRINK AFTER MIDNIGHT. IT IS OK TO TAKE YOUR BLOOD PRESSURE PILLS. COMPLETE  UPPER ENDOSCOPY TO STRETCH YOUR ESOPHAGUS FEB 26.  DISCUSSED PROCEDURE, BENEFITS, & RISKS: < 1% chance of medication reaction, bleeding, perforation, or ASPIRATION. FOLLOW UP IN 4 MOS.

## 2019-10-15 NOTE — Progress Notes (Signed)
Subjective:    Patient ID: Darius Baxter, male    DOB: [birth year], 86 y.o.   MRN: [id number]  Monico Blitz, MD  HPI Trouble with swallowing for 9-10 weeks. Had MBS and speech path recommended GI REFERRAL. START EATING AND STARTS HURTING ACROSS TOP ABDOMEN AND UNDER HIS RIBS. HAS HAD TO VOMIT TO RELIVE UPPER ABDOMEN. DOESN'T HAPPEN EVERY TIME. PAIN IN UPPER ABDOMEN IS CONSTANT. PAIN IS JUST A LITTLE BIT BETTER BECAUSE IF HE CHANGED HIS DIET. NEVER EGD. TCS A WHILE BACK. BMs: BSC #1, THEN BSC #4 NL STOOL. LOST ABOUT 10 LBS SINCE THIS STARTED.  PT DENIES FEVER, CHILLS, HEMATOCHEZIA, HEMATEMESIS, nausea, vomiting, melena, diarrhea, CHEST PAIN, SHORTNESS OF BREATH,  CHANGE IN BOWEL IN HABITS, problems with sedation, OR heartburn or indigestion.  Past Medical History:  Diagnosis Date  . Arthritis   . Carotid artery disease ([place])   . Chronic lower back pain   . Coronary artery disease    Mild at cardiac catheterization 2013  . Essential hypertension   . History of blood transfusion 2008; 2010  . Mitral valve prolapse    Mild mitral regurgitation  . Mixed hyperlipidemia   . Myocardial infarction ([place])    2013  . Nocturia   . Renal cancer [facility])    Status post right nephrectomy - 09/2006  . Seizures ([place])    Childhood-86 yrs old "passed out"  . Takotsubo cardiomyopathy    February 2013  . Ventricular ectopy    Bigeminy    Past Surgical History:  Procedure Laterality Date  . APPENDECTOMY    . CARDIAC CATHETERIZATION    . CAROTID ENDARTERECTOMY Left 02/23/2016  . CAROTID ENDARTERECTOMY Right 06/11/2018   PATCH ANGIOPLASTY WITH XENOSURE PATCH  . CATARACT EXTRACTION W/PHACO  03/22/2011   Procedure: CATARACT EXTRACTION PHACO AND INTRAOCULAR LENS PLACEMENT (IOC);  Surgeon: Tonny Branch;  Location: [facility];  Service: Ophthalmology;  Laterality: Right;  CDE  19.88  . CATARACT EXTRACTION W/PHACO  04/16/2011   Procedure: CATARACT EXTRACTION PHACO AND INTRAOCULAR LENS PLACEMENT (IOC);  Surgeon:  Tonny Branch;  Location: [facility];  Service: Ophthalmology;  Laterality: Left;  CDE 21.13  . CHOLECYSTECTOMY  01/29/2012   Procedure: LAPAROSCOPIC CHOLECYSTECTOMY WITH INTRAOPERATIVE CHOLANGIOGRAM;  Surgeon: Joyice Faster. Cornett, MD;  Location: [place];  Service: General;  Laterality: N/A;  . ENDARTERECTOMY Left 02/23/2016   Procedure: LEFT CAROTID ENDARTERECTOMY WITH XENOSURE BOVINE PERICARDIUM PATCH ANGIOPLASTY;  Surgeon: Serafina Mitchell, MD;  Location: [place];  Service: Vascular;  Laterality: Left;  . ENDARTERECTOMY Right 06/11/2018   Procedure: ENDARTERECTOMY CAROTID RIGHT;  Surgeon: Serafina Mitchell, MD;  Location: [place];  Service: Vascular;  Laterality: Right;  . JOINT REPLACEMENT    . KNEE ARTHROSCOPY Right   . LITHOTRIPSY    . NEPHRECTOMY Right 2008  . PATCH ANGIOPLASTY Right 06/11/2018   Procedure: PATCH ANGIOPLASTY WITH Weyman Pedro;  Surgeon: Serafina Mitchell, MD;  Location: [facility] OR;  Service: Vascular;  Laterality: Right;  . TOTAL KNEE ARTHROPLASTY  2010  . TOTAL KNEE ARTHROPLASTY Left 2014   Allergies  Allergen Reactions  . Aleve [Naproxen Sodium] Other (See Comments)    Patient has only 1 kidney and Doctor  Doesn't want him to take  . Vicodin [Hydrocodone-Acetaminophen] Nausea And Vomiting    IV only; no problems taking oral tablet    Current Outpatient Medications  Medication Sig    . amLODipine (NORVASC) 10 MG tablet Take 10 mg by mouth daily.    Marland Kitchen  Artificial Tear Solution (SOOTHE XP OP) Place 1 drop into both eyes 3 (three) times daily.    Marland Kitchen aspirin EC 81 MG tablet Take 1 tablet (81 mg total) by mouth daily.    Marland Kitchen atorvastatin (LIPITOR) 40 MG tablet Take 40 mg by mouth daily.    . baclofen (LIORESAL) 10 MG tablet Take 10 mg by mouth 3 (three) times daily.    . carvedilol (COREG) 6.25 MG tablet Take 6.25 mg by mouth 2 (two) times daily with a meal.    . Cholecalciferol (VITAMIN D) 2000 units tablet Take 2,000 Units by mouth daily.    . Cholecalciferol (VITAMIN D) 50 MCG (2000 UT)  tablet Take 2,000 Units by mouth daily.    . furosemide (LASIX) 20 MG tablet Take 20 mg by mouth daily.     Marland Kitchen HYDROcodone-acetaminophen (LORTAB) 7.5-500 MG per tablet Take 2 tablets by mouth 2 (two) times a week. Wednesday and Sunday mornings    . HYDROcodone-acetaminophen (NORCO) 7.5-325 MG tablet Take 1 tablet by mouth every 6 (six) hours as needed for moderate pain. PT said he might take 2 or 3 tablets weekly    . olmesartan (BENICAR) 40 MG tablet Take 40 mg by mouth daily.    . pantoprazole (PROTONIX) 40 MG tablet Take 40 mg by mouth daily.    Marland Kitchen alfuzosin (UROXATRAL) 10 MG 24 hr tablet Take 10 mg by mouth daily with breakfast.     . diphenhydramine-acetaminophen (TYLENOL PM) 25-500 MG TABS tablet Take 2 tablets by mouth at bedtime.     .      .      .       Family History  Problem Relation Age of Onset  . Cancer Other   . Stroke Other   . Coronary artery disease Other   . Hypertension Other   . Hypertension Mother   . Peripheral Artery Disease Mother        had leg bypass, and died after surgery; believed to be from throwing a clot  . CVA Father   . Prostate cancer Father   . Anesthesia problems Neg Hx   . Hypotension Neg Hx   . Malignant hyperthermia Neg Hx   . Pseudochol deficiency Neg Hx     Social History   Tobacco Use  . Smoking status: Never Smoker  . Smokeless tobacco: Never Used  Substance Use Topics  . Alcohol use: No    Alcohol/week: 0.0 standard drinks  . Drug use: No   Review of Systems PER HPI OTHERWISE ALL SYSTEMS ARE NEGATIVE.    Objective:   Physical Exam Constitutional:      General: He is not in acute distress.    Appearance: Normal appearance.  HENT:     Mouth/Throat:     Comments: MASK IN PLACE Eyes:     General: No scleral icterus.    Pupils: Pupils are equal, round, and reactive to light.  Cardiovascular:     Rate and Rhythm: Normal rate and regular rhythm.     Pulses: Normal pulses.     Heart sounds: Normal heart sounds.  Pulmonary:       Effort: Pulmonary effort is normal.     Breath sounds: Normal breath sounds.  Abdominal:     General: Bowel sounds are normal.     Palpations: Abdomen is soft.     Tenderness: There is no abdominal tenderness.  Musculoskeletal:     Cervical back: Normal range of motion.  Right lower leg: No edema.     Left lower leg: No edema.  Lymphadenopathy:     Cervical: No cervical adenopathy.  Skin:    General: Skin is warm and dry.  Neurological:     Mental Status: He is alert and oriented to person, place, and time.     Comments: NO  NEW FOCAL DEFICITS  Psychiatric:        Mood and Affect: Mood normal.     Comments: NORMAL AFFECT       Assessment & Plan:

## 2019-10-15 NOTE — H&P (View-Only) (Signed)
Subjective:    Patient ID: Darius Baxter, male    DOB: 1933-10-12, 86 y.o.   MRN: [id number]  Monico Blitz, MD  HPI Trouble with swallowing for 9-10 weeks. Had MBS and speech path recommended GI REFERRAL. START EATING AND STARTS HURTING ACROSS TOP ABDOMEN AND UNDER HIS RIBS. HAS HAD TO VOMIT TO RELIVE UPPER ABDOMEN. DOESN'T HAPPEN EVERY TIME. PAIN IN UPPER ABDOMEN IS CONSTANT. PAIN IS JUST A LITTLE BIT BETTER BECAUSE IF HE CHANGED HIS DIET. NEVER EGD. TCS A WHILE BACK. BMs: BSC #1, THEN BSC #4 NL STOOL. LOST ABOUT 10 LBS SINCE THIS STARTED.  PT DENIES FEVER, CHILLS, HEMATOCHEZIA, HEMATEMESIS, nausea, vomiting, melena, diarrhea, CHEST PAIN, SHORTNESS OF BREATH,  CHANGE IN BOWEL IN HABITS, problems with sedation, OR heartburn or indigestion.  Past Medical History:  Diagnosis Date  . Arthritis   . Carotid artery disease ([place])   . Chronic lower back pain   . Coronary artery disease    Mild at cardiac catheterization 2013  . Essential hypertension   . History of blood transfusion 2008; 2010  . Mitral valve prolapse    Mild mitral regurgitation  . Mixed hyperlipidemia   . Myocardial infarction ([place])    2013  . Nocturia   . Renal cancer [facility])    Status post right nephrectomy - 09/2006  . Seizures ([place])    Childhood-86 yrs old "passed out"  . Takotsubo cardiomyopathy    February 2013  . Ventricular ectopy    Bigeminy    Past Surgical History:  Procedure Laterality Date  . APPENDECTOMY    . CARDIAC CATHETERIZATION    . CAROTID ENDARTERECTOMY Left 02/23/2016  . CAROTID ENDARTERECTOMY Right 06/11/2018   PATCH ANGIOPLASTY WITH XENOSURE PATCH  . CATARACT EXTRACTION W/PHACO  03/22/2011   Procedure: CATARACT EXTRACTION PHACO AND INTRAOCULAR LENS PLACEMENT (IOC);  Surgeon: Tonny Branch;  Location: [facility];  Service: Ophthalmology;  Laterality: Right;  CDE  19.88  . CATARACT EXTRACTION W/PHACO  04/16/2011   Procedure: CATARACT EXTRACTION PHACO AND INTRAOCULAR LENS PLACEMENT (IOC);  Surgeon:  Tonny Branch;  Location: [facility];  Service: Ophthalmology;  Laterality: Left;  CDE 21.13  . CHOLECYSTECTOMY  01/29/2012   Procedure: LAPAROSCOPIC CHOLECYSTECTOMY WITH INTRAOPERATIVE CHOLANGIOGRAM;  Surgeon: Joyice Faster. Cornett, MD;  Location: [place];  Service: General;  Laterality: N/A;  . ENDARTERECTOMY Left 02/23/2016   Procedure: LEFT CAROTID ENDARTERECTOMY WITH XENOSURE BOVINE PERICARDIUM PATCH ANGIOPLASTY;  Surgeon: Serafina Mitchell, MD;  Location: [place];  Service: Vascular;  Laterality: Left;  . ENDARTERECTOMY Right 06/11/2018   Procedure: ENDARTERECTOMY CAROTID RIGHT;  Surgeon: Serafina Mitchell, MD;  Location: [place];  Service: Vascular;  Laterality: Right;  . JOINT REPLACEMENT    . KNEE ARTHROSCOPY Right   . LITHOTRIPSY    . NEPHRECTOMY Right 2008  . PATCH ANGIOPLASTY Right 06/11/2018   Procedure: PATCH ANGIOPLASTY WITH Weyman Pedro;  Surgeon: Serafina Mitchell, MD;  Location: [facility] OR;  Service: Vascular;  Laterality: Right;  . TOTAL KNEE ARTHROPLASTY  2010  . TOTAL KNEE ARTHROPLASTY Left 2014   Allergies  Allergen Reactions  . Aleve [Naproxen Sodium] Other (See Comments)    Patient has only 1 kidney and Doctor  Doesn't want him to take  . Vicodin [Hydrocodone-Acetaminophen] Nausea And Vomiting    IV only; no problems taking oral tablet    Current Outpatient Medications  Medication Sig    . amLODipine (NORVASC) 10 MG tablet Take 10 mg by mouth daily.    Marland Kitchen  Artificial Tear Solution (SOOTHE XP OP) Place 1 drop into both eyes 3 (three) times daily.    Marland Kitchen aspirin EC 81 MG tablet Take 1 tablet (81 mg total) by mouth daily.    Marland Kitchen atorvastatin (LIPITOR) 40 MG tablet Take 40 mg by mouth daily.    . baclofen (LIORESAL) 10 MG tablet Take 10 mg by mouth 3 (three) times daily.    . carvedilol (COREG) 6.25 MG tablet Take 6.25 mg by mouth 2 (two) times daily with a meal.    . Cholecalciferol (VITAMIN D) 2000 units tablet Take 2,000 Units by mouth daily.    . Cholecalciferol (VITAMIN D) 50 MCG (2000 UT)  tablet Take 2,000 Units by mouth daily.    . furosemide (LASIX) 20 MG tablet Take 20 mg by mouth daily.     Marland Kitchen HYDROcodone-acetaminophen (LORTAB) 7.5-500 MG per tablet Take 2 tablets by mouth 2 (two) times a week. Wednesday and Sunday mornings    . HYDROcodone-acetaminophen (NORCO) 7.5-325 MG tablet Take 1 tablet by mouth every 6 (six) hours as needed for moderate pain. PT said he might take 2 or 3 tablets weekly    . olmesartan (BENICAR) 40 MG tablet Take 40 mg by mouth daily.    . pantoprazole (PROTONIX) 40 MG tablet Take 40 mg by mouth daily.    Marland Kitchen alfuzosin (UROXATRAL) 10 MG 24 hr tablet Take 10 mg by mouth daily with breakfast.     . diphenhydramine-acetaminophen (TYLENOL PM) 25-500 MG TABS tablet Take 2 tablets by mouth at bedtime.     .      .      .       Family History  Problem Relation Age of Onset  . Cancer Other   . Stroke Other   . Coronary artery disease Other   . Hypertension Other   . Hypertension Mother   . Peripheral Artery Disease Mother        had leg bypass, and died after surgery; believed to be from throwing a clot  . CVA Father   . Prostate cancer Father   . Anesthesia problems Neg Hx   . Hypotension Neg Hx   . Malignant hyperthermia Neg Hx   . Pseudochol deficiency Neg Hx     Social History   Tobacco Use  . Smoking status: Never Smoker  . Smokeless tobacco: Never Used  Substance Use Topics  . Alcohol use: No    Alcohol/week: 0.0 standard drinks  . Drug use: No   Review of Systems PER HPI OTHERWISE ALL SYSTEMS ARE NEGATIVE.    Objective:   Physical Exam Constitutional:      General: He is not in acute distress.    Appearance: Normal appearance.  HENT:     Mouth/Throat:     Comments: MASK IN PLACE Eyes:     General: No scleral icterus.    Pupils: Pupils are equal, round, and reactive to light.  Cardiovascular:     Rate and Rhythm: Normal rate and regular rhythm.     Pulses: Normal pulses.     Heart sounds: Normal heart sounds.  Pulmonary:      Effort: Pulmonary effort is normal.     Breath sounds: Normal breath sounds.  Abdominal:     General: Bowel sounds are normal.     Palpations: Abdomen is soft.     Tenderness: There is no abdominal tenderness.  Musculoskeletal:     Cervical back: Normal range of motion.  Right lower leg: No edema.     Left lower leg: No edema.  Lymphadenopathy:     Cervical: No cervical adenopathy.  Skin:    General: Skin is warm and dry.  Neurological:     Mental Status: He is alert and oriented to person, place, and time.     Comments: NO  NEW FOCAL DEFICITS  Psychiatric:        Mood and Affect: Mood normal.     Comments: NORMAL AFFECT       Assessment & Plan:

## 2019-10-15 NOTE — Telephone Encounter (Signed)
PA for EGD submitted via [facility] website. Case approved. PA# [id number], valid 10/19/19-01/17/20.

## 2019-10-15 NOTE — Progress Notes (Signed)
Cc'ed to pcp °

## 2019-10-16 ENCOUNTER — Other Ambulatory Visit (HOSPITAL_COMMUNITY)
Admission: RE | Admit: 2019-10-16 | Discharge: 2019-10-16 | Disposition: A | Discharge status: Home / Self Care | Payer: Medicare Other | Source: Ambulatory Visit | Attending: Internal Medicine | Admitting: Internal Medicine

## 2019-10-16 DIAGNOSIS — Z20822 Contact with and (suspected) exposure to covid-19: Secondary | ICD-10-CM | POA: Insufficient documentation

## 2019-10-16 DIAGNOSIS — Z01812 Encounter for preprocedural laboratory examination: Secondary | ICD-10-CM | POA: Insufficient documentation

## 2019-10-16 LAB — SARS CORONAVIRUS 2 (TAT 6-24 HRS): SARS Coronavirus 2: NEGATIVE

## 2019-10-19 ENCOUNTER — Ambulatory Visit (HOSPITAL_COMMUNITY)
Admission: RE | Admit: 2019-10-19 | Discharge: 2019-10-19 | Disposition: A | Discharge status: Home / Self Care | Payer: Medicare Other | Attending: Gastroenterology | Admitting: Gastroenterology

## 2019-10-19 ENCOUNTER — Other Ambulatory Visit: Payer: Self-pay

## 2019-10-19 ENCOUNTER — Encounter (HOSPITAL_COMMUNITY)
Admission: RE | Disposition: A | Discharge status: Home / Self Care | Payer: Self-pay | Source: Home / Self Care | Attending: Gastroenterology

## 2019-10-19 ENCOUNTER — Encounter (HOSPITAL_COMMUNITY): Payer: Self-pay | Admitting: Gastroenterology

## 2019-10-19 DIAGNOSIS — K2950 Unspecified chronic gastritis without bleeding: Secondary | ICD-10-CM | POA: Insufficient documentation

## 2019-10-19 DIAGNOSIS — K319 Disease of stomach and duodenum, unspecified: Secondary | ICD-10-CM | POA: Insufficient documentation

## 2019-10-19 DIAGNOSIS — Z7982 Long term (current) use of aspirin: Secondary | ICD-10-CM | POA: Insufficient documentation

## 2019-10-19 DIAGNOSIS — Z79899 Other long term (current) drug therapy: Secondary | ICD-10-CM | POA: Insufficient documentation

## 2019-10-19 DIAGNOSIS — I341 Nonrheumatic mitral (valve) prolapse: Secondary | ICD-10-CM | POA: Diagnosis not present

## 2019-10-19 DIAGNOSIS — K3189 Other diseases of stomach and duodenum: Secondary | ICD-10-CM | POA: Diagnosis not present

## 2019-10-19 DIAGNOSIS — K222 Esophageal obstruction: Secondary | ICD-10-CM | POA: Diagnosis not present

## 2019-10-19 DIAGNOSIS — Z885 Allergy status to narcotic agent status: Secondary | ICD-10-CM | POA: Diagnosis not present

## 2019-10-19 DIAGNOSIS — I2510 Atherosclerotic heart disease of native coronary artery without angina pectoris: Secondary | ICD-10-CM | POA: Insufficient documentation

## 2019-10-19 DIAGNOSIS — Z8249 Family history of ischemic heart disease and other diseases of the circulatory system: Secondary | ICD-10-CM | POA: Diagnosis not present

## 2019-10-19 DIAGNOSIS — I10 Essential (primary) hypertension: Secondary | ICD-10-CM | POA: Diagnosis not present

## 2019-10-19 DIAGNOSIS — R1310 Dysphagia, unspecified: Secondary | ICD-10-CM | POA: Diagnosis present

## 2019-10-19 DIAGNOSIS — Z905 Acquired absence of kidney: Secondary | ICD-10-CM | POA: Insufficient documentation

## 2019-10-19 DIAGNOSIS — Z886 Allergy status to analgesic agent status: Secondary | ICD-10-CM | POA: Insufficient documentation

## 2019-10-19 DIAGNOSIS — K2980 Duodenitis without bleeding: Secondary | ICD-10-CM | POA: Insufficient documentation

## 2019-10-19 DIAGNOSIS — Z85528 Personal history of other malignant neoplasm of kidney: Secondary | ICD-10-CM | POA: Diagnosis not present

## 2019-10-19 DIAGNOSIS — E782 Mixed hyperlipidemia: Secondary | ICD-10-CM | POA: Diagnosis not present

## 2019-10-19 DIAGNOSIS — I252 Old myocardial infarction: Secondary | ICD-10-CM | POA: Diagnosis not present

## 2019-10-19 DIAGNOSIS — M1990 Unspecified osteoarthritis, unspecified site: Secondary | ICD-10-CM | POA: Diagnosis not present

## 2019-10-19 HISTORY — PX: SAVORY DILATION: SHX5439

## 2019-10-19 HISTORY — PX: BIOPSY: SHX5522

## 2019-10-19 HISTORY — PX: ESOPHAGOGASTRODUODENOSCOPY: SHX5428

## 2019-10-19 SURGERY — EGD (ESOPHAGOGASTRODUODENOSCOPY)
Anesthesia: Moderate Sedation

## 2019-10-19 MED ORDER — MIDAZOLAM HCL 5 MG/5ML IJ SOLN
INTRAMUSCULAR | Status: DC | PRN
  Administered 2019-10-19 (×3): 1 mg via INTRAVENOUS

## 2019-10-19 MED ORDER — LIDOCAINE VISCOUS HCL 2 % MT SOLN
OROMUCOSAL | Status: AC
  Filled 2019-10-19: qty 15

## 2019-10-19 MED ORDER — MIDAZOLAM HCL 5 MG/5ML IJ SOLN
INTRAMUSCULAR | Status: AC
  Filled 2019-10-19: qty 10

## 2019-10-19 MED ORDER — MINERAL OIL PO OIL
TOPICAL_OIL | ORAL | Status: AC
  Filled 2019-10-19: qty 30

## 2019-10-19 MED ORDER — MEPERIDINE HCL 100 MG/ML IJ SOLN
INTRAMUSCULAR | Status: DC | PRN
  Administered 2019-10-19 (×2): 25 mg

## 2019-10-19 MED ORDER — MEPERIDINE HCL 100 MG/ML IJ SOLN
INTRAMUSCULAR | Status: AC
  Filled 2019-10-19: qty 2

## 2019-10-19 MED ORDER — LIDOCAINE VISCOUS HCL 2 % MT SOLN
OROMUCOSAL | Status: DC | PRN
Start: 2019-10-19 — End: 2019-10-19
  Administered 2019-10-19: 1 via OROMUCOSAL

## 2019-10-19 MED ORDER — SODIUM CHLORIDE 0.9 % IV SOLN: INTRAVENOUS | Status: DC

## 2019-10-19 NOTE — Discharge Instructions (Signed)
I dilated your esophagus. You have a stricture near the base of your esophagus. IT IS DUE TO UNCONTROLLED ACID REFLUX.  You have mild gastritis & DUODENITIS DUE TO ASPIRIN USE. I biopsied your stomach.    AVOID REFLUX TRIGGERS. SEE INFO BELOW.  DRINK WATER TO KEEP YOUR URINE LIGHT YELLOW.  FOLLOW A LOW FAT DIET. AVOID FRIED AND FAST FOODS. MEATS SHOULD BE BAKED, BROILED, OR BOILED.Marland Kitchen   YOUR BIOPSY RESULTS WILL BE BACK IN 5 BUSINESS DAYS.  PLEASE CALL IN ONE MONTH IF YOUR STILL HAVING PROBLEMS SWALLOWING.  FOLLOW UP IN 4 MOS.  UPPER ENDOSCOPY AFTER CARE Read the instructions outlined below and refer to this sheet in the next week. These discharge instructions provide you with general information on caring for yourself after you leave the hospital. While your treatment has been planned according to the most current medical practices available, unavoidable complications occasionally occur. If you have any problems or questions after discharge, call DR. Liesa Tsan, [phone].  ACTIVITY  You may resume your regular activity, but move at a slower pace for the next 24 hours.   Take frequent rest periods for the next 24 hours.   Walking will help get rid of the air and reduce the bloated feeling in your belly (abdomen).   No driving for 24 hours (because of the medicine (anesthesia) used during the test).   You may shower.   Do not sign any important legal documents or operate any machinery for 24 hours (because of the anesthesia used during the test).    NUTRITION  Drink plenty of fluids.   You may resume your normal diet as instructed by your doctor.   Begin with a light meal and progress to your normal diet. Heavy or fried foods are harder to digest and may make you feel sick to your stomach (nauseated).   Avoid alcoholic beverages for 24 hours or as instructed.    MEDICATIONS  You may resume your normal medications.   WHAT YOU CAN EXPECT TODAY  Some feelings of  bloating in the abdomen.   Passage of more gas than usual.    IF YOU HAD A BIOPSY TAKEN DURING THE UPPER ENDOSCOPY:  Eat a soft diet IF YOU HAVE NAUSEA, BLOATING, ABDOMINAL PAIN, OR VOMITING.    FINDING OUT THE RESULTS OF YOUR TEST Not all test results are available during your visit. DR. Oneida Alar WILL CALL YOU WITHIN 14 DAYS OF YOUR PROCEDUE WITH YOUR RESULTS. Do not assume everything is normal if you have not heard from DR. Cicely Ortner, CALL HER OFFICE AT [phone].  SEEK IMMEDIATE MEDICAL ATTENTION AND CALL THE OFFICE: [phone] IF:  You have more than a spotting of blood in your stool.   Your belly is swollen (abdominal distention).   You are nauseated or vomiting.   You have a temperature over 101F.   You have abdominal pain or discomfort that is severe or gets worse throughout the day.  Gastritis  Gastritis is an inflammation (the body's way of reacting to injury and/or infection) of the stomach. It is often caused by viral or bacterial (germ) infections. It can also be caused BY ASPIRIN, BC/GOODY POWDER'S, (IBUPROFEN) MOTRIN, OR ALEVE (NAPROXEN), chemicals (including alcohol), SPICY FOODS, and medications. This illness may be associated with generalized malaise (feeling tired, not well), UPPER ABDOMINAL STOMACH cramps, and fever. One common bacterial cause of gastritis is an organism known as H. Pylori. This can be treated with antibiotics.   ESOPHAGEAL STRICTURE  Esophageal strictures can  be caused by stomach acid backing up into the tube that carries food from the mouth down to the stomach (lower esophagus).  TREATMENT There are a number of medicines used to treat reflux/stricture, including: Antacids.  Proton-pump inhibitors: PANTPRAZOLE TAGAMET OR PEPCID      Lifestyle and home remedies TO MANAGE REFLUX  You may eliminate or reduce the frequency of heartburn by making the following lifestyle changes:  . Control your weight. Being overweight is a major risk  factor for heartburn and GERD. Excess pounds put pressure on your abdomen, pushing up your stomach and causing acid to back up into your esophagus.   . Eat smaller meals. 4 TO 6 MEALS A DAY. This reduces pressure on the lower esophageal sphincter, helping to prevent the valve from opening and acid from washing back into your esophagus.   Dolphus Jenny your belt. Clothes that fit tightly around your waist put pressure on your abdomen and the lower esophageal sphincter.   . Eliminate heartburn triggers. Everyone has specific triggers. Common triggers such as fatty or fried foods, spicy food, tomato sauce, carbonated beverages, alcohol, chocolate, mint, garlic, onion, caffeine and nicotine may make heartburn worse.   Marland Kitchen Avoid stooping or bending. Tying your shoes is OK. Bending over for longer periods to weed your garden isn't, especially soon after eating.   . Don't lie down after a meal. Wait at least three to four hours after eating before going to bed, and don't lie down right after eating.     Alternative medicine . Several home remedies exist for treating GERD, but they provide only temporary relief. They include drinking baking soda (sodium bicarbonate) added to water or drinking other fluids such as baking soda mixed with cream of tartar and water.  . Although these liquids create temporary relief by neutralizing, washing away or buffering acids, eventually they aggravate the situation by adding gas and fluid to your stomach, increasing pressure and causing more acid reflux. Further, adding more sodium to your diet may increase your blood pressure and add stress to your heart, and excessive bicarbonate ingestion can alter the acid-base balance in your body.

## 2019-10-19 NOTE — Interval H&P Note (Signed)
History and Physical Interval Note:  10/19/2019 10:22 AM  Darius Baxter  has presented today for surgery, with the diagnosis of dysphagia.  The various methods of treatment have been discussed with the patient and family. After consideration of risks, benefits and other options for treatment, the patient has consented to  Procedure(s) with comments: ESOPHAGOGASTRODUODENOSCOPY (EGD) (N/A) - 9:30am SAVORY DILATION (N/A) as a surgical intervention.  The patient's history has been reviewed, patient examined, no change in status, stable for surgery.  I have reviewed the patient's chart and labs.  Questions were answered to the patient's satisfaction.     [employer]

## 2019-10-19 NOTE — Op Note (Signed)
[facility] Patient Name: Darius Baxter Procedure Date: 10/19/2019 10:05 AM MRN: [id number] Date of Birth: [birth year] Attending MD: Barney Drain MD, MD CSN: [id number] Age: 86 Admit Type: Outpatient Procedure:                Upper GI endoscopy WITH COLD FORCEPS                            BIOPSY/ESOPHAGEAL DILATION Indications:              Dysphagia Providers:                Barney Drain MD, MD, Janeece Riggers, RN, Nelma Rothman,                            Technician Referring MD:             Bernardo Heater, FNP Medicines:                Meperidine 50 mg IV, Midazolam 3 mg IV Complications:            No immediate complications. Estimated Blood Loss:     Estimated blood loss was minimal. Procedure:                Pre-Anesthesia Assessment:                           - Prior to the procedure, a History and Physical                            was performed, and patient medications and                            allergies were reviewed. The patient's tolerance of                            previous anesthesia was also reviewed. The risks                            and benefits of the procedure and the sedation                            options and risks were discussed with the patient.                            All questions were answered, and informed consent                            was obtained. Prior Anticoagulants: The patient has                            taken no previous anticoagulant or antiplatelet                            agents except for aspirin. ASA Grade Assessment: II                            -  A patient with mild systemic disease. After                            reviewing the risks and benefits, the patient was                            deemed in satisfactory condition to undergo the                            procedure. After obtaining informed consent, the                            endoscope was passed under direct vision.                            Throughout  the procedure, the patient's blood                            pressure, pulse, and oxygen saturations were                            monitored continuously. The [id number]                            scope was introduced through the mouth, and                            advanced to the duodenal bulb. The upper GI                            endoscopy was accomplished without difficulty. The                            patient tolerated the procedure well. Scope In: 10:33:50 AM Scope Out: 10:45:47 AM Total Procedure Duration: 0 hours 11 minutes 57 seconds  Findings:      One benign-appearing, intrinsic moderate (circumferential scarring or       stenosis; an endoscope may pass) stenosis was found. This stenosis       measured 1.3 cm (inner diameter). The stenosis was traversed. A       guidewire was placed and the scope was withdrawn. Dilation was performed       with a Savary dilator with mild resistance at 12.8 mm and 14 mm and       moderate resistance at 15 mm, 16 mm and 17 mm.      Diffuse moderate inflammation characterized by congestion (edema) and       erythema was found in the gastric body and in the gastric antrum.       Biopsies(2;BODY,1:INCISURA,2:ANTRUM) were taken with a cold forceps for       Helicobacter pylori testing.      Localized mild inflammation characterized by congestion (edema) and       erythema was found in the duodenal bulb.      The second portion of the duodenum was normal. Impression:               -  Benign-appearing esophageal PEPTIC STRICTURE                           - MODERATE Gastritis DUE TO ASA. Biopsied.                           - MILD Duodenitis. Moderate Sedation:      Moderate (conscious) sedation was administered by the endoscopy nurse       and supervised by the endoscopist. The following parameters were       monitored: oxygen saturation, heart rate, blood pressure, and response       to care. Total physician intraservice time was 24  minutes. Recommendation:           - Patient has a contact number available for                            emergencies. The signs and symptoms of potential                            delayed complications were discussed with the                            patient. Return to normal activities tomorrow.                            Written discharge instructions were provided to the                            patient.                           - Low fat diet.                           - Continue present medications.                           - Await pathology results.                           - Return to GI office in 4 months. Procedure Code(s):        --- Professional ---                           [phone], Esophagogastroduodenoscopy, flexible,                            transoral; with insertion of guide wire followed by                            passage of dilator(s) through esophagus over guide                            wire                           [id number], 59, Esophagogastroduodenoscopy,  flexible,                            transoral; with biopsy, single or multiple                           [id number], Moderate sedation; each additional 15                            minutes intraservice time                           G0500, Moderate sedation services provided by the                            same physician or other qualified health care                            professional performing a gastrointestinal                            endoscopic service that sedation supports,                            requiring the presence of an independent trained                            observer to assist in the monitoring of the                            patient's level of consciousness and physiological                            status; initial 15 minutes of intra-service time;                            patient age 80 years or older (additional time may                            be reported with  [phone], as appropriate) Diagnosis Code(s):        --- Professional ---                           K22.2, Esophageal obstruction                           K29.70, Gastritis, unspecified, without bleeding                           K29.80, Duodenitis without bleeding                           R13.10, Dysphagia, unspecified CPT copyright 2019 [employer] Association. All rights reserved. The codes documented in this report are preliminary and upon coder review may  be revised to meet current compliance requirements. Barney Drain, MD Barney Drain MD, MD  10/19/2019 11:18:26 AM This report has been signed electronically. Number of Addenda: 0

## 2019-10-20 ENCOUNTER — Other Ambulatory Visit: Payer: Self-pay

## 2019-10-20 LAB — SURGICAL PATHOLOGY

## 2019-10-28 ENCOUNTER — Telehealth: Payer: Self-pay | Admitting: Gastroenterology

## 2019-10-28 NOTE — Telephone Encounter (Signed)
Called pt and discussed results and recommendations per [facility].  Pt would like to know if he needs to discontinue Pantoprazole that he takes everyday.  He was advised to add Omeprazole with breakfast every AM.

## 2019-10-28 NOTE — Telephone Encounter (Signed)
Please call pt. His stomach Bx shows mild gastritis DUE TO ASPIRIN. TO PREVENT ULCERS AND TREAT GASTRITIS, ADD OMEPRAZOLE WITH BREAKFAST EVERY AM.    AVOID REFLUX TRIGGERS.  DRINK WATER TO KEEP YOUR URINE LIGHT YELLOW. FOLLOW A LOW FAT DIET. AVOID FRIED AND FAST FOODS. MEATS SHOULD BE BAKED, BROILED, OR BOILED.Marland Kitchen PLEASE CALL IN ONE MONTH IF YOUR STILL HAVING PROBLEMS SWALLOWING. FOLLOW UP IN 4 MOS.

## 2019-10-28 NOTE — Telephone Encounter (Signed)
PLEASE CALL PT. I REVIEWED HIS MED LIST BUT DIDN'T SEE PANTOPRAZOLE ON HIS LIST. HE CAN TAKE EITHER OMEPRAZOLE OR PANTOPRAZOLE. THEY WORK THE SAME. HE DOES NOT NEED BOTH.

## 2019-10-29 NOTE — Telephone Encounter (Signed)
Called pt and made him aware that he can take either Omeprazole or Pantoprazole.   Pt was informed that they work the same per [facility]. He is aware that he does not need to take both.  Pt voiced understanding.

## 2019-11-10 DIAGNOSIS — M79675 Pain in left toe(s): Secondary | ICD-10-CM | POA: Diagnosis not present

## 2019-11-10 DIAGNOSIS — L03032 Cellulitis of left toe: Secondary | ICD-10-CM | POA: Diagnosis not present

## 2019-12-07 DIAGNOSIS — Z789 Other specified health status: Secondary | ICD-10-CM | POA: Diagnosis not present

## 2019-12-07 DIAGNOSIS — I509 Heart failure, unspecified: Secondary | ICD-10-CM | POA: Diagnosis not present

## 2019-12-07 DIAGNOSIS — I498 Other specified cardiac arrhythmias: Secondary | ICD-10-CM | POA: Diagnosis not present

## 2019-12-07 DIAGNOSIS — I10 Essential (primary) hypertension: Secondary | ICD-10-CM | POA: Diagnosis not present

## 2019-12-07 DIAGNOSIS — Z299 Encounter for prophylactic measures, unspecified: Secondary | ICD-10-CM | POA: Diagnosis not present

## 2019-12-07 DIAGNOSIS — R001 Bradycardia, unspecified: Secondary | ICD-10-CM | POA: Diagnosis not present

## 2019-12-11 ENCOUNTER — Encounter: Payer: Self-pay | Admitting: *Deleted

## 2019-12-11 ENCOUNTER — Telehealth: Payer: Self-pay | Admitting: Cardiology

## 2019-12-11 NOTE — Telephone Encounter (Signed)
Thank you for the update.  He has a history of ventricular bigeminy, so I would bet that these suddenly recorded low heart rates in the 30s are actually periods of ventricular bigeminy.  An ECG would certainly be helpful.  Keep scheduled office visit.

## 2019-12-11 NOTE — Telephone Encounter (Signed)
Reports seeing PCP on Monday, 12/07/2019 and has a return visit on 12/14/19 @noon . Reports heart rate was low (he is unsure of the reading) and carvedilol was decreased to 3.125 mg twice daily & hydralazine 50 mg twice daily-started  Both on 12/08/19.  Recent heart rates have been:  12/08/2019 12:10 pm BP 140/60 & HR 32 BP 127/63 & HR 57 8:10 pm 12/09/2019 7:10 am 133/64 HR 66 1:20 pm 143/66 HR 54 12/11/2019 7:00 am 157/63 HR 37  Reports feeling a little dizzy. Denies SOB or chest pain. Office visit scheduled with Katina Dung NP on Monday 12/14/19 @4 :00 pm. Advised if he develops worsening symptoms to go to the ED for an evaluation. Verbalized understanding of plan.

## 2019-12-11 NOTE — Telephone Encounter (Signed)
Received telephone call from [facility] stating that patient is having issues with low Heart Rate. Was seen by Dr. Manuella Ghazi (PCP) 12/07/2019 .Dr. Manuella Ghazi changed patient's medications. States that he continues to have low heart rate with some dizziness. [employer] stated that they felt Dr. Domenic Polite needs to address this.

## 2019-12-14 ENCOUNTER — Encounter: Payer: Self-pay | Admitting: Family Medicine

## 2019-12-14 ENCOUNTER — Other Ambulatory Visit: Payer: Self-pay

## 2019-12-14 ENCOUNTER — Ambulatory Visit: Payer: Medicare Other | Admitting: Family Medicine

## 2019-12-14 VITALS — BP 134/60 | HR 76 | Ht 73.0 in | Wt 221.2 lb

## 2019-12-14 DIAGNOSIS — I700 Atherosclerosis of aorta: Secondary | ICD-10-CM | POA: Diagnosis not present

## 2019-12-14 DIAGNOSIS — R001 Bradycardia, unspecified: Secondary | ICD-10-CM | POA: Diagnosis not present

## 2019-12-14 DIAGNOSIS — I5181 Takotsubo syndrome: Secondary | ICD-10-CM

## 2019-12-14 DIAGNOSIS — E782 Mixed hyperlipidemia: Secondary | ICD-10-CM

## 2019-12-14 DIAGNOSIS — I6523 Occlusion and stenosis of bilateral carotid arteries: Secondary | ICD-10-CM | POA: Diagnosis not present

## 2019-12-14 DIAGNOSIS — I4891 Unspecified atrial fibrillation: Secondary | ICD-10-CM | POA: Diagnosis not present

## 2019-12-14 DIAGNOSIS — I10 Essential (primary) hypertension: Secondary | ICD-10-CM | POA: Diagnosis not present

## 2019-12-14 DIAGNOSIS — Z299 Encounter for prophylactic measures, unspecified: Secondary | ICD-10-CM | POA: Diagnosis not present

## 2019-12-14 MED ORDER — CARVEDILOL 3.125 MG PO TABS: 3.1250 mg | ORAL_TABLET | Freq: Two times a day (BID) | ORAL | 3 refills | Status: DC

## 2019-12-14 NOTE — Progress Notes (Signed)
Cardiology Office Note  Date: 12/14/2019   ID: Darius Baxter, DOB 12/09/1933, MRN [id number]  PCP:  Ralph Leyden, FNP  Cardiologist:  Rozann Lesches, MD Electrophysiologist:  None   Chief Complaint: Follow-up, stress-induced cardiomyopathy (Takotsubo's), mild CAD back cardiac catheterization 2013 (MI 2013), carotid artery disease (bilateral endarterectomies), HLD, HTN,  History of Present Illness: Darius Baxter is a 86 y.o. male with a history of stress-induced cardiomyopathy (Takotsubo's), mild CAD by cardiac catheterization 2013 (MI 2013), carotid artery disease (bilateral endarterectomies), HLD, HTN.  Last saw Dr. Domenic Polite April 24, 2019.  At that visit he had not complained of any exertional chest pain or worsening shortness of breath.  He was limited by back and knee pain but performanced all ADLs including yard work.  History of carotid artery disease with bilateral carotid endarterectomies.  His blood pressure was elevated at last visit.  He had a blood pressure cuff at home but does not check BP regularly.  Dr. Domenic Polite encouraged him to check his blood pressure daily for 2 weeks prior to his pending visit with PCP and stated he may need further up titration of his antihypertensive medication.  He presents today with no particular complaints.  He denies any anginal or exertional symptoms, palpitations or arrhythmias, orthostatic symptoms but does state he feels a little unsteady on his feet at times and some occasional transient dizziness.  He does wear braces on both knees and has some instability from a gait and stance perspective, but no presyncopal or syncopal episodes/orthostatic symptoms.  EKG today shows sinus rhythm with frequent PVCs rate of 78, nonspecific ST and T wave abnormality.  Patient denies any CVA or TIA-like symptoms, palpitations or arrhythmias.  He denies any PND or orthopnea.  He does have some bilateral lower extremity edema which is chronic.  He wears  low-grade compression stockings.  He takes Lasix 20 mg a day.  He brings with him a log of blood pressures which appears to average in the [id number] systolic.   Past Medical History:  Diagnosis Date  . Arthritis   . Carotid artery disease ([place])   . Chronic lower back pain   . Coronary artery disease    Mild at cardiac catheterization 2013  . Essential hypertension   . History of blood transfusion 2008; 2010  . Mitral valve prolapse    Mild mitral regurgitation  . Mixed hyperlipidemia   . Myocardial infarction ([place])    2013  . Nocturia   . Renal cancer [facility])    Status post right nephrectomy - 09/2006  . Seizures ([place])    Childhood-86 yrs old "passed out"  . Takotsubo cardiomyopathy    February 2013  . Ventricular ectopy    Bigeminy    Past Surgical History:  Procedure Laterality Date  . APPENDECTOMY    . BIOPSY  10/19/2019   Procedure: BIOPSY;  Surgeon: Danie Binder, MD;  [facility];  Service: Endoscopy;;  . CARDIAC CATHETERIZATION    . CAROTID ENDARTERECTOMY Left 02/23/2016  . CAROTID ENDARTERECTOMY Right 06/11/2018   PATCH ANGIOPLASTY WITH XENOSURE PATCH  . CATARACT EXTRACTION W/PHACO  03/22/2011   Procedure: CATARACT EXTRACTION PHACO AND INTRAOCULAR LENS PLACEMENT (IOC);  Surgeon: Tonny Branch;  Location: [facility] ORS;  Service: Ophthalmology;  Laterality: Right;  CDE  19.88  . CATARACT EXTRACTION W/PHACO  04/16/2011   Procedure: CATARACT EXTRACTION PHACO AND INTRAOCULAR LENS PLACEMENT (IOC);  Surgeon: Tonny Branch;  Location: [facility] ORS;  Service: Ophthalmology;  Laterality:  Left;  CDE 21.13  . CHOLECYSTECTOMY  01/29/2012   Procedure: LAPAROSCOPIC CHOLECYSTECTOMY WITH INTRAOPERATIVE CHOLANGIOGRAM;  Surgeon: Joyice Faster. Cornett, MD;  Location: [place];  Service: General;  Laterality: N/A;  . ENDARTERECTOMY Left 02/23/2016   Procedure: LEFT CAROTID ENDARTERECTOMY WITH XENOSURE BOVINE PERICARDIUM PATCH ANGIOPLASTY;  Surgeon: Serafina Mitchell, MD;  Location: [place];  Service: Vascular;   Laterality: Left;  . ENDARTERECTOMY Right 06/11/2018   Procedure: ENDARTERECTOMY CAROTID RIGHT;  Surgeon: Serafina Mitchell, MD;  Location: [facility] OR;  Service: Vascular;  Laterality: Right;  . ESOPHAGOGASTRODUODENOSCOPY N/A 10/19/2019   Procedure: ESOPHAGOGASTRODUODENOSCOPY (EGD);  Surgeon: Danie Binder, MD;  [facility];  Service: Endoscopy;  Laterality: N/A;  9:30am  . JOINT REPLACEMENT    . KNEE ARTHROSCOPY Right   . LITHOTRIPSY    . NEPHRECTOMY Right 2008  . PATCH ANGIOPLASTY Right 06/11/2018   Procedure: PATCH ANGIOPLASTY WITH Weyman Pedro;  Surgeon: Serafina Mitchell, MD;  Location: [facility] OR;  Service: Vascular;  Laterality: Right;  . SAVORY DILATION N/A 10/19/2019   Procedure: SAVORY DILATION;  Surgeon: Danie Binder, MD;  [facility];  Service: Endoscopy;  Laterality: N/A;  . TOTAL KNEE ARTHROPLASTY  2010  . TOTAL KNEE ARTHROPLASTY Left 2014    Current Outpatient Medications  Medication Sig Dispense Refill  . acetaminophen (TYLENOL) 500 MG tablet Take 1,000 mg by mouth daily as needed for moderate pain.    Marland Kitchen alfuzosin (UROXATRAL) 10 MG 24 hr tablet Take 10 mg by mouth daily with breakfast.     . amLODipine (NORVASC) 10 MG tablet Take 10 mg by mouth daily.    . Artificial Tear Solution (SOOTHE XP OP) Place 1 drop into both eyes 3 (three) times daily.    Marland Kitchen aspirin EC 81 MG tablet Take 1 tablet (81 mg total) by mouth daily. 30 tablet 3  . atorvastatin (LIPITOR) 40 MG tablet Take 40 mg by mouth at bedtime.     . baclofen (LIORESAL) 10 MG tablet Take 10 mg by mouth daily.     . Cholecalciferol (VITAMIN D) 2000 units tablet Take 2,000 Units by mouth daily.    . diphenhydramine-acetaminophen (TYLENOL PM) 25-500 MG TABS tablet Take 2 tablets by mouth at bedtime.    . furosemide (LASIX) 20 MG tablet Take 20 mg by mouth daily.     . hydrALAZINE (APRESOLINE) 50 MG tablet Take 50 mg by mouth 2 (two) times daily.    Marland Kitchen HYDROcodone-acetaminophen (NORCO) 7.5-325 MG tablet  Take 1 tablet by mouth daily as needed for moderate pain.     Marland Kitchen HYDROcodone-acetaminophen (NORCO) 7.5-325 MG tablet Take 1 tablet by mouth every 6 (six) hours as needed for moderate pain.    Marland Kitchen olmesartan (BENICAR) 40 MG tablet Take 40 mg by mouth daily.    Marland Kitchen OVER THE COUNTER MEDICATION Apply 1 application topically daily as needed (pain). Hemp cream    . pantoprazole (PROTONIX) 40 MG tablet Take 40 mg by mouth daily.    . carvedilol (COREG) 3.125 MG tablet Take 1 tablet (3.125 mg total) by mouth 2 (two) times daily. 180 tablet 3   No current facility-administered medications for this visit.   Allergies:  Nsaids and Vicodin [hydrocodone-acetaminophen]   Social History: The patient  reports that he has never smoked. He has never used smokeless tobacco. He reports that he does not drink alcohol or use drugs.   Family History: The patient's family history includes CVA in his father; Cancer in  an other family member; Coronary artery disease in an other family member; Hypertension in his mother and another family member; Peripheral Artery Disease in his mother; Prostate cancer in his father; Stroke in an other family member.   ROS:  Please see the history of present illness. Otherwise, complete review of systems is positive for none.  All other systems are reviewed and negative.   Physical Exam: VS:  BP 134/60   Pulse 76   Ht 6\' 1"  (1.854 m)   Wt 221 lb 3.2 oz (100.3 kg)   SpO2 97%   BMI 29.18 kg/m , BMI Body mass index is 29.18 kg/m.  Wt Readings from Last 3 Encounters:  12/14/19 221 lb 3.2 oz (100.3 kg)  10/15/19 209 lb 6.4 oz (95 kg)  07/20/19 214 lb 11.2 oz (97.4 kg)    General: Patient appears comfortable at rest. Neck: Supple, no elevated JVP or carotid bruits, no thyromegaly. Lungs: Clear to auscultation, nonlabored breathing at rest. Cardiac: Regular rate and rhythm, no S3 or significant systolic murmur, no pericardial rub. Extremities: Mild pitting edema, distal pulses  2+. Skin: Warm and dry. Musculoskeletal: No kyphosis. Neuropsychiatric: Alert and oriented x3, affect grossly appropriate.  ECG:  An ECG dated 12/14/2019 was personally reviewed today and demonstrated:  Sinus rhythm with frequent premature ventricular complexes, rate of 78, nonspecific ST and T wave abnormality  Recent Labwork: 05/23/2019: BUN 22; Creatinine, Ser 0.91; Hemoglobin 15.2; Platelets 155; Potassium 4.5; Sodium 139     Component Value Date/Time   CHOL 143 10/06/2011 0530   TRIG 310 (H) 10/06/2011 0530   HDL 40 10/06/2011 0530   CHOLHDL 3.6 10/06/2011 0530   VLDL 62 (H) 10/06/2011 0530   LDLCALC 41 10/06/2011 0530    Other Studies Reviewed Today:  Carotid artery duplex 07/20/2019  Right Carotid: Velocities in the right ICA are consistent with a 1-39% stenosis. Nonhemodynamically significant plaque <50% noted in the CCA. The ECA appears <50% stenosed. Patent right carotid endarterecotomy with non-hemodynamically significant plaque noted. Left Carotid: Velocities in the left ICA are consistent with a 1-39% stenosis. Nonhemodynamically significant plaque <50% noted in the CCA. The ECA appears <50% stenosed. Patent left carotid endarterectomy with non-hemodynamically significant plaque noted. Vertebrals: Bilateral vertebral arteries demonstrate antegrade flow. Subclavians: Normal flow hemodynamics were seen in bilateral subclavian arteries.    Echocardiogram 03/20/2018: Study Conclusions  - Left ventricle: The cavity size was normal. Wall thickness was increased in a pattern of mild LVH. Systolic function was normal. The estimated ejection fraction was in the range of 55% to 60%. Wall motion was normal; there were no regional wall motion abnormalities. Doppler parameters are consistent with abnormal left ventricular relaxation (grade 1 diastolic dysfunction). - Aortic valve: Mildly calcified annulus. Trileaflet. There was trivial regurgitation. - Mitral  valve: Mildly calcified annulus. There was mild regurgitation. - Right atrium: Central venous pressure (est): 3 mm Hg. - Atrial septum: No defect or patent foramen ovale was identified. - Tricuspid valve: There was trivial regurgitation. - Pulmonary arteries: PA peak pressure: 18 mm Hg (S). - Pericardium, extracardiac: There was no pericardial effusion.  Carotid Dopplers 05/22/2018: Final Interpretation: Right Carotid: Velocities in the right ICA are consistent with a 80-99% stenosis.  Left Carotid: Velocities in the left ICA are consistent with a 1-39% stenosis. The extracranial vessels were near-normal with only minimal wall thickening or plaque.  Vertebrals: Bilateral vertebral arteries demonstrate antegrade flow. Subclavians: Normal flow hemodynamics were seen in bilateral subclavian arteries.  Assessment and Plan:  1.  Takotsubo cardiomyopathy   2. Carotid stenosis, bilateral   3. Essential hypertension   4. Mixed hyperlipidemia    1. Takotsubo cardiomyopathy Resolved on echo August 2019; mild LVH, EF 55 to 60%.  Grade 1 DD, trivial TR, mild MR, trivial AR .  Patient denies any progressive anginal or exertional symptoms.  We will continue to monitor.   2. Carotid stenosis, bilateral Carotid endarterectomy left on 02/23/2016, carotid endarterectomy on the right 06/11/2018.  Patient denies any issues related to carotid artery stenosis of recent carotid artery duplex showed mild bilateral ICA stenosis of 1 to 39%.  3. Essential hypertension Patient brings with him a log of blood pressures and it appears that his systolic is averaging in the 130s.  He states he was told to stop his carvedilol completely by the nurse at PCP office.  Advised patient to continue carvedilol at 3.125 mg p.o. twice daily.  Continue amlodipine 10 mg daily, continue hydralazine 50 mg p.o. twice daily, continue olmesartan 40 mg daily.  He does have some mild  bilateral lower extremity edema which could be attributed to max dose of amlodipine.  He is wearing compression stockings.   4. Mixed hyperlipidemia No recent lipid panel.  We will attempt to obtain recent lab work from PCP.  Continue atorvastatin 40 mg p.o. daily.   Medication Adjustments/Labs and Tests Ordered: Current medicines are reviewed at length with the patient today.  Concerns regarding medicines are outlined above.   Disposition: Follow-up with Dr. Domenic Polite or APP 12 months  Signed, Levell July, NP 12/14/2019 5:04 PM    [facility] [place], [place], [place] [address] Phone: [phone]; Fax: [phone]

## 2019-12-14 NOTE — Patient Instructions (Addendum)
Medication Instructions:    Your physician recommends that you continue on your current medications as directed. Please refer to the Current Medication list given to you today.  Please stay on your carvedilol 3.125 mg by mouth twice daily-a new prescription was sent to your pharmacy for this dosage.  Labwork:  NONE  Testing/Procedures:  NONE  Follow-Up:  Your physician recommends that you schedule a follow-up appointment in: 1 year (office). You will receive a reminder letter in the mail in about 10 months reminding you to call and schedule your appointment. If you don't receive this letter, please contact our office.  Any Other Special Instructions Will Be Listed Below (If Applicable).  If you need a refill on your cardiac medications before your next appointment, please call your pharmacy.

## 2019-12-15 ENCOUNTER — Encounter: Payer: Self-pay | Admitting: *Deleted

## 2020-01-13 DIAGNOSIS — R001 Bradycardia, unspecified: Secondary | ICD-10-CM | POA: Diagnosis not present

## 2020-01-13 DIAGNOSIS — I10 Essential (primary) hypertension: Secondary | ICD-10-CM | POA: Diagnosis not present

## 2020-01-13 DIAGNOSIS — Z299 Encounter for prophylactic measures, unspecified: Secondary | ICD-10-CM | POA: Diagnosis not present

## 2020-01-13 DIAGNOSIS — I509 Heart failure, unspecified: Secondary | ICD-10-CM | POA: Diagnosis not present

## 2020-01-13 DIAGNOSIS — I700 Atherosclerosis of aorta: Secondary | ICD-10-CM | POA: Diagnosis not present

## 2020-01-13 DIAGNOSIS — I429 Cardiomyopathy, unspecified: Secondary | ICD-10-CM | POA: Diagnosis not present

## 2020-02-01 DIAGNOSIS — M47817 Spondylosis without myelopathy or radiculopathy, lumbosacral region: Secondary | ICD-10-CM | POA: Diagnosis not present

## 2020-02-01 DIAGNOSIS — M545 Low back pain: Secondary | ICD-10-CM | POA: Diagnosis not present

## 2020-02-01 DIAGNOSIS — Z299 Encounter for prophylactic measures, unspecified: Secondary | ICD-10-CM | POA: Diagnosis not present

## 2020-02-01 DIAGNOSIS — M48061 Spinal stenosis, lumbar region without neurogenic claudication: Secondary | ICD-10-CM | POA: Diagnosis not present

## 2020-02-01 DIAGNOSIS — I10 Essential (primary) hypertension: Secondary | ICD-10-CM | POA: Diagnosis not present

## 2020-02-01 DIAGNOSIS — Z789 Other specified health status: Secondary | ICD-10-CM | POA: Diagnosis not present

## 2020-02-01 DIAGNOSIS — M47816 Spondylosis without myelopathy or radiculopathy, lumbar region: Secondary | ICD-10-CM | POA: Diagnosis not present

## 2020-02-12 ENCOUNTER — Ambulatory Visit: Payer: Medicare Other | Admitting: Gastroenterology

## 2020-02-12 ENCOUNTER — Other Ambulatory Visit: Payer: Self-pay

## 2020-02-12 ENCOUNTER — Encounter: Payer: Self-pay | Admitting: Gastroenterology

## 2020-02-12 DIAGNOSIS — K219 Gastro-esophageal reflux disease without esophagitis: Secondary | ICD-10-CM

## 2020-02-12 DIAGNOSIS — K5900 Constipation, unspecified: Secondary | ICD-10-CM

## 2020-02-12 NOTE — Progress Notes (Signed)
Referring Provider: Monico Blitz, MD Primary Care Physician:  Ralph Leyden, FNP Primary GI: previously Dr. Oneida Alar   Chief Complaint  Patient presents with  . Follow-up    constipation, stool is hard balls formed    HPI:   Darius Baxter is an 86 y.o. male presenting today with a history of GERD and dysphagia/odynophagia, s/p recent EGD March 2021 with one benign-appearing, intrinsic moderate stenosis s/p dilation. Moderate gastritis due to aspirin, mild duodenitis.   No more odynophagia. No dysphagia. Taking pantoprazole daily. Feels this has helped tremendously. A year of constipation, maybe longer. Has to go "fetch" his BMs in the morning from his rectum. Hard balls. Then will break loose. Once in awhile will take ex-lax. Takes hydrocodone on Sunday morning church and Wednesday Bible study. Doesn't want to try Benefiber. Has Miralax at home and wants to try this first. States this has helped in past. Would like to return only as needed.   Past Medical History:  Diagnosis Date  . Arthritis   . Carotid artery disease ([place])   . Chronic lower back pain   . Coronary artery disease    Mild at cardiac catheterization 2013  . Essential hypertension   . History of blood transfusion 2008; 2010  . Mitral valve prolapse    Mild mitral regurgitation  . Mixed hyperlipidemia   . Myocardial infarction ([place])    2013  . Nocturia   . Renal cancer [facility])    Status post right nephrectomy - 09/2006  . Seizures ([place])    Childhood-86 yrs old "passed out"  . Takotsubo cardiomyopathy    February 2013  . Ventricular ectopy    Bigeminy    Past Surgical History:  Procedure Laterality Date  . APPENDECTOMY    . BIOPSY  10/19/2019   Procedure: BIOPSY;  Surgeon: Danie Binder, MD;  [facility];  Service: Endoscopy;;  . CARDIAC CATHETERIZATION    . CAROTID ENDARTERECTOMY Left 02/23/2016  . CAROTID ENDARTERECTOMY Right 06/11/2018   PATCH ANGIOPLASTY WITH XENOSURE PATCH  .  CATARACT EXTRACTION W/PHACO  03/22/2011   Procedure: CATARACT EXTRACTION PHACO AND INTRAOCULAR LENS PLACEMENT (IOC);  Surgeon: Tonny Branch;  Location: [facility];  Service: Ophthalmology;  Laterality: Right;  CDE  19.88  . CATARACT EXTRACTION W/PHACO  04/16/2011   Procedure: CATARACT EXTRACTION PHACO AND INTRAOCULAR LENS PLACEMENT (IOC);  Surgeon: Tonny Branch;  [facility]: [facility];  Service: Ophthalmology;  Laterality: Left;  CDE 21.13  . CHOLECYSTECTOMY  01/29/2012   Procedure: LAPAROSCOPIC CHOLECYSTECTOMY WITH INTRAOPERATIVE CHOLANGIOGRAM;  Surgeon: Joyice Faster. Cornett, MD;  Location: [facility];  Service: General;  Laterality: N/A;  . ENDARTERECTOMY Left 02/23/2016   Procedure: LEFT CAROTID ENDARTERECTOMY WITH XENOSURE BOVINE PERICARDIUM PATCH ANGIOPLASTY;  Surgeon: Serafina Mitchell, MD;  Location: [facility];  Service: Vascular;  Laterality: Left;  . ENDARTERECTOMY Right 06/11/2018   Procedure: ENDARTERECTOMY CAROTID RIGHT;  Surgeon: Serafina Mitchell, MD;  Location: [facility] OR;  Service: Vascular;  Laterality: Right;  . ESOPHAGOGASTRODUODENOSCOPY N/A 10/19/2019   one benign-appearing, intrinsic moderate stenosis s/p dilation. Moderate gastritis due to aspirin, mild duodenitis.   Marland Kitchen JOINT REPLACEMENT    . KNEE ARTHROSCOPY Right   . LITHOTRIPSY    . NEPHRECTOMY Right 2008  . PATCH ANGIOPLASTY Right 06/11/2018   Procedure: PATCH ANGIOPLASTY WITH Weyman Pedro;  Surgeon: Serafina Mitchell, MD;  Location: [facility] OR;  Service: Vascular;  Laterality: Right;  . SAVORY DILATION N/A 10/19/2019   Procedure:  SAVORY DILATION;  Surgeon: Danie Binder, MD;  [facility];  Service: Endoscopy;  Laterality: N/A;  . TOTAL KNEE ARTHROPLASTY  2010  . TOTAL KNEE ARTHROPLASTY Left 2014    Current Outpatient Medications  Medication Sig Dispense Refill  . acetaminophen (TYLENOL) 500 MG tablet Take 1,000 mg by mouth daily as needed for moderate pain.    Marland Kitchen amLODipine (NORVASC) 10 MG tablet Take 10 mg by mouth daily.    . Artificial Tear  Solution (SOOTHE XP OP) Place 1 drop into both eyes 3 (three) times daily.    Marland Kitchen aspirin EC 81 MG tablet Take 1 tablet (81 mg total) by mouth daily. 30 tablet 3  . atorvastatin (LIPITOR) 40 MG tablet Take 40 mg by mouth at bedtime.     . baclofen (LIORESAL) 10 MG tablet Take 10 mg by mouth daily.     . carvedilol (COREG) 3.125 MG tablet Take 1 tablet (3.125 mg total) by mouth 2 (two) times daily. 180 tablet 3  . Cholecalciferol (VITAMIN D) 2000 units tablet Take 2,000 Units by mouth daily.    . diphenhydramine-acetaminophen (TYLENOL PM) 25-500 MG TABS tablet Take 2 tablets by mouth at bedtime.    . furosemide (LASIX) 20 MG tablet Take 20 mg by mouth daily.     . hydrALAZINE (APRESOLINE) 50 MG tablet Take 50 mg by mouth 2 (two) times daily.    Marland Kitchen HYDROcodone-acetaminophen (NORCO) 7.5-325 MG tablet Take 1 tablet by mouth as needed for moderate pain. Takes one tablet 2 times a week.    . olmesartan (BENICAR) 40 MG tablet Take 40 mg by mouth daily.    . pantoprazole (PROTONIX) 40 MG tablet Take 40 mg by mouth daily.     No current facility-administered medications for this visit.    Allergies as of 02/12/2020 - Review Complete 12/14/2019  Allergen Reaction Noted  . Nsaids  10/16/2019  . Vicodin [hydrocodone-acetaminophen] Nausea And Vomiting 06/05/2018    Family History  Problem Relation Age of Onset  . Cancer Other   . Stroke Other   . Coronary artery disease Other   . Hypertension Other   . Hypertension Mother   . Peripheral Artery Disease Mother        had leg bypass, and died after surgery; believed to be from throwing a clot  . CVA Father   . Prostate cancer Father   . Anesthesia problems Neg Hx   . Hypotension Neg Hx   . Malignant hyperthermia Neg Hx   . Pseudochol deficiency Neg Hx     Social History   Socioeconomic History  . Marital status: Married    Spouse name: Not on file  . Number of children: Not on file  . Years of education: Not on file  . Highest education  level: Not on file  Occupational History  . Not on file  Tobacco Use  . Smoking status: Never Smoker  . Smokeless tobacco: Never Used  Vaping Use  . Vaping Use: Never used  Substance and Sexual Activity  . Alcohol use: No    Alcohol/week: 0.0 standard drinks  . Drug use: No  . Sexual activity: Yes    Birth control/protection: None  Other Topics Concern  . Not on file  Social History Narrative   Does not regularly exercise.    Social Determinants of Health   Financial Resource Strain:   . Difficulty of Paying Living Expenses:   Food Insecurity:   . Worried About Estate manager/land agent  of Food in the Last Year:   . [place] in the Last Year:   Transportation Needs:   . Lack of Transportation (Medical):   Marland Kitchen Lack of Transportation (Non-Medical):   Physical Activity:   . Days of Exercise per Week:   . Minutes of Exercise per Session:   Stress:   . Feeling of Stress :   Social Connections:   . Frequency of Communication with Friends and Family:   . Frequency of Social Gatherings with Friends and Family:   . Attends Religious Services:   . Active Member of Clubs or Organizations:   . Attends Archivist Meetings:   Marland Kitchen Marital Status:     Review of Systems: Gen: Denies fever, chills, anorexia. Denies fatigue, weakness, weight loss.  CV: Denies chest pain, palpitations, syncope, peripheral edema, and claudication. Resp: Denies dyspnea at rest, cough, wheezing, coughing up blood, and pleurisy. GI: see HPI Derm: Denies rash, itching, dry skin Psych: Denies depression, anxiety, memory loss, confusion. No homicidal or suicidal ideation.  Heme: Denies bruising, bleeding, and enlarged lymph nodes.  Physical Exam: BP 140/76   Pulse 96   Temp (!) 96.9 F (36.1 C) (Temporal)   Ht 6\' 1"  (1.854 m)   Wt 222 lb 3.2 oz (100.8 kg)   BMI 29.32 kg/m  General:   Alert and oriented. No distress noted. Pleasant and cooperative.  Head:  Normocephalic and atraumatic. Eyes:   Conjuctiva clear without scleral icterus. Mouth:  Mask in place Abdomen:  +BS, soft, non-tender and non-distended. No rebound or guarding. Ventral hernia vs rectus diastasis Msk:  Symmetrical without gross deformities. Normal posture. Extremities:  Without edema. Neurologic:  Alert and  oriented x4 Psych:  Alert and cooperative. Normal mood and affect.  ASSESSMENT: Darius Baxter is an 86 y.o. male presenting today with history of dysphagia/odnophagia, now resolved s/p EGD with dilation, GERD controlled with Protonix daily, and chronic constipation.  He desires to use OTC measures for now and with what he has at home: we discussed opioid-induced constipation, and he is using this sparingly. As he has Miralax at home, will have him take this daily to every other day as needed. He is to call if no improvement. I did recommend supplemental fiber as well, but he wants to only take Miralax currently.   Darius Baxter also requests to return only as needed; as he is doing well, we will see him back on a prn basis. Continue PPI daily as he is doing. He is aware of signs/symptoms that would prompt needing a visit or medical attention.    PLAN:   Continue PPI daily  Miralax daily as needed  Return as needed  Call if any concerns   Annitta Needs, PhD, ANP-BC [facility]

## 2020-02-12 NOTE — Patient Instructions (Signed)
You can take 1 capful of Miralax with a full glass of water each morning (or evening), to help keep stools softer and more regular. Please let me know if this is not helpful!  Please call if any other issues swallowing.  We will see you back as needed!  Thank you for the peppermints!  It was a pleasure to see you today. I want to create trusting relationships with patients to provide genuine, compassionate, and quality care. I value your feedback. If you receive a survey regarding your visit,  I greatly appreciate you taking time to fill this out.   Annitta Needs, PhD, ANP-BC [facility]

## 2020-02-17 DIAGNOSIS — I10 Essential (primary) hypertension: Secondary | ICD-10-CM | POA: Diagnosis not present

## 2020-02-17 DIAGNOSIS — E785 Hyperlipidemia, unspecified: Secondary | ICD-10-CM | POA: Diagnosis not present

## 2020-02-18 IMAGING — CT CT ANGIO NECK
1 of 7 series · 7 of 33 positions shown · IV contrast (omnipaque)
Comparison: Carotid artery duplex 02/06/2016

CLINICAL DATA: Dissection of vertebral artery. Additional history
provided: Right-sided neck pain that started 6 weeks ago,
hypertension for the last week, dizziness.

EXAM:
CT ANGIOGRAPHY NECK
TECHNIQUE: Multidetector CT imaging of the neck was performed using the
standard protocol during bolus administration of intravenous
contrast. Multiplanar CT image reconstructions and MIPs were
obtained to evaluate the vascular anatomy. Carotid stenosis
measurements (when applicable) are obtained utilizing NASCET
criteria, using the distal internal carotid diameter as the
denominator.
CONTRAST:  100mL OMNIPAQUE IOHEXOL 350 MG/ML SOLN

[Series 7: ax thin · axial · 0.50mm/px · z∈[+2,+290]mm · 7 of 386 slices shown]
[im 49/386  soft-tissue]
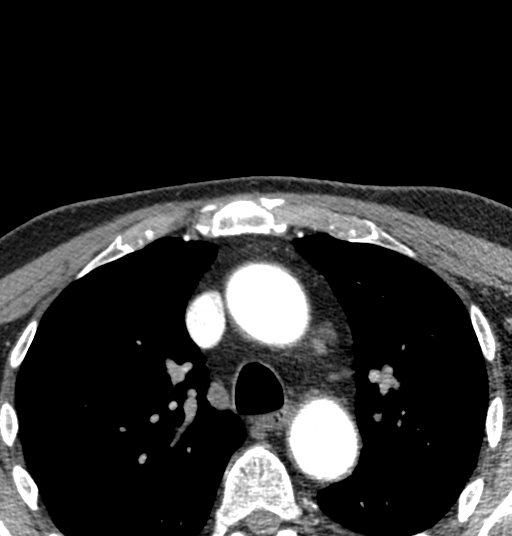
[im 97/386  bone]
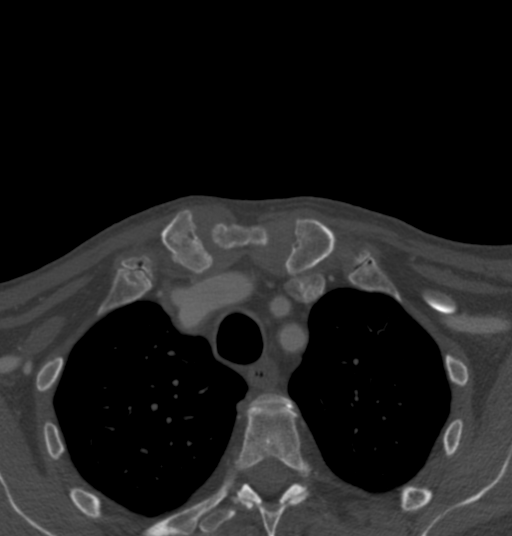
[im 145/386  soft-tissue]
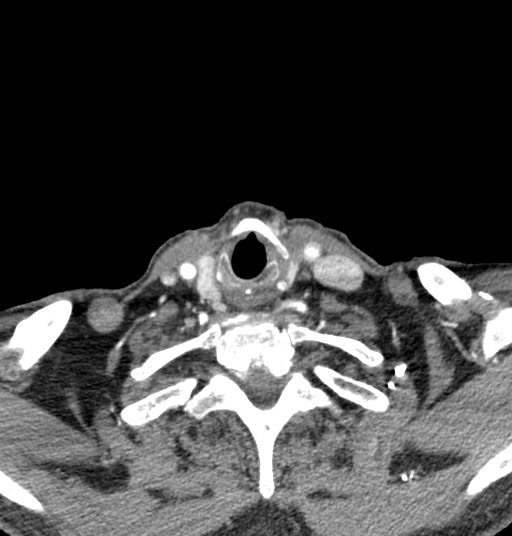
[im 193/386  bone]
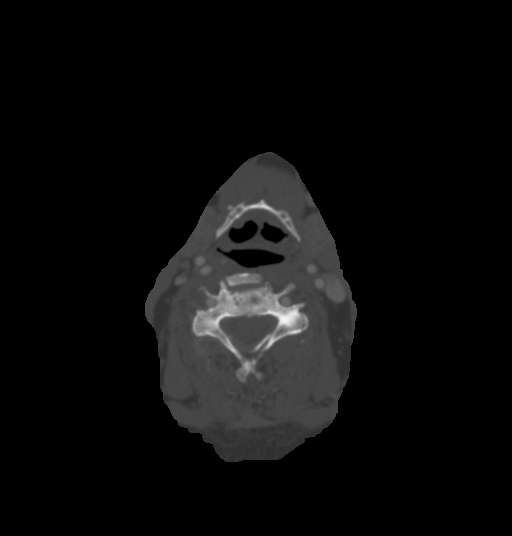
[im 241/386  soft-tissue]
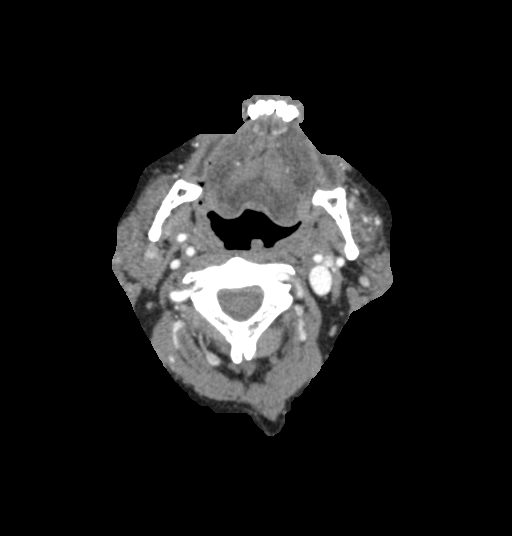
[im 289/386  bone]
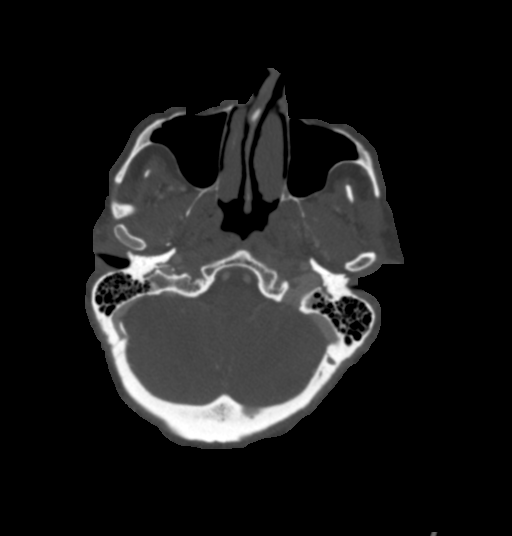
[im 337/386  soft-tissue]
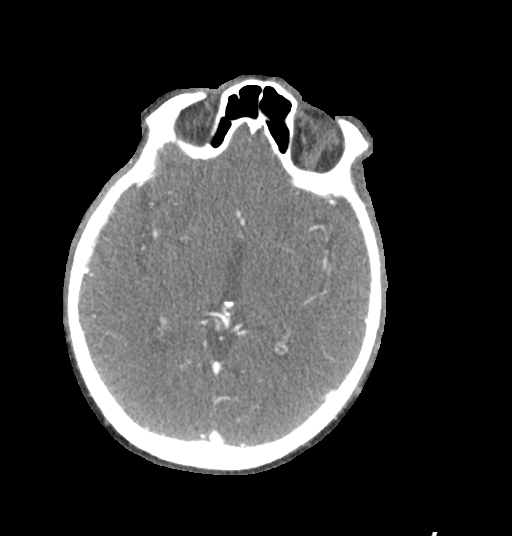

[7 of 33 positions shown; findings below may reference images not displayed]

FINDINGS: Aortic arch: Ectatic ascending thoracic aorta measuring up to 3.8 cm
in diameter. There is scattered atherosclerotic plaque within the
visualized aortic arch and proximal major branch vessels of the
neck. Included portions of the aortic arch demonstrate no evidence
of dissection or aneurysm.

Right carotid system: CCA and ICA patent within the neck without
significant stenosis (50% or greater). Predominantly noncalcified
plaque within the common and cervical internal carotid arteries.
Calcified atherosclerosis within the intracranial carotid artery
siphon with regions of no more than mild luminal narrowing.
Visualized right middle and anterior cerebral arteries patent
without significant proximal stenosis

Left carotid system: CCA and ICA patent within the neck without
significant stenosis (50% or greater). Predominantly noncalcified
plaque within the common and cervical internal carotid arteries.
Calcified atherosclerosis within the intracranial internal carotid
artery siphon. Mild-to-moderate stenosis of the supraclinoid left
internal carotid artery, with additional sites of mild stenosis.
Visualized left middle and anterior cerebral arteries patent without
significant proximal stenosis.

Vertebral arteries: Calcified plaque within the proximal V1 right
vertebral artery with resultant at least moderate stenosis. Distal
to this, the right vertebral artery is patent within the neck
without significant stenosis (50% or greater). The left vertebral
artery is patent within the neck without significant stenosis (50%
or greater). Mild plaque within the prox V1 left vertebral artery.
The intracranial vertebral arteries are patent to the basilar. The
basilar artery is patent without significant stenosis. The bilateral
posterior cerebral arteries are patent without significant proximal
stenosis.

Skeleton: Cervical spondylosis. Severe disc height loss at C3-C4,
C5-C6 and C6-C7. Multilevel posterior disc osteophytes,
uncovertebral and facet hypertrophy. There is also moderate to
severe intervertebral disc height loss at T1-T2 and T2-T3. No acute
bony abnormality.

Other neck: No neck mass. Multiple subcentimeter thyroid lobe
nodules.

Upper chest: No consolidation within the imaged lung apices.

Other: Partial opacification of the bilateral sphenoid sinuses. No
significant mastoid effusion
IMPRESSION: 1. No evidence of carotid or vertebral artery dissection.
2. The common and internal carotid arteries are patent within the
neck bilaterally without significant stenosis (50% or greater).
Atherosclerotic disease within the visualized intracranial ICA
siphons with sites of mild and mild-to-moderate stenosis as
described.
3. The bilateral vertebral arteries are patent within the neck.
Calcified plaque at the origin of the right vertebral artery with
resultant at least moderate stenosis. Otherwise, no significant
stenosis within the bilateral vertebral arteries (50% or greater).
4. Ectatic ascending aorta. Visualized portions measuring up to
cm in diameter.
5. Cervical and upper thoracic spondylosis as described with levels
of moderate/severe disc height loss.

## 2020-02-18 IMAGING — CR DG CHEST 2V
2 series · 2 of 2 positions shown · non-contrast
Comparison: 09/19/2016, 09/20/2014

CLINICAL DATA: Right-sided neck pain. Hypertension for the past
week with dizziness.

EXAM:
CHEST - 2 VIEW

[w pa chest]
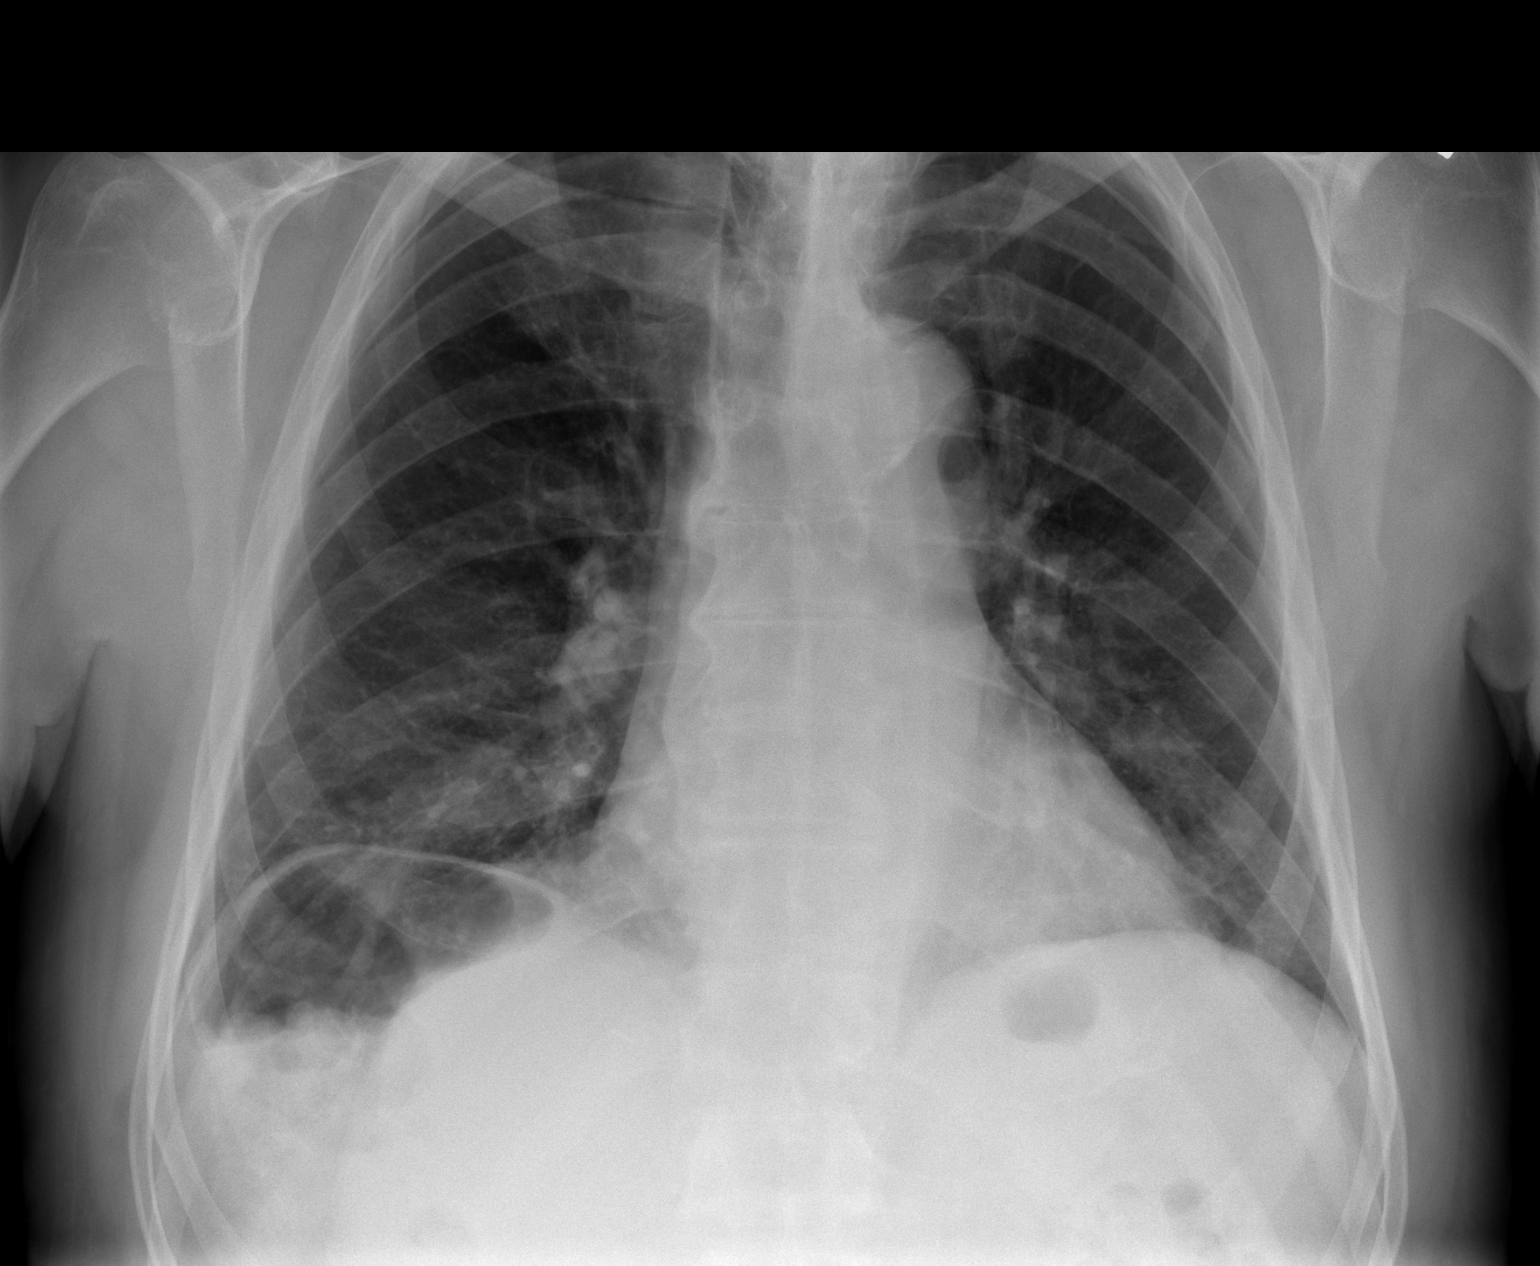

[w chest lat]
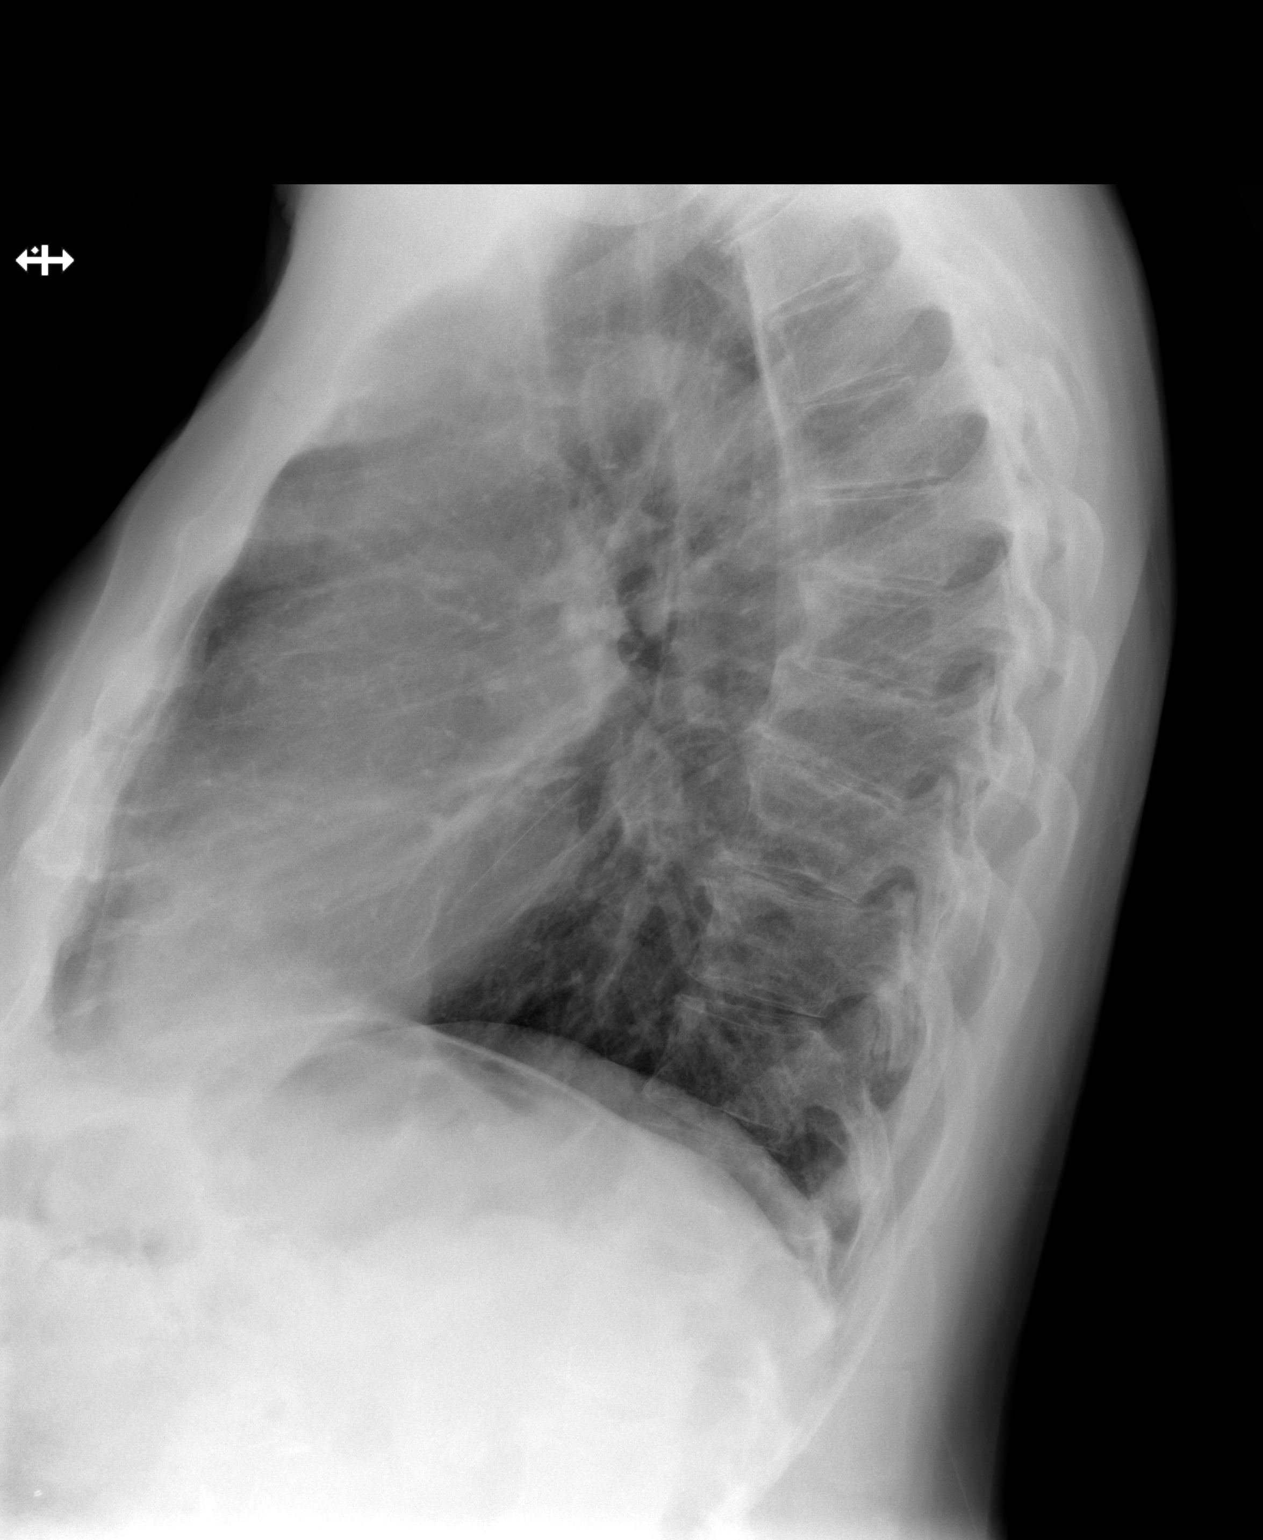

[2 of 2 positions shown; findings below may reference images not displayed]

FINDINGS: Lungs are adequately inflated and demonstrate subtle patchy density
over the lung bases better seen on the frontal film. Findings may be
due to infection versus vascular crowding. No effusion.
Cardiomediastinal silhouette and remainder of the exam is unchanged.
IMPRESSION: Subtle patchy bibasilar density which may be due to infection versus
vascular crowding.

## 2020-04-19 DIAGNOSIS — I10 Essential (primary) hypertension: Secondary | ICD-10-CM | POA: Diagnosis not present

## 2020-04-19 DIAGNOSIS — E785 Hyperlipidemia, unspecified: Secondary | ICD-10-CM | POA: Diagnosis not present

## 2020-05-03 DIAGNOSIS — Z789 Other specified health status: Secondary | ICD-10-CM | POA: Diagnosis not present

## 2020-05-03 DIAGNOSIS — I10 Essential (primary) hypertension: Secondary | ICD-10-CM | POA: Diagnosis not present

## 2020-05-03 DIAGNOSIS — C649 Malignant neoplasm of unspecified kidney, except renal pelvis: Secondary | ICD-10-CM | POA: Diagnosis not present

## 2020-05-03 DIAGNOSIS — I509 Heart failure, unspecified: Secondary | ICD-10-CM | POA: Diagnosis not present

## 2020-05-03 DIAGNOSIS — Z299 Encounter for prophylactic measures, unspecified: Secondary | ICD-10-CM | POA: Diagnosis not present

## 2020-06-17 DIAGNOSIS — Z299 Encounter for prophylactic measures, unspecified: Secondary | ICD-10-CM | POA: Diagnosis not present

## 2020-06-17 DIAGNOSIS — Z7189 Other specified counseling: Secondary | ICD-10-CM | POA: Diagnosis not present

## 2020-06-17 DIAGNOSIS — Z0000 Encounter for general adult medical examination without abnormal findings: Secondary | ICD-10-CM | POA: Diagnosis not present

## 2020-06-17 DIAGNOSIS — E785 Hyperlipidemia, unspecified: Secondary | ICD-10-CM | POA: Diagnosis not present

## 2020-06-17 DIAGNOSIS — I10 Essential (primary) hypertension: Secondary | ICD-10-CM | POA: Diagnosis not present

## 2020-06-20 DIAGNOSIS — Z79899 Other long term (current) drug therapy: Secondary | ICD-10-CM | POA: Diagnosis not present

## 2020-06-20 DIAGNOSIS — R5383 Other fatigue: Secondary | ICD-10-CM | POA: Diagnosis not present

## 2020-06-20 DIAGNOSIS — E7800 Pure hypercholesterolemia, unspecified: Secondary | ICD-10-CM | POA: Diagnosis not present

## 2020-07-27 ENCOUNTER — Ambulatory Visit (HOSPITAL_COMMUNITY)
Admission: RE | Admit: 2020-07-27 | Discharge: 2020-07-27 | Disposition: A | Discharge status: Home / Self Care | Payer: Medicare Other | Source: Ambulatory Visit | Attending: Physician Assistant | Admitting: Physician Assistant

## 2020-07-27 ENCOUNTER — Other Ambulatory Visit: Payer: Self-pay

## 2020-07-27 ENCOUNTER — Ambulatory Visit: Payer: Medicare Other | Admitting: Physician Assistant

## 2020-07-27 VITALS — BP 165/88 | HR 65 | Temp 97.6°F | Resp 20 | Ht 73.0 in | Wt 219.5 lb

## 2020-07-27 DIAGNOSIS — I6523 Occlusion and stenosis of bilateral carotid arteries: Secondary | ICD-10-CM | POA: Diagnosis not present

## 2020-07-27 NOTE — Progress Notes (Signed)
Office Note     CC:  follow up Requesting Provider:  Ralph Leyden, FNP  HPI: Darius Baxter is a 86 y.o. (April 24, 1934) male who presents for follow up of carotid disease. He has history of bilateral carotid endarterectomy with bovine pericardial patch. His left was performed in July of 2017 for symptomatic stenosis and the right was performed in October of 2019. This was for asymptomatic stenosis. He was last seen by Dr. Trula Slade in November of 2020 at which time he was doing very well with no new neurological symptoms.  He presents today for his 1 year follow up and to review his carotid duplex. He denies any amaurosis fugax, monocular blindness, slurred speech, facial drooping, weakness or numbness of upper or lower extremities.   He denies any claudication symptoms, rest pain or non healing wounds. He is having a lot of back pain secondary to arthritis. He also is having bilateral knee pain. He has had bilateral Total knee replacements and says they were better for a while and now they are hurting again  The pt is on a statin for cholesterol management.  The pt is on a daily aspirin.   Other AC: none The pt is on CCB, Hydralazine and BB for hypertension.   The pt is not diabetic.  Tobacco hx: never  Past Medical History:  Diagnosis Date  . Arthritis   . Carotid artery disease ([place])   . Chronic lower back pain   . Coronary artery disease    Mild at cardiac catheterization 2013  . Essential hypertension   . History of blood transfusion 2008; 2010  . Mitral valve prolapse    Mild mitral regurgitation  . Mixed hyperlipidemia   . Myocardial infarction ([place])    2013  . Nocturia   . Renal cancer [facility])    Status post right nephrectomy - 09/2006  . Seizures ([place])    Childhood-86 yrs old "passed out"  . Takotsubo cardiomyopathy    February 2013  . Ventricular ectopy    Bigeminy    Past Surgical History:  Procedure Laterality Date  . APPENDECTOMY    . BIOPSY  10/19/2019    Procedure: BIOPSY;  Surgeon: Danie Binder, MD;  [facility];  Service: Endoscopy;;  . CARDIAC CATHETERIZATION    . CAROTID ENDARTERECTOMY Left 02/23/2016  . CAROTID ENDARTERECTOMY Right 06/11/2018   PATCH ANGIOPLASTY WITH XENOSURE PATCH  . CATARACT EXTRACTION W/PHACO  03/22/2011   Procedure: CATARACT EXTRACTION PHACO AND INTRAOCULAR LENS PLACEMENT (IOC);  Surgeon: Tonny Branch;  Location: [facility] ORS;  Service: Ophthalmology;  Laterality: Right;  CDE  19.88  . CATARACT EXTRACTION W/PHACO  04/16/2011   Procedure: CATARACT EXTRACTION PHACO AND INTRAOCULAR LENS PLACEMENT (IOC);  Surgeon: Tonny Branch;  Location: [facility] ORS;  Service: Ophthalmology;  Laterality: Left;  CDE 21.13  . CHOLECYSTECTOMY  01/29/2012   Procedure: LAPAROSCOPIC CHOLECYSTECTOMY WITH INTRAOPERATIVE CHOLANGIOGRAM;  Surgeon: Joyice Faster. Cornett, MD;  Location: [place];  Service: General;  Laterality: N/A;  . ENDARTERECTOMY Left 02/23/2016   Procedure: LEFT CAROTID ENDARTERECTOMY WITH XENOSURE BOVINE PERICARDIUM PATCH ANGIOPLASTY;  Surgeon: Serafina Mitchell, MD;  Location: [place];  Service: Vascular;  Laterality: Left;  . ENDARTERECTOMY Right 06/11/2018   Procedure: ENDARTERECTOMY CAROTID RIGHT;  Surgeon: Serafina Mitchell, MD;  Location: [facility] OR;  Service: Vascular;  Laterality: Right;  . ESOPHAGOGASTRODUODENOSCOPY N/A 10/19/2019   one benign-appearing, intrinsic moderate stenosis s/p dilation. Moderate gastritis due to aspirin, mild duodenitis.   Marland Kitchen JOINT REPLACEMENT    .  KNEE ARTHROSCOPY Right   . LITHOTRIPSY    . NEPHRECTOMY Right 2008  . PATCH ANGIOPLASTY Right 06/11/2018   Procedure: PATCH ANGIOPLASTY WITH Weyman Pedro;  Surgeon: Serafina Mitchell, MD;  Location: [facility] OR;  Service: Vascular;  Laterality: Right;  . SAVORY DILATION N/A 10/19/2019   Procedure: SAVORY DILATION;  Surgeon: Danie Binder, MD;  [facility];  Service: Endoscopy;  Laterality: N/A;  . TOTAL KNEE ARTHROPLASTY  2010  . TOTAL KNEE ARTHROPLASTY Left 2014     Social History   Socioeconomic History  . Marital status: Married    Spouse name: Not on file  . Number of children: Not on file  . Years of education: Not on file  . Highest education level: Not on file  Occupational History  . Not on file  Tobacco Use  . Smoking status: Never Smoker  . Smokeless tobacco: Never Used  Vaping Use  . Vaping Use: Never used  Substance and Sexual Activity  . Alcohol use: No    Alcohol/week: 0.0 standard drinks  . Drug use: No  . Sexual activity: Yes    Birth control/protection: None  Other Topics Concern  . Not on file  Social History Narrative   Does not regularly exercise.    Social Determinants of Health   Financial Resource Strain:   . Difficulty of Paying Living Expenses: Not on file  Food Insecurity:   . Worried About Charity fundraiser in the Last Year: Not on file  . Ran Out of Food in the Last Year: Not on file  Transportation Needs:   . Lack of Transportation (Medical): Not on file  . Lack of Transportation (Non-Medical): Not on file  Physical Activity:   . Days of Exercise per Week: Not on file  . Minutes of Exercise per Session: Not on file  Stress:   . Feeling of Stress : Not on file  Social Connections:   . Frequency of Communication with Friends and Family: Not on file  . Frequency of Social Gatherings with Friends and Family: Not on file  . Attends Religious Services: Not on file  . Active Member of Clubs or Organizations: Not on file  . Attends Archivist Meetings: Not on file  . Marital Status: Not on file  Intimate Partner Violence:   . Fear of Current or Ex-Partner: Not on file  . Emotionally Abused: Not on file  . Physically Abused: Not on file  . Sexually Abused: Not on file    Family History  Problem Relation Age of Onset  . Cancer Other   . Stroke Other   . Coronary artery disease Other   . Hypertension Other   . Hypertension Mother   . Peripheral Artery Disease Mother        had leg  bypass, and died after surgery; believed to be from throwing a clot  . CVA Father   . Prostate cancer Father   . Anesthesia problems Neg Hx   . Hypotension Neg Hx   . Malignant hyperthermia Neg Hx   . Pseudochol deficiency Neg Hx     Current Outpatient Medications  Medication Sig Dispense Refill  . acetaminophen (TYLENOL) 500 MG tablet Take 1,000 mg by mouth daily as needed for moderate pain.    Marland Kitchen amLODipine (NORVASC) 10 MG tablet Take 10 mg by mouth daily.    . Artificial Tear Solution (SOOTHE XP OP) Place 1 drop into both eyes 3 (three) times daily.    Marland Kitchen  aspirin EC 81 MG tablet Take 1 tablet (81 mg total) by mouth daily. 30 tablet 3  . atorvastatin (LIPITOR) 40 MG tablet Take 40 mg by mouth at bedtime.     . carvedilol (COREG) 6.25 MG tablet Take by mouth at bedtime.    . Cholecalciferol (VITAMIN D) 2000 units tablet Take 2,000 Units by mouth daily.    . hydrALAZINE (APRESOLINE) 50 MG tablet Take 50 mg by mouth 2 (two) times daily.    Marland Kitchen HYDROcodone-acetaminophen (NORCO) 7.5-325 MG tablet Take 1 tablet by mouth as needed for moderate pain. Takes one tablet 2 times a week.    . olmesartan (BENICAR) 40 MG tablet Take 40 mg by mouth daily.    . pantoprazole (PROTONIX) 40 MG tablet Take 40 mg by mouth daily.     No current facility-administered medications for this visit.    Allergies  Allergen Reactions  . Nsaids     Patient has only 1 kidney and Doctor  Doesn't want him to take  . Vicodin [Hydrocodone-Acetaminophen] Nausea And Vomiting    IV only; no problems taking oral tablet     REVIEW OF SYSTEMS:  [X]  denotes positive finding, [ ]  denotes negative finding Cardiac  Comments:  Chest pain or chest pressure:    Shortness of breath upon exertion:    Short of breath when lying flat:    Irregular heart rhythm:        Vascular    Pain in calf, thigh, or hip brought on by ambulation:    Pain in feet at night that wakes you up from your sleep:     Blood clot in your veins:     Leg swelling:         Pulmonary    Oxygen at home:    Productive cough:     Wheezing:         Neurologic    Sudden weakness in arms or legs:     Sudden numbness in arms or legs:     Sudden onset of difficulty speaking or slurred speech:    Temporary loss of vision in one eye:     Problems with dizziness:         Gastrointestinal    Blood in stool:     Vomited blood:         Genitourinary    Burning when urinating:     Blood in urine:        Psychiatric    Major depression:         Hematologic    Bleeding problems:    Problems with blood clotting too easily:        Skin    Rashes or ulcers:        Constitutional    Fever or chills:      PHYSICAL EXAMINATION:  Vitals:   07/27/20 1238 07/27/20 1241  BP: (!) 160/89 (!) 165/88  Pulse: 65   Resp: 20   Temp: 97.6 F (36.4 C)   TempSrc: Temporal   SpO2: 96%   Weight: 219 lb 8 oz (99.6 kg)   Height: 6\' 1"  (1.854 m)    General:  WDWN very pleasant male; vital signs documented above Gait: Normal HENT: WNL, normocephalic Pulmonary: normal non-labored breathing , without Rales, rhonchi,  wheezing Cardiac: regular HR, with  Murmurs without carotid bruit Abdomen: soft, NT, no masses Vascular Exam/Pulses:  Right Left  Radial 2+ (normal) 2+ (normal)  Femoral 2+ (normal) 2+ (normal)  Popliteal  2+ (normal) 2+ (normal)  DP 2+ (normal) 2+ (normal)  PT 2+ (normal) 2+ (normal)   Extremities: without ischemic changes, without Gangrene , without cellulitis; without open wounds; bilateral lower extremity edema Musculoskeletal: no muscle wasting or atrophy  Neurologic: A&O X 3;  No focal weakness or paresthesias are detected Psychiatric:  The pt has Normal affect.   Non-Invasive Vascular Imaging:   Carotid duplex shows bilateral ICA velocities consistent with 1-39% stenosis. Bilateral vertebral arteries with antegrade flow. Normal flow in bilateral subclavian arteries   ASSESSMENT/PLAN:: 86 y.o. male here for follow  up for carotid disease. He has had surgical intervention of his bilateral carotid arteries. He has done well post operatively. He remains without any new neurological symptoms. His duplex today shows stable bilateral ICA stenosis of 1-39% and normal subclavian and vertebral artery flow. Discussed importance of good blood pressure control. - He will continue his aspirin and Statin - Reviewed signs and symptoms of TIA/ Stroke and patient understands that should these occur he should seek immediate medical attention -He will follow up in 1 year with repeat carotid duplex   Karoline Caldwell, PA-C [facility] [phone]  Clinic MD:   Dr. Donzetta Matters

## 2020-08-01 DIAGNOSIS — H4313 Vitreous hemorrhage, bilateral: Secondary | ICD-10-CM | POA: Diagnosis not present

## 2020-08-02 DIAGNOSIS — I10 Essential (primary) hypertension: Secondary | ICD-10-CM | POA: Diagnosis not present

## 2020-08-02 DIAGNOSIS — I429 Cardiomyopathy, unspecified: Secondary | ICD-10-CM | POA: Diagnosis not present

## 2020-08-02 DIAGNOSIS — I509 Heart failure, unspecified: Secondary | ICD-10-CM | POA: Diagnosis not present

## 2020-08-02 DIAGNOSIS — I700 Atherosclerosis of aorta: Secondary | ICD-10-CM | POA: Diagnosis not present

## 2020-08-02 DIAGNOSIS — Z299 Encounter for prophylactic measures, unspecified: Secondary | ICD-10-CM | POA: Diagnosis not present

## 2020-08-10 ENCOUNTER — Other Ambulatory Visit: Payer: Self-pay

## 2020-08-10 ENCOUNTER — Encounter: Payer: Self-pay | Admitting: Urology

## 2020-08-10 ENCOUNTER — Ambulatory Visit (INDEPENDENT_AMBULATORY_CARE_PROVIDER_SITE_OTHER): Payer: Medicare Other | Admitting: Urology

## 2020-08-10 VITALS — BP 169/90 | HR 82 | Temp 97.6°F | Ht 73.0 in | Wt 212.0 lb

## 2020-08-10 DIAGNOSIS — R351 Nocturia: Secondary | ICD-10-CM | POA: Diagnosis not present

## 2020-08-10 DIAGNOSIS — R972 Elevated prostate specific antigen [PSA]: Secondary | ICD-10-CM | POA: Diagnosis not present

## 2020-08-10 LAB — URINALYSIS, ROUTINE W REFLEX MICROSCOPIC
Bilirubin, UA: NEGATIVE
Glucose, UA: NEGATIVE
Ketones, UA: NEGATIVE
Leukocytes,UA: NEGATIVE
Nitrite, UA: NEGATIVE
Protein,UA: NEGATIVE
RBC, UA: NEGATIVE
Specific Gravity, UA: 1.02 (ref 1.005–1.030)
Urobilinogen, Ur: 0.2 mg/dL (ref 0.2–1.0)
pH, UA: 5 (ref 5.0–7.5)

## 2020-08-10 LAB — BLADDER SCAN AMB NON-IMAGING: Scan Result: 85

## 2020-08-10 NOTE — Progress Notes (Signed)
Bladder Scan Patient can void: 85 ml Performed By: Toluwani Ruder,lpn  Urological Symptom Review  Patient is experiencing the following symptoms: Frequent urination Get up at night to urinate Leakage of urine Erection problems (male only)   Review of Systems  Gastrointestinal (upper)  : Negative for upper GI symptoms  Gastrointestinal (lower) : Constipation  Constitutional : Negative for symptoms  Skin: Negative for skin symptoms  Eyes: Negative for eye symptoms  Ear/Nose/Throat : Negative for Ear/Nose/Throat symptoms  Hematologic/Lymphatic: Easy bruising  Cardiovascular : Negative for cardiovascular symptoms  Respiratory : Negative for respiratory symptoms  Endocrine: Negative for endocrine symptoms  Musculoskeletal: Back/joint pain  Neurological: Dizziness  Psychologic: Negative for psychiatric symptoms

## 2020-08-10 NOTE — Progress Notes (Signed)
08/10/2020 3:48 PM   Darius Baxter [birth year] [id number]  Referring provider: Golden Pop, FNP [address] [place],  [place] [address]  Elevated PSA  HPI: Mr Agro is a 86 you here for evaluation of elevated PSA. His PSA was 8.6 4 months ago. Last year is was 6.3 and then 7.5. His father died of prostate cancer in his mid 21s. He has mild LUTS on no BPH therapy. He has nocturia 3-4x. He is not bothered by his LUTS   PMH: Past Medical History:  Diagnosis Date  . Arthritis   . Cancer of kidney ([facility])   . Carotid artery disease ([facility])   . Chronic lower back pain   . Coronary artery disease    Mild at cardiac catheterization 2013  . Essential hypertension   . History of blood transfusion 2008; 2010  . Mitral valve prolapse    Mild mitral regurgitation  . Mixed hyperlipidemia   . Myocardial infarction ([facility])    2013  . Nocturia   . Renal cancer [facility])    Status post right nephrectomy - 09/2006  . Seizures ([facility])    Childhood-86 yrs old "passed out"  . Takotsubo cardiomyopathy    February 2013  . Ventricular ectopy    Bigeminy    Surgical History: Past Surgical History:  Procedure Laterality Date  . APPENDECTOMY    . BIOPSY  10/19/2019   Procedure: BIOPSY;  Surgeon: West Bali, MD;  [facility];  Service: Endoscopy;;  . CARDIAC CATHETERIZATION    . CAROTID ENDARTERECTOMY Left 02/23/2016  . CAROTID ENDARTERECTOMY Right 06/11/2018   PATCH ANGIOPLASTY WITH XENOSURE PATCH  . CATARACT EXTRACTION W/PHACO  03/22/2011   Procedure: CATARACT EXTRACTION PHACO AND INTRAOCULAR LENS PLACEMENT (IOC);  Surgeon: Gemma Payor;  Location: [facility];  Service: Ophthalmology;  Laterality: Right;  CDE  19.88  . CATARACT EXTRACTION W/PHACO  04/16/2011   Procedure: CATARACT EXTRACTION PHACO AND INTRAOCULAR LENS PLACEMENT (IOC);  Surgeon: Gemma Payor;  Location: [facility];  Service: Ophthalmology;  Laterality: Left;  CDE 21.13  . CHOLECYSTECTOMY  01/29/2012   Procedure: LAPAROSCOPIC  CHOLECYSTECTOMY WITH INTRAOPERATIVE CHOLANGIOGRAM;  Surgeon: Clovis Pu. Cornett, MD;  Location: [facility] OR;  Service: General;  Laterality: N/A;  . ENDARTERECTOMY Left 02/23/2016   Procedure: LEFT CAROTID ENDARTERECTOMY WITH XENOSURE BOVINE PERICARDIUM PATCH ANGIOPLASTY;  Surgeon: Nada Libman, MD;  Location: [facility] OR;  Service: Vascular;  Laterality: Left;  . ENDARTERECTOMY Right 06/11/2018   Procedure: ENDARTERECTOMY CAROTID RIGHT;  Surgeon: Nada Libman, MD;  Location: [facility] OR;  Service: Vascular;  Laterality: Right;  . ESOPHAGOGASTRODUODENOSCOPY N/A 10/19/2019   one benign-appearing, intrinsic moderate stenosis s/p dilation. Moderate gastritis due to aspirin, mild duodenitis.   Marland Kitchen JOINT REPLACEMENT    . KNEE ARTHROSCOPY Right   . LITHOTRIPSY    . NEPHRECTOMY Right 2008  . PATCH ANGIOPLASTY Right 06/11/2018   Procedure: PATCH ANGIOPLASTY WITH Kathleen Lime;  Surgeon: Nada Libman, MD;  Location: [facility] OR;  Service: Vascular;  Laterality: Right;  . SAVORY DILATION N/A 10/19/2019   Procedure: SAVORY DILATION;  Surgeon: West Bali, MD;  [facility];  Service: Endoscopy;  Laterality: N/A;  . TOTAL KNEE ARTHROPLASTY  2010  . TOTAL KNEE ARTHROPLASTY Left 2014    Home Medications:  Allergies as of 08/10/2020      Reactions   Nsaids    Patient has only 1 kidney and Doctor  Doesn't want him to take   Vicodin [hydrocodone-acetaminophen] Nausea  And Vomiting   IV only; no problems taking oral tablet      Medication List       Accurate as of August 10, 2020  3:48 PM. If you have any questions, ask your nurse or doctor.        acetaminophen 500 MG tablet Commonly known as: TYLENOL Take 1,000 mg by mouth daily as needed for moderate pain.   amLODipine 10 MG tablet Commonly known as: NORVASC Take 10 mg by mouth daily.   aspirin EC 81 MG tablet Take 1 tablet (81 mg total) by mouth daily.   atorvastatin 40 MG tablet Commonly known as: LIPITOR Take 40 mg by mouth at  bedtime.   carvedilol 6.25 MG tablet Commonly known as: COREG Take by mouth at bedtime.   hydrALAZINE 50 MG tablet Commonly known as: APRESOLINE Take 50 mg by mouth 2 (two) times daily.   HYDROcodone-acetaminophen 7.5-325 MG tablet Commonly known as: NORCO Take 1 tablet by mouth as needed for moderate pain. Takes one tablet 2 times a week.   olmesartan 40 MG tablet Commonly known as: BENICAR Take 40 mg by mouth daily.   pantoprazole 40 MG tablet Commonly known as: PROTONIX Take 40 mg by mouth daily.   SOOTHE XP OP Place 1 drop into both eyes 3 (three) times daily.   Vitamin D 50 MCG (2000 UT) tablet Take 2,000 Units by mouth daily.       Allergies:  Allergies  Allergen Reactions  . Nsaids     Patient has only 1 kidney and Doctor  Doesn't want him to take  . Vicodin [Hydrocodone-Acetaminophen] Nausea And Vomiting    IV only; no problems taking oral tablet    Family History: Family History  Problem Relation Age of Onset  . Cancer Other   . Stroke Other   . Coronary artery disease Other   . Hypertension Other   . Hypertension Mother   . Peripheral Artery Disease Mother        had leg bypass, and died after surgery; believed to be from throwing a clot  . CVA Father   . Prostate cancer Father   . Anesthesia problems Neg Hx   . Hypotension Neg Hx   . Malignant hyperthermia Neg Hx   . Pseudochol deficiency Neg Hx     Social History:  reports that he has never smoked. He has never used smokeless tobacco. He reports that he does not drink alcohol and does not use drugs.  ROS: All other review of systems were reviewed and are negative except what is noted above in HPI  Physical Exam: BP (!) 169/90   Pulse 82   Temp 97.6 F (36.4 C)   Ht 6\' 1"  (1.854 m)   Wt 212 lb (96.2 kg)   BMI 27.97 kg/m   Constitutional:  Alert and oriented, No acute distress. HEENT: [place] AT, moist mucus membranes.  Trachea midline, no masses. Cardiovascular: No clubbing, cyanosis, or  edema. Respiratory: Normal respiratory effort, no increased work of breathing. GI: Abdomen is soft, nontender, nondistended, no abdominal masses GU: No CVA tenderness. Circumcised phallus. No masses/lesions on penis, testis, scrotum. Prostate 40g smooth no nodules no induration.  Lymph: No cervical or inguinal lymphadenopathy. Skin: No rashes, bruises or suspicious lesions. Neurologic: Grossly intact, no focal deficits, moving all 4 extremities. Psychiatric: Normal mood and affect.  Laboratory Data: Lab Results  Component Value Date   WBC 7.0 05/23/2019   HGB 15.2 05/23/2019   HCT 47.6 05/23/2019  MCV 97.3 05/23/2019   PLT 155 05/23/2019    Lab Results  Component Value Date   CREATININE 0.91 05/23/2019    No results found for: PSA  No results found for: TESTOSTERONE  No results found for: HGBA1C  Urinalysis    Component Value Date/Time   COLORURINE YELLOW 06/05/2018 [place] 06/05/2018 1102   LABSPEC 1.009 06/05/2018 1102   PHURINE 5.0 06/05/2018 1102   GLUCOSEU NEGATIVE 06/05/2018 1102   HGBUR SMALL (A) 06/05/2018 1102   BILIRUBINUR NEGATIVE 06/05/2018 [place] 06/05/2018 1102   PROTEINUR NEGATIVE 06/05/2018 1102   NITRITE NEGATIVE 06/05/2018 1102   LEUKOCYTESUR NEGATIVE 06/05/2018 1102    Lab Results  Component Value Date   BACTERIA RARE (A) 06/05/2018    Pertinent Imaging:  No results found for this or any previous visit.  No results found for this or any previous visit.  No results found for this or any previous visit.  No results found for this or any previous visit.  No results found for this or any previous visit.  No results found for this or any previous visit.  No results found for this or any previous visit.  No results found for this or any previous visit.   Assessment & Plan:    1. Elevated PSA -PSA today, if below 10, I will see him back in 6 months with a PSA - Urinalysis, Routine w reflex  microscopic - BLADDER SCAN AMB NON-IMAGING  2. Nocturia -Patient defers therapy at this time.    No follow-ups on file.  Nicolette Bang, MD  [facility] [place]

## 2020-08-10 NOTE — Patient Instructions (Signed)
Prostate-Specific Antigen Test Why am I having this test? The prostate-specific antigen (PSA) test is a screening test for prostate cancer. It can identify early signs of prostate cancer, which may allow for more effective treatment. Your health care provider may recommend that you have a PSA test starting at age 84 or that you have one earlier or later, depending on your risk factors for prostate cancer. You may also have a PSA test:  To monitor treatment of prostate cancer.  To check whether prostate cancer has returned after treatment.  If you have signs of other conditions that can affect PSA levels, such as: ? An enlarged prostate that is not caused by cancer (benign prostatic hyperplasia, BPH). This condition is very common in older men. ? A prostate infection. What is being tested? This test measures the amount of PSA in your blood. PSA is a protein that is made in the prostate. The prostate naturally produces more PSA as you age, but very high levels may be a sign of a medical condition. What kind of sample is taken?  A blood sample is required for this test. It is usually collected by inserting a needle into a blood vessel or by sticking a finger with a small needle. Blood for this test should be drawn before having an exam of the prostate. How do I prepare for this test? Do not ejaculate starting 24 hours before your test, or as long as told by your health care provider. Tell a health care provider about:  Any allergies you have.  All medicines you are taking, including vitamins, herbs, eye drops, creams, and over-the-counter medicines. This also includes: ? Medicines to assist with hair growth, such as finasteride. ? Any recent exposure to a medicine called diethylstilbestrol.  Any blood disorders you have.  Any recent procedures you have had, especially any procedures involving the prostate or rectum.  Any medical conditions you have.  Any recent urinary tract infections  (UTIs) you have had. How are the results reported? Your test results will be reported as a value that indicates how much PSA is in your blood. This will be given as nanograms of PSA per milliliter of blood (ng/mL). Your health care provider will compare your results to normal ranges that were established after testing a large group of people (reference ranges). Reference ranges may vary among labs and hospitals. PSA levels vary from person to person and generally increase with age. Because of this variation, there is no single PSA value that is considered normal for everyone. Instead, PSA reference ranges are used to describe whether your PSA levels are considered low or high (elevated). Common reference ranges are:  Low: 0-2.5 ng/mL.  Slightly to moderately elevated: 2.6-10.0 ng/mL.  Moderately elevated: 10.0-19.9 ng/mL.  Significantly elevated: 20 ng/mL or greater. Sometimes, the test results may report that a condition is present when it is not present (false-positive result). What do the results mean? A test result that is higher than 4 ng/mL may mean that you are at an increased risk for prostate cancer. However, a PSA test by itself is not enough to diagnose prostate cancer. High PSA levels may also be caused by the natural aging process, prostate infection, or BPH. PSA screening cannot tell you if your PSA is high due to cancer or a different cause. A prostate biopsy is the only way to diagnose prostate cancer. A risk of having the PSA test is diagnosing and treating prostate cancer that would never have caused any   symptoms or problems (overdiagnosis and overtreatment). Talk with your health care provider about what your results mean. Questions to ask your health care provider Ask your health care provider, or the department that is doing the test:  When will my results be ready?  How will I get my results?  What are my treatment options?  What other tests do I need?  What are my  next steps? Summary  The prostate-specific antigen (PSA) test is a screening test for prostate cancer.  Your health care provider may recommend that you have a PSA test starting at age 84 or that you have one earlier or later, depending on your risk factors for prostate cancer.  A test result that is higher than 4 ng/mL may mean that you are at an increased risk for prostate cancer. However, elevated levels can be caused by a number of conditions other than prostate cancer.  Talk with your health care provider about what your results mean. This information is not intended to replace advice given to you by your health care provider. Make sure you discuss any questions you have with your health care provider. Document Revised: 07/19/2017 Document Reviewed: 05/13/2017 Elsevier Patient Education  2020 [employer]  

## 2020-08-11 LAB — PSA: Prostate Specific Ag, Serum: 7.2 ng/mL — ABNORMAL HIGH (ref 0.0–4.0)

## 2020-08-16 ENCOUNTER — Telehealth: Payer: Self-pay

## 2020-08-16 NOTE — Telephone Encounter (Signed)
Pt notified of PSA results. 

## 2020-08-16 NOTE — Telephone Encounter (Signed)
-----   Message from Malen Gauze, MD sent at 08/16/2020 11:05 AM EST ----- 7.2 is normal for his age. I will see him back in 6 months with a PSA ----- Message ----- From: Dalia Heading, LPN Sent: 19/14/7829  10:08 AM EST To: Malen Gauze, MD  Pls review.

## 2020-08-19 DIAGNOSIS — I10 Essential (primary) hypertension: Secondary | ICD-10-CM | POA: Diagnosis not present

## 2020-08-19 DIAGNOSIS — E785 Hyperlipidemia, unspecified: Secondary | ICD-10-CM | POA: Diagnosis not present

## 2020-09-19 DIAGNOSIS — E785 Hyperlipidemia, unspecified: Secondary | ICD-10-CM | POA: Diagnosis not present

## 2020-09-19 DIAGNOSIS — I10 Essential (primary) hypertension: Secondary | ICD-10-CM | POA: Diagnosis not present

## 2020-10-17 DIAGNOSIS — E785 Hyperlipidemia, unspecified: Secondary | ICD-10-CM | POA: Diagnosis not present

## 2020-10-17 DIAGNOSIS — I10 Essential (primary) hypertension: Secondary | ICD-10-CM | POA: Diagnosis not present

## 2020-11-01 DIAGNOSIS — I429 Cardiomyopathy, unspecified: Secondary | ICD-10-CM | POA: Diagnosis not present

## 2020-11-01 DIAGNOSIS — Z299 Encounter for prophylactic measures, unspecified: Secondary | ICD-10-CM | POA: Diagnosis not present

## 2020-11-01 DIAGNOSIS — I10 Essential (primary) hypertension: Secondary | ICD-10-CM | POA: Diagnosis not present

## 2020-11-01 DIAGNOSIS — Z789 Other specified health status: Secondary | ICD-10-CM | POA: Diagnosis not present

## 2020-11-01 DIAGNOSIS — I509 Heart failure, unspecified: Secondary | ICD-10-CM | POA: Diagnosis not present

## 2020-11-01 DIAGNOSIS — K219 Gastro-esophageal reflux disease without esophagitis: Secondary | ICD-10-CM | POA: Diagnosis not present

## 2020-12-17 DIAGNOSIS — I10 Essential (primary) hypertension: Secondary | ICD-10-CM | POA: Diagnosis not present

## 2020-12-17 DIAGNOSIS — E785 Hyperlipidemia, unspecified: Secondary | ICD-10-CM | POA: Diagnosis not present

## 2020-12-21 ENCOUNTER — Ambulatory Visit: Payer: Medicare Other | Admitting: Cardiology

## 2020-12-21 NOTE — Progress Notes (Addendum)
Cardiology Office Note  Date: 12/22/2020   ID: RANJIT ASHURST, DOB 1934-05-11, MRN [id number]  PCP:  Ralph Leyden, FNP  Cardiologist:  Rozann Lesches, MD Electrophysiologist:  None   Chief Complaint: Follow-up, stress-induced cardiomyopathy (Takotsubo's), mild CAD back cardiac catheterization 2013 (MI 2013), carotid artery disease (bilateral endarterectomies), HLD, HTN,  History of Present Illness: Darius Baxter is a 87 y.o. male with a history of stress-induced cardiomyopathy (Takotsubo's), mild CAD by cardiac catheterization 2013 (MI 2013), carotid artery disease (bilateral endarterectomies), HLD, HTN.  Last saw Dr. Domenic Polite April 24, 2019.  At that visit he had not complained of any exertional chest pain or worsening shortness of breath.  He was limited by back and knee pain but performanced all ADLs including yard work.  History of carotid artery disease with bilateral carotid endarterectomies.  His blood pressure was elevated at last visit.  He had a blood pressure cuff at home but does not check BP regularly.  Dr. Domenic Polite encouraged him to check his blood pressure daily for 2 weeks prior to his pending visit with PCP and stated he may need further up titration of his antihypertensive medication.  He is here today for 1 year follow-up.  He denies any recent acute illnesses or hospitalizations.  He states he had a stressful event in September 08, 2022 when his daughter died of a sudden heart attack.  He states he and his wife both are having some significant issues with grief and stress from this event.  He states he has lost some weight as a result due to not eating.  He denies any recent anginal or exertional symptoms, DOE, SOB, PND or orthopnea.  States he has occasional transient lightheadedness which she attributes to some of the medication he is taking.  Otherwise denies any near syncopal or syncopal episodes.  Blood pressure is well controlled on current medications.  Blood pressure  today 128/62.  His weight is remaining stable around the 212-214 range.  Today's weight is 214.  He does have some chronic lower extremity edema for which she takes Lasix 20 mg daily.  He denies any claudication-like symptoms but does have bilateral knee pain from arthritis and previous knee replacements.  He denies any CVA or TIA-like symptoms.  He has had previous carotid endarterectomies and has seen Dr. Trula Slade in the past.  Last carotid studies demonstrated 1 to 39% bilateral ICA stenosis.  States he is compliant with all of his medications.   Past Medical History:  Diagnosis Date  . Arthritis   . Cancer of kidney ([place])   . Carotid artery disease ([place])   . Chronic lower back pain   . Coronary artery disease    Mild at cardiac catheterization 2013  . Essential hypertension   . History of blood transfusion 2008; 2010  . Mitral valve prolapse    Mild mitral regurgitation  . Mixed hyperlipidemia   . Myocardial infarction ([place])    2013  . Nocturia   . Renal cancer [facility])    Status post right nephrectomy - 09/2006  . Seizures ([place])    Childhood-87 yrs old "passed out"  . Takotsubo cardiomyopathy    February 2013  . Ventricular ectopy    Bigeminy    Past Surgical History:  Procedure Laterality Date  . APPENDECTOMY    . BIOPSY  10/19/2019   Procedure: BIOPSY;  Surgeon: Danie Binder, MD;  Location: [facility];  Service: Endoscopy;;  . CARDIAC CATHETERIZATION    .  CAROTID ENDARTERECTOMY Left 02/23/2016  . CAROTID ENDARTERECTOMY Right 06/11/2018   PATCH ANGIOPLASTY WITH XENOSURE PATCH  . CATARACT EXTRACTION W/PHACO  03/22/2011   Procedure: CATARACT EXTRACTION PHACO AND INTRAOCULAR LENS PLACEMENT (IOC);  Surgeon: Tonny Branch;  Location: [facility] ORS;  Service: Ophthalmology;  Laterality: Right;  CDE  19.88  . CATARACT EXTRACTION W/PHACO  04/16/2011   Procedure: CATARACT EXTRACTION PHACO AND INTRAOCULAR LENS PLACEMENT (IOC);  Surgeon: Tonny Branch;  Location: [facility];  Service: Ophthalmology;   Laterality: Left;  CDE 21.13  . CHOLECYSTECTOMY  01/29/2012   Procedure: LAPAROSCOPIC CHOLECYSTECTOMY WITH INTRAOPERATIVE CHOLANGIOGRAM;  Surgeon: Joyice Faster. Cornett, MD;  Location: [facility];  Service: General;  Laterality: N/A;  . ENDARTERECTOMY Left 02/23/2016   Procedure: LEFT CAROTID ENDARTERECTOMY WITH XENOSURE BOVINE PERICARDIUM PATCH ANGIOPLASTY;  Surgeon: Serafina Mitchell, MD;  Location: [facility];  Service: Vascular;  Laterality: Left;  . ENDARTERECTOMY Right 06/11/2018   Procedure: ENDARTERECTOMY CAROTID RIGHT;  Surgeon: Serafina Mitchell, MD;  Location[facility];  Service: Vascular;  Laterality: Right;  . ESOPHAGOGASTRODUODENOSCOPY N/A 10/19/2019   one benign-appearing, intrinsic moderate stenosis s/p dilation. Moderate gastritis due to aspirin, mild duodenitis.   Marland Kitchen JOINT REPLACEMENT    . KNEE ARTHROSCOPY Right   . LITHOTRIPSY    . NEPHRECTOMY Right 2008  . PATCH ANGIOPLASTY Right 06/11/2018   Procedure: PATCH ANGIOPLASTY WITH Weyman Pedro;  Surgeon: Serafina Mitchell, MD;  Location[facility] OR;  Service: Vascular;  Laterality: Right;  . SAVORY DILATION N/A 10/19/2019   Procedure: SAVORY DILATION;  Surgeon: Danie Binder, MD;  [facility];  Service: Endoscopy;  Laterality: N/A;  . TOTAL KNEE ARTHROPLASTY  2010  . TOTAL KNEE ARTHROPLASTY Left 2014    Current Outpatient Medications  Medication Sig Dispense Refill  . acetaminophen (TYLENOL) 500 MG tablet Take 1,000 mg by mouth daily as needed for moderate pain.    Marland Kitchen amLODipine (NORVASC) 10 MG tablet Take 10 mg by mouth daily.    . Artificial Tear Solution (SOOTHE XP OP) Place 1 drop into both eyes 3 (three) times daily.    Marland Kitchen aspirin EC 81 MG tablet Take 1 tablet (81 mg total) by mouth daily. 30 tablet 3  . atorvastatin (LIPITOR) 40 MG tablet Take 40 mg by mouth at bedtime.     . carvedilol (COREG) 6.25 MG tablet Take 3.125 mg by mouth 2 (two) times daily with a meal.    . Cholecalciferol (VITAMIN D) 2000 units tablet Take 2,000 Units by  mouth daily.    . furosemide (LASIX) 20 MG tablet Take 20 mg by mouth daily.    . hydrALAZINE (APRESOLINE) 50 MG tablet Take 50 mg by mouth 2 (two) times daily.    Marland Kitchen HYDROcodone-acetaminophen (NORCO) 7.5-325 MG tablet Take 1 tablet by mouth as needed for moderate pain. Takes one tablet 2 times a week.    . olmesartan (BENICAR) 40 MG tablet Take 40 mg by mouth daily.    . pantoprazole (PROTONIX) 40 MG tablet Take 40 mg by mouth every other day.     No current facility-administered medications for this visit.   Allergies:  Nsaids and Vicodin [hydrocodone-acetaminophen]   Social History: The patient  reports that he has never smoked. He has never used smokeless tobacco. He reports that he does not drink alcohol and does not use drugs.   Family History: The patient's family history includes CVA in his father; Cancer in an other family member; Coronary artery disease in an other family  member; Hypertension in his mother and another family member; Peripheral Artery Disease in his mother; Prostate cancer in his father; Stroke in an other family member.   ROS:  Please see the history of present illness. Otherwise, complete review of systems is positive for none.  All other systems are reviewed and negative.   Physical Exam: VS:  BP 128/62   Pulse 76   Ht 6' (1.829 m)   Wt 214 lb (97.1 kg)   SpO2 94%   BMI 29.02 kg/m , BMI Body mass index is 29.02 kg/m.  Wt Readings from Last 3 Encounters:  12/22/20 214 lb (97.1 kg)  08/10/20 212 lb (96.2 kg)  07/27/20 219 lb 8 oz (99.6 kg)    General: Patient appears comfortable at rest. Neck: Supple, no elevated JVP or carotid bruits, no thyromegaly. Lungs: Clear to auscultation, nonlabored breathing at rest. Cardiac: Regular rate and rhythm, no S3 or significant systolic murmur, no pericardial rub. Extremities: Mild pitting edema, distal pulses 2+. Skin: Warm and dry. Musculoskeletal: No kyphosis. Neuropsychiatric: Alert and oriented x3, affect  grossly appropriate.  ECG:  An ECG dated 12/22/2020 was personally reviewed today and demonstrated:  Normal sinus rhythm rate of 73.  No ST or T wave abnormalities.  No ectopy noted.  Recent Labwork: No results found for requested labs within last 8760 hours.     Component Value Date/Time   CHOL 143 10/06/2011 0530   TRIG 310 (H) 10/06/2011 0530   HDL 40 10/06/2011 0530   CHOLHDL 3.6 10/06/2011 0530   VLDL 62 (H) 10/06/2011 0530   LDLCALC 41 10/06/2011 0530    Other Studies Reviewed Today:  Carotid artery duplex study 07/27/2020 Right Carotid: Velocities in the right ICA are consistent with a 1-39% stenosis Left Carotid: Velocities in the left ICA are consistent with a 1-39% stenosis. Vertebrals: Bilateral vertebral arteries demonstrate antegrade flow. Subclavians: Normal flow hemodynamics were seen in bilateral subclavian arteries.   Carotid artery duplex 07/20/2019 Right Carotid: Velocities in the right ICA are consistent with a 1-39% stenosis. Nonhemodynamically significant plaque <50% noted in the CCA. The ECA appears <50% stenosed. Patent right carotid endarterecotomy with non-hemodynamically significant plaque noted. Left Carotid: Velocities in the left ICA are consistent with a 1-39% stenosis. Nonhemodynamically significant plaque <50% noted in the CCA. The ECA appears <50% stenosed. Patent left carotid endarterectomy with non-hemodynamically significant plaque noted. Vertebrals: Bilateral vertebral arteries demonstrate antegrade flow. Subclavians: Normal flow hemodynamics were seen in bilateral subclavian arteries.    Echocardiogram 03/20/2018: Study Conclusions  - Left ventricle: The cavity size was normal. Wall thickness was increased in a pattern of mild LVH. Systolic function was normal. The estimated ejection fraction was in the range of 55% to 60%. Wall motion was normal; there were no regional wall motion abnormalities. Doppler parameters are  consistent with abnormal left ventricular relaxation (grade 1 diastolic dysfunction). - Aortic valve: Mildly calcified annulus. Trileaflet. There was trivial regurgitation. - Mitral valve: Mildly calcified annulus. There was mild regurgitation. - Right atrium: Central venous pressure (est): 3 mm Hg. - Atrial septum: No defect or patent foramen ovale was identified. - Tricuspid valve: There was trivial regurgitation. - Pulmonary arteries: PA peak pressure: 18 mm Hg (S). - Pericardium, extracardiac: There was no pericardial effusion.  Carotid Dopplers 05/22/2018: Final Interpretation: Right Carotid: Velocities in the right ICA are consistent with a 80-99% stenosis.  Left Carotid: Velocities in the left ICA are consistent with a 1-39% stenosis. The extracranial vessels were near-normal with only minimal  wall thickening or plaque.  Vertebrals: Bilateral vertebral arteries demonstrate antegrade flow. Subclavians: Normal flow hemodynamics were seen in bilateral subclavian arteries.  Assessment and Plan:  1. Takotsubo cardiomyopathy   2. Carotid stenosis, bilateral   3. Essential hypertension   4. Mixed hyperlipidemia    1. Takotsubo cardiomyopathy Resolved on echo August 2019; mild LVH, EF 55 to 60%.  Grade 1 DD, trivial TR, mild MR, trivial AR .  Patient denies any progressive anginal or exertional symptoms.  We will continue to monitor.  2. Carotid stenosis, bilateral Carotid endarterectomy left on 02/23/2016, carotid endarterectomy on the right 06/11/2018.  Patient denies any issues related to carotid artery stenosis of recent carotid artery duplex showed mild bilateral ICA stenosis of 1 to 39%.  3. Essential hypertension Patient brings with him a log of blood pressures and it appears that his systolic is averaging in the 130s.  He states he was told to stop his carvedilol completely by the nurse at PCP office.  Advised patient to  continue carvedilol at 3.125 mg p.o. twice daily.  Continue amlodipine 10 mg daily, continue hydralazine 50 mg p.o. twice daily, continue olmesartan 40 mg daily.  He does have some mild bilateral lower extremity edema which could be attributed to max dose of amlodipine.  He is wearing compression stockings.   4. Mixed hyperlipidemia Continue atorvastatin 40 mg p.o. daily.  Patient states he will have lab work upcoming soon.  Asked him if he would have primary care provider send those results to [place].  He stated he would do that.   Medication Adjustments/Labs and Tests Ordered: Current medicines are reviewed at length with the patient today.  Concerns regarding medicines are outlined above.   Disposition: Follow-up with Dr. Domenic Polite or APP 1 year Signed, Levell July, NP 12/22/2020 2:10 PM    [facility] [place] [place], [place], [place] [address] Phone: [phone]; Fax: [phone]

## 2020-12-22 ENCOUNTER — Ambulatory Visit: Payer: Medicare Other | Admitting: Family Medicine

## 2020-12-22 ENCOUNTER — Encounter: Payer: Self-pay | Admitting: Family Medicine

## 2020-12-22 VITALS — BP 128/62 | HR 76 | Ht 72.0 in | Wt 214.0 lb

## 2020-12-22 DIAGNOSIS — I5181 Takotsubo syndrome: Secondary | ICD-10-CM

## 2020-12-22 DIAGNOSIS — I10 Essential (primary) hypertension: Secondary | ICD-10-CM | POA: Diagnosis not present

## 2020-12-22 DIAGNOSIS — I6523 Occlusion and stenosis of bilateral carotid arteries: Secondary | ICD-10-CM | POA: Diagnosis not present

## 2020-12-22 DIAGNOSIS — E782 Mixed hyperlipidemia: Secondary | ICD-10-CM

## 2020-12-22 NOTE — Patient Instructions (Signed)

## 2021-01-19 DIAGNOSIS — Z299 Encounter for prophylactic measures, unspecified: Secondary | ICD-10-CM | POA: Diagnosis not present

## 2021-01-19 DIAGNOSIS — Z0000 Encounter for general adult medical examination without abnormal findings: Secondary | ICD-10-CM | POA: Diagnosis not present

## 2021-01-19 DIAGNOSIS — Z7189 Other specified counseling: Secondary | ICD-10-CM | POA: Diagnosis not present

## 2021-01-19 DIAGNOSIS — I509 Heart failure, unspecified: Secondary | ICD-10-CM | POA: Diagnosis not present

## 2021-01-19 DIAGNOSIS — R52 Pain, unspecified: Secondary | ICD-10-CM | POA: Diagnosis not present

## 2021-01-20 DIAGNOSIS — Z79899 Other long term (current) drug therapy: Secondary | ICD-10-CM | POA: Diagnosis not present

## 2021-01-20 DIAGNOSIS — R5383 Other fatigue: Secondary | ICD-10-CM | POA: Diagnosis not present

## 2021-01-20 DIAGNOSIS — R0602 Shortness of breath: Secondary | ICD-10-CM | POA: Diagnosis not present

## 2021-01-20 DIAGNOSIS — E7800 Pure hypercholesterolemia, unspecified: Secondary | ICD-10-CM | POA: Diagnosis not present

## 2021-02-01 ENCOUNTER — Other Ambulatory Visit: Payer: Medicare Other

## 2021-02-01 ENCOUNTER — Other Ambulatory Visit: Payer: Self-pay

## 2021-02-01 DIAGNOSIS — R972 Elevated prostate specific antigen [PSA]: Secondary | ICD-10-CM

## 2021-02-02 LAB — PSA: Prostate Specific Ag, Serum: 6.6 ng/mL — ABNORMAL HIGH (ref 0.0–4.0)

## 2021-02-08 DIAGNOSIS — Z713 Dietary counseling and surveillance: Secondary | ICD-10-CM | POA: Diagnosis not present

## 2021-02-08 DIAGNOSIS — I10 Essential (primary) hypertension: Secondary | ICD-10-CM | POA: Diagnosis not present

## 2021-02-08 DIAGNOSIS — I509 Heart failure, unspecified: Secondary | ICD-10-CM | POA: Diagnosis not present

## 2021-02-08 DIAGNOSIS — Z299 Encounter for prophylactic measures, unspecified: Secondary | ICD-10-CM | POA: Diagnosis not present

## 2021-02-13 ENCOUNTER — Ambulatory Visit: Payer: Medicare Other | Admitting: Urology

## 2021-02-15 NOTE — Progress Notes (Signed)
Sent via mail 

## 2021-02-16 DIAGNOSIS — E785 Hyperlipidemia, unspecified: Secondary | ICD-10-CM | POA: Diagnosis not present

## 2021-02-16 DIAGNOSIS — I10 Essential (primary) hypertension: Secondary | ICD-10-CM | POA: Diagnosis not present

## 2021-03-03 ENCOUNTER — Other Ambulatory Visit: Payer: Self-pay

## 2021-03-03 ENCOUNTER — Other Ambulatory Visit: Payer: Medicare Other

## 2021-03-10 ENCOUNTER — Ambulatory Visit: Payer: Medicare Other | Admitting: Urology

## 2021-03-14 NOTE — Progress Notes (Signed)
Cardiology Office Note  Date: 03/15/2021   ID: Darius Baxter, DOB Dec 18, 1933, MRN [id number]  PCP:  Monico Blitz, MD  Cardiologist:  Rozann Lesches, MD Electrophysiologist:  None   Chief Complaint  Patient presents with   Cardiac follow-up    History of Present Illness: Darius Baxter is an 87 y.o. male last seen in May by Mr. Leonides Sake NP.  He called to schedule follow-up for discussion of his leg swelling.  Reports interval visit with Dr. Manuella Ghazi at which point Aldactone was increased to 50 mg daily.  He was encouraged to follow-up with me.  States that his leg swelling is not new, this has been going on for several months.  He has had both of his knees replaced and wears braces with significant pain.  Reports "puffiness" on the tops of his feet consistent with lymphedema and he does wear compression socks.  Reports compliance with his medications.  Last echocardiogram was in 2019 at which point LVEF was 55 to 60% with mild diastolic dysfunction and mild mitral regurgitation.  He has a history of stress-induced cardiomyopathy in 2013.  I reviewed the remainder of his medications which are noted below.  He has not had any recent follow-up lab work.  Past Medical History:  Diagnosis Date   Arthritis    Cancer of kidney ([place])    Carotid artery disease ([place])    Chronic lower back pain    Coronary artery disease    Mild at cardiac catheterization 2013   Essential hypertension    History of blood transfusion 2008; 2010   Mitral valve prolapse    Mild mitral regurgitation   Mixed hyperlipidemia    Myocardial infarction ([place])    2013   Nocturia    Renal cancer ([place])    Status post right nephrectomy - 09/2006   Seizures ([place])    Childhood-87 yrs old "passed out"   Takotsubo cardiomyopathy    February 2013   Ventricular ectopy    Bigeminy    Past Surgical History:  Procedure Laterality Date   APPENDECTOMY     BIOPSY  10/19/2019   Procedure: BIOPSY;  Surgeon: Danie Binder,  MD;  [facility];  Service: Endoscopy;;   CARDIAC CATHETERIZATION     CAROTID ENDARTERECTOMY Left 02/23/2016   CAROTID ENDARTERECTOMY Right 06/11/2018   PATCH ANGIOPLASTY WITH XENOSURE PATCH   CATARACT EXTRACTION W/PHACO  03/22/2011   Procedure: CATARACT EXTRACTION PHACO AND INTRAOCULAR LENS PLACEMENT ([place]);  Surgeon: Tonny Branch;  Location: [facility];  Service: Ophthalmology;  Laterality: Right;  CDE  19.88   CATARACT EXTRACTION W/PHACO  04/16/2011   Procedure: CATARACT EXTRACTION PHACO AND INTRAOCULAR LENS PLACEMENT ([facility]);  Surgeon: Tonny Branch;  Location: [facility];  Service: Ophthalmology;  Laterality: Left;  CDE 21.13   CHOLECYSTECTOMY  01/29/2012   Procedure: LAPAROSCOPIC CHOLECYSTECTOMY WITH INTRAOPERATIVE CHOLANGIOGRAM;  Surgeon: Joyice Faster. Cornett, MD;  Location: [place];  Service: General;  Laterality: N/A;   ENDARTERECTOMY Left 02/23/2016   Procedure: LEFT CAROTID ENDARTERECTOMY WITH XENOSURE BOVINE PERICARDIUM PATCH ANGIOPLASTY;  Surgeon: Serafina Mitchell, MD;  Location: [place];  Service: Vascular;  Laterality: Left;   ENDARTERECTOMY Right 06/11/2018   Procedure: ENDARTERECTOMY CAROTID RIGHT;  Surgeon: Serafina Mitchell, MD;  [facility];  Service: Vascular;  Laterality: Right;   ESOPHAGOGASTRODUODENOSCOPY N/A 10/19/2019   one benign-appearing, intrinsic moderate stenosis s/p dilation. Moderate gastritis due to aspirin, mild duodenitis.    JOINT REPLACEMENT     KNEE  ARTHROSCOPY Right    LITHOTRIPSY     NEPHRECTOMY Right 2008   [facility] ANGIOPLASTY Right 06/11/2018   Procedure: PATCH ANGIOPLASTY WITH Weyman Pedro;  Surgeon: Serafina Mitchell, MD;  Location: [facility];  Service: Vascular;  Laterality: Right;   SAVORY DILATION N/A 10/19/2019   Procedure: SAVORY DILATION;  Surgeon: Danie Binder, MD;  [facility];  Service: Endoscopy;  Laterality: N/A;   TOTAL KNEE ARTHROPLASTY  2010   TOTAL KNEE ARTHROPLASTY Left 2014    Current Outpatient Medications  Medication Sig Dispense  Refill   acetaminophen (TYLENOL) 500 MG tablet Take 1,000 mg by mouth daily as needed for moderate pain.     amLODipine (NORVASC) 10 MG tablet Take 10 mg by mouth daily.     Artificial Tear Solution (SOOTHE XP OP) Place 1 drop into both eyes 3 (three) times daily.     aspirin EC 81 MG tablet Take 1 tablet (81 mg total) by mouth daily. 30 tablet 3   atorvastatin (LIPITOR) 40 MG tablet Take 40 mg by mouth at bedtime.      carvedilol (COREG) 6.25 MG tablet Take 3.125 mg by mouth 2 (two) times daily with a meal.     Cholecalciferol (VITAMIN D) 2000 units tablet Take 2,000 Units by mouth daily.     furosemide (LASIX) 20 MG tablet Take 20 mg by mouth daily.     hydrALAZINE (APRESOLINE) 50 MG tablet Take 50 mg by mouth 2 (two) times daily.     HYDROcodone-acetaminophen (NORCO) 7.5-325 MG tablet Take 1 tablet by mouth as needed for moderate pain. Takes one tablet 2 times a week.     olmesartan (BENICAR) 40 MG tablet Take 40 mg by mouth daily.     pantoprazole (PROTONIX) 40 MG tablet Take 40 mg by mouth every other day.     spironolactone (ALDACTONE) 50 MG tablet Take 50 mg by mouth daily.     No current facility-administered medications for this visit.   Allergies:  Nsaids and Vicodin [hydrocodone-acetaminophen]   ROS: Hearing loss.  Physical Exam: VS:  BP 126/60   Pulse 60   Ht '6\' 1"'$  (1.854 m)   Wt 222 lb 9.6 oz (101 kg)   SpO2 94%   BMI 29.37 kg/m , BMI Body mass index is 29.37 kg/m.  Wt Readings from Last 3 Encounters:  03/15/21 222 lb 9.6 oz (101 kg)  12/22/20 214 lb (97.1 kg)  08/10/20 212 lb (96.2 kg)    General: Elderly male, appears comfortable at rest. HEENT: Conjunctiva and lids normal, wearing a mask. Neck: Supple, no elevated JVP or carotid bruits, no thyromegaly. Lungs: Clear to auscultation, nonlabored breathing at rest. Cardiac: Regular rate and rhythm, no S3 or significant systolic murmur. Extremities: 1+ edema left leg and 2+ on the right with associated lymphedema.   Compression stockings in place.  ECG:  An ECG dated 12/22/2020 was personally reviewed today and demonstrated:  Sinus rhythm.  Recent Labwork:  No recent lab work for review today.  Other Studies Reviewed Today:  Echocardiogram 03/20/2018: - Left ventricle: The cavity size was normal. Wall thickness was    increased in a pattern of mild LVH. Systolic function was normal.    The estimated ejection fraction was in the range of 55% to 60%.    Wall motion was normal; there were no regional wall motion    abnormalities. Doppler parameters are consistent with abnormal    left ventricular relaxation (grade 1 diastolic dysfunction).  -  Aortic valve: Mildly calcified annulus. Trileaflet. There was    trivial regurgitation.  - Mitral valve: Mildly calcified annulus. There was mild    regurgitation.  - Right atrium: Central venous pressure (est): 3 mm Hg.  - Atrial septum: No defect or patent foramen ovale was identified.  - Tricuspid valve: There was trivial regurgitation.  - Pulmonary arteries: PA peak pressure: 18 mm Hg (S).  - Pericardium, extracardiac: There was no pericardial effusion.   Carotid Dopplers 07/27/2020: Summary:  Right Carotid: Velocities in the right ICA are consistent with a 1-39%  stenosis.   Left Carotid: Velocities in the left ICA are consistent with a 1-39%  stenosis.   Vertebrals:  Bilateral vertebral arteries demonstrate antegrade flow.  Subclavians: Normal flow hemodynamics were seen in bilateral subclavian               arteries.   Assessment and Plan:  1.  Bilateral leg edema and lymphedema.  Likely multifactorial.  He does have a previous history of stress-induced cardiomyopathy in 2013, last assessment of LVEF was normal in 2019.  Follow-up echocardiogram will be obtained.  Dr. Manuella Ghazi increased Aldactone to 50 mg daily and he does report some improvement in swelling.  Also on Lasix 20 mg daily.  Check BMET and then make further recommendations.  2.  Essential  hypertension, blood pressure is well controlled today.  He is on Benicar, Coreg, and Norvasc (which could also be contributing to some of his leg swelling).  3.  Mixed hyperlipidemia, on Lipitor.  Medication Adjustments/Labs and Tests Ordered: Current medicines are reviewed at length with the patient today.  Concerns regarding medicines are outlined above.   Tests Ordered: Orders Placed This Encounter  Procedures   Basic metabolic panel   ECHOCARDIOGRAM COMPLETE     Medication Changes: No orders of the defined types were placed in this encounter.   Disposition:  Follow up  test results.  Signed, Satira Sark, MD, [facility] 03/15/2021 8:41 AM    [facility] [place], [place], [place] [address] Phone: [phone]; Fax: [phone]

## 2021-03-15 ENCOUNTER — Other Ambulatory Visit: Payer: Self-pay

## 2021-03-15 ENCOUNTER — Encounter: Payer: Self-pay | Admitting: Cardiology

## 2021-03-15 ENCOUNTER — Ambulatory Visit: Payer: Medicare Other | Admitting: Cardiology

## 2021-03-15 VITALS — BP 126/60 | HR 60 | Ht 73.0 in | Wt 222.6 lb

## 2021-03-15 DIAGNOSIS — M7989 Other specified soft tissue disorders: Secondary | ICD-10-CM

## 2021-03-15 DIAGNOSIS — Z8679 Personal history of other diseases of the circulatory system: Secondary | ICD-10-CM

## 2021-03-15 DIAGNOSIS — E782 Mixed hyperlipidemia: Secondary | ICD-10-CM | POA: Diagnosis not present

## 2021-03-15 DIAGNOSIS — I10 Essential (primary) hypertension: Secondary | ICD-10-CM | POA: Diagnosis not present

## 2021-03-15 NOTE — Patient Instructions (Addendum)
Medication Instructions:  Your physician recommends that you continue on your current medications as directed. Please refer to the Current Medication list given to you today.  Labwork: BMET at [place] day as Echocardiogram  Testing/Procedures: Your physician has requested that you have an echocardiogram. Echocardiography is a painless test that uses sound waves to create images of your heart. It provides your doctor with information about the size and shape of your heart and how well your heart's chambers and valves are working. This procedure takes approximately one hour. There are no restrictions for this procedure.  Follow-Up: Your physician recommends that you schedule a follow-up appointment in: pending  Any Other Special Instructions Will Be Listed Below (If Applicable).  If you need a refill on your cardiac medications before your next appointment, please call your pharmacy.

## 2021-03-19 DIAGNOSIS — E785 Hyperlipidemia, unspecified: Secondary | ICD-10-CM | POA: Diagnosis not present

## 2021-03-19 DIAGNOSIS — I10 Essential (primary) hypertension: Secondary | ICD-10-CM | POA: Diagnosis not present

## 2021-03-21 ENCOUNTER — Telehealth: Payer: Medicare Other | Admitting: Urology

## 2021-03-30 ENCOUNTER — Other Ambulatory Visit (HOSPITAL_COMMUNITY)
Admission: RE | Admit: 2021-03-30 | Discharge: 2021-03-30 | Disposition: A | Discharge status: Home / Self Care | Payer: Medicare Other | Source: Ambulatory Visit | Attending: Cardiology | Admitting: Cardiology

## 2021-03-30 ENCOUNTER — Ambulatory Visit (HOSPITAL_COMMUNITY)
Admission: RE | Admit: 2021-03-30 | Discharge: 2021-03-30 | Disposition: A | Discharge status: Home / Self Care | Payer: Medicare Other | Source: Ambulatory Visit | Attending: Cardiology | Admitting: Cardiology

## 2021-03-30 ENCOUNTER — Other Ambulatory Visit: Payer: Self-pay

## 2021-03-30 DIAGNOSIS — I341 Nonrheumatic mitral (valve) prolapse: Secondary | ICD-10-CM

## 2021-03-30 DIAGNOSIS — I2510 Atherosclerotic heart disease of native coronary artery without angina pectoris: Secondary | ICD-10-CM | POA: Diagnosis not present

## 2021-03-30 DIAGNOSIS — M7989 Other specified soft tissue disorders: Secondary | ICD-10-CM

## 2021-03-30 DIAGNOSIS — Z8679 Personal history of other diseases of the circulatory system: Secondary | ICD-10-CM | POA: Insufficient documentation

## 2021-03-30 DIAGNOSIS — I252 Old myocardial infarction: Secondary | ICD-10-CM | POA: Insufficient documentation

## 2021-03-30 DIAGNOSIS — I10 Essential (primary) hypertension: Secondary | ICD-10-CM | POA: Diagnosis not present

## 2021-03-30 DIAGNOSIS — E785 Hyperlipidemia, unspecified: Secondary | ICD-10-CM | POA: Diagnosis not present

## 2021-03-30 DIAGNOSIS — R600 Localized edema: Secondary | ICD-10-CM

## 2021-03-30 DIAGNOSIS — Z85528 Personal history of other malignant neoplasm of kidney: Secondary | ICD-10-CM | POA: Insufficient documentation

## 2021-03-30 DIAGNOSIS — I361 Nonrheumatic tricuspid (valve) insufficiency: Secondary | ICD-10-CM

## 2021-03-30 LAB — BASIC METABOLIC PANEL
Anion gap: 3 — ABNORMAL LOW (ref 5–15)
BUN: 37 mg/dL — ABNORMAL HIGH (ref 8–23)
CO2: 22 mmol/L (ref 22–32)
Calcium: 9.8 mg/dL (ref 8.9–10.3)
Chloride: 110 mmol/L (ref 98–111)
Creatinine, Ser: 1.59 mg/dL — ABNORMAL HIGH (ref 0.61–1.24)
GFR, Estimated: 42 mL/min — ABNORMAL LOW (ref >60)
Glucose, Bld: 102 mg/dL — ABNORMAL HIGH (ref 70–99)
Potassium: 5.7 mmol/L — ABNORMAL HIGH (ref 3.5–5.1)
Sodium: 135 mmol/L (ref 135–145)

## 2021-03-30 LAB — ECHOCARDIOGRAM COMPLETE
Area-P 1/2: 2.09 cm2
S' Lateral: 2.7 cm

## 2021-03-30 NOTE — Progress Notes (Signed)
*  PRELIMINARY RESULTS* Echocardiogram 2D Echocardiogram has been performed.  Darius Baxter 03/30/2021, 11:41 AM

## 2021-03-31 ENCOUNTER — Telehealth: Payer: Self-pay | Admitting: *Deleted

## 2021-03-31 ENCOUNTER — Other Ambulatory Visit: Payer: Self-pay | Admitting: *Deleted

## 2021-03-31 DIAGNOSIS — Z79899 Other long term (current) drug therapy: Secondary | ICD-10-CM

## 2021-03-31 DIAGNOSIS — I10 Essential (primary) hypertension: Secondary | ICD-10-CM

## 2021-03-31 MED ORDER — SPIRONOLACTONE 50 MG PO TABS: 25.0000 mg | ORAL_TABLET | Freq: Every day | ORAL | 1 refills | Status: DC

## 2021-03-31 NOTE — Telephone Encounter (Signed)
Patient informed and verbalized understanding of plan. Lab order faxed to [facility] 

## 2021-03-31 NOTE — Telephone Encounter (Signed)
-----   Message from Erma Heritage, [place] sent at 03/30/2021  1:06 PM EDT ----- Covering for Dr. Domenic Polite - Please let the patient know his repeat labs show he is dehydrated and his K+ is significantly elevated.  I cannot see where he is on potassium supplementation but by review of notes, Spironolactone was recently increased by his PCP and this can cause elevated potassium. I would recommend that he hold Spironolactone tomorrow and then restart the following day but at a lower dose of 25 mg daily. Repeat BMET in 5-7 days for reassessment.  If he has worsening swelling issues with this, we may need to further titrate Lasix or reduce his dose of Amlodipine which can cause lower extremity edema as well.

## 2021-04-03 ENCOUNTER — Telehealth: Payer: Self-pay | Admitting: *Deleted

## 2021-04-03 NOTE — Telephone Encounter (Signed)
-----   Message from Darius Baxter, [place] sent at 03/31/2021 11:41 AM EDT ----- Covering for Dr. Domenic Polite - Please that the patient was echocardiogram showed normal pumping function of the heart with a preserved ejection fraction of 65 to 70%.  No regional wall motion abnormalities. He does have mild mitral valve prolapse but no significant valve abnormalities. Overall, a reassuring study.

## 2021-04-03 NOTE — Telephone Encounter (Signed)
Patient informed. Copy sent to PCP °

## 2021-04-06 DIAGNOSIS — I10 Essential (primary) hypertension: Secondary | ICD-10-CM | POA: Diagnosis not present

## 2021-04-11 ENCOUNTER — Telehealth: Payer: Self-pay | Admitting: Cardiology

## 2021-04-11 DIAGNOSIS — Z79899 Other long term (current) drug therapy: Secondary | ICD-10-CM

## 2021-04-11 DIAGNOSIS — I10 Essential (primary) hypertension: Secondary | ICD-10-CM

## 2021-04-11 DIAGNOSIS — M7989 Other specified soft tissue disorders: Secondary | ICD-10-CM

## 2021-04-11 MED ORDER — SPIRONOLACTONE 25 MG PO TABS: 12.5000 mg | ORAL_TABLET | Freq: Every day | ORAL | 1 refills | Status: DC

## 2021-04-11 NOTE — Telephone Encounter (Signed)
-----   Message from Erma Heritage, [place] sent at 04/06/2021  4:12 PM EDT ----- Follow-up labs from when I was covering Dr. Myles Gip inbox - Please let the patient know his kidney function has improved with creatinine going from 1.5 down to 1.32. His potassium still remains elevated at 5.6.  Would confirm that he is not taking potassium supplementation. If not on potassium supplementation, would further reduce Spironolactone to 12.5 mg daily as this medication can cause elevated K+.  Recheck BMET again in 2 weeks for follow-up.

## 2021-04-11 NOTE — Telephone Encounter (Signed)
Calling for lab results. °

## 2021-04-11 NOTE — Telephone Encounter (Signed)
Confirmed that he is not taking any potassium supplements Patient informed and verbalized understanding of plan. Lab order faxed to [facility]

## 2021-04-25 ENCOUNTER — Encounter: Payer: Self-pay | Admitting: Urology

## 2021-04-25 ENCOUNTER — Other Ambulatory Visit: Payer: Self-pay

## 2021-04-25 ENCOUNTER — Telehealth (INDEPENDENT_AMBULATORY_CARE_PROVIDER_SITE_OTHER): Payer: Medicare Other | Admitting: Urology

## 2021-04-25 DIAGNOSIS — R351 Nocturia: Secondary | ICD-10-CM

## 2021-04-25 DIAGNOSIS — R972 Elevated prostate specific antigen [PSA]: Secondary | ICD-10-CM

## 2021-04-25 MED ORDER — ALFUZOSIN HCL ER 10 MG PO TB24: 10.0000 mg | ORAL_TABLET | Freq: Every evening | ORAL | 11 refills | Status: DC

## 2021-04-25 NOTE — Progress Notes (Signed)
04/25/2021 2:39 PM   Darius Baxter Jan 04, 1934 [id number]  Referring provider: Ralph Baxter, [address] [place],  [place] [address]  Patient location: home Physician location: office I connected with  Darius Baxter on 04/25/21 by a video enabled telemedicine application and verified that I am speaking with the correct person using two identifiers.   I discussed the limitations of evaluation and management by telemedicine. The patient expressed understanding and agreed to proceed.    Followup elevated PSA and nocturia  HPI: Darius Baxter is a 87yo here for followup for elevated PSA and nocturia. PSA decreased to 6.6 from 7.2. He has stable nocturia 3x on no BPH therapy. His urine stream is fair. He has urinary hesitancy, urinary frequency q 2-3 hours, and urinary urgency. No other complaints today   PMH: Past Medical History:  Diagnosis Date   Arthritis    Cancer of kidney ([place])    Carotid artery disease ([facility])    Chronic lower back pain    Coronary artery disease    Mild at cardiac catheterization 2013   Essential hypertension    History of blood transfusion 2008; 2010   Mitral valve prolapse    Mild mitral regurgitation   Mixed hyperlipidemia    Myocardial infarction ([place])    2013   Nocturia    Renal cancer ([place])    Status post right nephrectomy - 09/2006   Seizures ([place])    Childhood-87 yrs old "passed out"   Takotsubo cardiomyopathy    February 2013   Ventricular ectopy    Bigeminy    Surgical History: Past Surgical History:  Procedure Laterality Date   APPENDECTOMY     BIOPSY  10/19/2019   Procedure: BIOPSY;  Surgeon: Danie Binder, MD;  [facility];  Service: Endoscopy;;   CARDIAC CATHETERIZATION     CAROTID ENDARTERECTOMY Left 02/23/2016   CAROTID ENDARTERECTOMY Right 06/11/2018   PATCH ANGIOPLASTY WITH XENOSURE PATCH   CATARACT EXTRACTION W/PHACO  03/22/2011   Procedure: CATARACT EXTRACTION PHACO AND INTRAOCULAR LENS PLACEMENT ([place]);   Surgeon: Tonny Branch;  Location: [facility];  Service: Ophthalmology;  Laterality: Right;  CDE  19.88   CATARACT EXTRACTION W/PHACO  04/16/2011   Procedure: CATARACT EXTRACTION PHACO AND INTRAOCULAR LENS PLACEMENT ([facility]);  Surgeon: Tonny Branch;  Location: [facility];  Service: Ophthalmology;  Laterality: Left;  CDE 21.13   CHOLECYSTECTOMY  01/29/2012   Procedure: LAPAROSCOPIC CHOLECYSTECTOMY WITH INTRAOPERATIVE CHOLANGIOGRAM;  Surgeon: Joyice Faster. Cornett, MD;  Location: [place];  Service: General;  Laterality: N/A;   ENDARTERECTOMY Left 02/23/2016   Procedure: LEFT CAROTID ENDARTERECTOMY WITH XENOSURE BOVINE PERICARDIUM PATCH ANGIOPLASTY;  Surgeon: Serafina Mitchell, MD;  Location: [place];  Service: Vascular;  Laterality: Left;   ENDARTERECTOMY Right 06/11/2018   Procedure: ENDARTERECTOMY CAROTID RIGHT;  Surgeon: Serafina Mitchell, MD;  [facility];  Service: Vascular;  Laterality: Right;   ESOPHAGOGASTRODUODENOSCOPY N/A 10/19/2019   one benign-appearing, intrinsic moderate stenosis s/p dilation. Moderate gastritis due to aspirin, mild duodenitis.    JOINT REPLACEMENT     KNEE ARTHROSCOPY Right    LITHOTRIPSY     NEPHRECTOMY Right 2008   [facility] ANGIOPLASTY Right 06/11/2018   Procedure: PATCH ANGIOPLASTY WITH Weyman Pedro;  Surgeon: Serafina Mitchell, MD;  [facility];  Service: Vascular;  Laterality: Right;   SAVORY DILATION N/A 10/19/2019   Procedure: SAVORY DILATION;  Surgeon: Danie Binder, MD;  [facility];  Service: Endoscopy;  Laterality: N/A;  TOTAL KNEE ARTHROPLASTY  2010   TOTAL KNEE ARTHROPLASTY Left 2014    Home Medications:  Allergies as of 04/25/2021       Reactions   Nsaids    Patient has only 1 kidney and Doctor  Doesn't want him to take   Vicodin [hydrocodone-acetaminophen] Nausea And Vomiting   IV only; no problems taking oral tablet        Medication List        Accurate as of April 25, 2021  2:39 PM. If you have any questions, ask your nurse or doctor.           acetaminophen 500 MG tablet Commonly known as: TYLENOL Take 1,000 mg by mouth daily as needed for moderate pain.   amLODipine 10 MG tablet Commonly known as: NORVASC Take 10 mg by mouth daily.   aspirin EC 81 MG tablet Take 1 tablet (81 mg total) by mouth daily.   atorvastatin 40 MG tablet Commonly known as: LIPITOR Take 40 mg by mouth at bedtime.   carvedilol 6.25 MG tablet Commonly known as: COREG Take 3.125 mg by mouth 2 (two) times daily with a meal.   furosemide 20 MG tablet Commonly known as: LASIX Take 20 mg by mouth daily.   hydrALAZINE 50 MG tablet Commonly known as: APRESOLINE Take 50 mg by mouth 2 (two) times daily.   HYDROcodone-acetaminophen 7.5-325 MG tablet Commonly known as: NORCO Take 1 tablet by mouth as needed for moderate pain. Takes one tablet 2 times a week.   olmesartan 40 MG tablet Commonly known as: BENICAR Take 40 mg by mouth daily.   pantoprazole 40 MG tablet Commonly known as: PROTONIX Take 40 mg by mouth every other day.   SOOTHE XP OP Place 1 drop into both eyes 3 (three) times daily.   spironolactone 25 MG tablet Commonly known as: ALDACTONE Take 0.5 tablets (12.5 mg total) by mouth daily.   Vitamin D 50 MCG (2000 UT) tablet Take 2,000 Units by mouth daily.        Allergies:  Allergies  Allergen Reactions   Nsaids     Patient has only 1 kidney and Doctor  Doesn't want him to take   Vicodin [Hydrocodone-Acetaminophen] Nausea And Vomiting    IV only; no problems taking oral tablet    Family History: Family History  Problem Relation Age of Onset   Cancer Other    Stroke Other    Coronary artery disease Other    Hypertension Other    Hypertension Mother    Peripheral Artery Disease Mother        had leg bypass, and died after surgery; believed to be from throwing a clot   CVA Father    Prostate cancer Father    Anesthesia problems Neg Hx    Hypotension Neg Hx    Malignant hyperthermia Neg Hx    Pseudochol  deficiency Neg Hx     Social History:  reports that he has never smoked. He has never used smokeless tobacco. He reports that he does not drink alcohol and does not use drugs.  ROS: All other review of systems were reviewed and are negative except what is noted above in HPI   Laboratory Data: Lab Results  Component Value Date   WBC 7.0 05/23/2019   HGB 15.2 05/23/2019   HCT 47.6 05/23/2019   MCV 97.3 05/23/2019   PLT 155 05/23/2019    Lab Results  Component Value Date   CREATININE 1.59 (H) 03/30/2021  No results found for: PSA  No results found for: TESTOSTERONE  No results found for: HGBA1C  Urinalysis    Component Value Date/Time   COLORURINE YELLOW 06/05/2018 1102   APPEARANCEUR Clear 08/10/2020 1503   LABSPEC 1.009 06/05/2018 1102   PHURINE 5.0 06/05/2018 1102   GLUCOSEU Negative 08/10/2020 1503   HGBUR SMALL (A) 06/05/2018 1102   BILIRUBINUR Negative 08/10/2020 1503   KETONESUR NEGATIVE 06/05/2018 1102   PROTEINUR Negative 08/10/2020 1503   PROTEINUR NEGATIVE 06/05/2018 1102   NITRITE Negative 08/10/2020 1503   NITRITE NEGATIVE 06/05/2018 1102   LEUKOCYTESUR Negative 08/10/2020 1503    Lab Results  Component Value Date   LABMICR Comment 08/10/2020   BACTERIA RARE (A) 06/05/2018    Pertinent Imaging:  No results found for this or any previous visit.  No results found for this or any previous visit.  No results found for this or any previous visit.  No results found for this or any previous visit.  No results found for this or any previous visit.  No results found for this or any previous visit.  No results found for this or any previous visit.  No results found for this or any previous visit.   Assessment & Plan:    1. Elevated PSA -RTC 6 months with PSA  2. Nocturia -We will start uroxatral '10mg'$  qhs   No follow-ups on file.  Nicolette Bang, MD  [facility] [place]

## 2021-04-25 NOTE — Patient Instructions (Signed)
Benign Prostatic Hyperplasia Benign prostatic hyperplasia (BPH) is an enlarged prostate gland that is caused by the normal aging process and not by cancer. The prostate is a walnut-sized gland that is involved in the production of semen. It is located in front of the rectum and below the bladder. The bladder stores urine and the urethra is the tube that carries the urine out of the body. The prostate may get bigger as a man gets older. An enlarged prostate can press on the urethra. This can make it harder to pass urine. The build-up of urine in the bladder can cause infection. Back pressure and infection may progress to bladder damage and kidney (renal) failure. What are the causes? This condition is part of a normal aging process. However, not all men develop problems from this condition. If the prostate enlarges away from the urethra, urine flow will not be blocked. If it enlarges toward the urethra and compresses it, there will be problems passing urine. What increases the risk? This condition is more likely to develop in men over the age of 50 years. What are the signs or symptoms? Symptoms of this condition include: Getting up often during the night to urinate. Needing to urinate frequently during the day. Difficulty starting urine flow. Decrease in size and strength of your urine stream. Leaking (dribbling) after urinating. Inability to pass urine. This needs immediate treatment. Inability to completely empty your bladder. Pain when you pass urine. This is more common if there is also an infection. Urinary tract infection (UTI). How is this diagnosed? This condition is diagnosed based on your medical history, a physical exam, and your symptoms. Tests will also be done, such as: A post-void bladder scan. This measures any amount of urine that may remain in your bladder after you finish urinating. A digital rectal exam. In a rectal exam, your health care provider checks your prostate by  putting a lubricated, gloved finger into your rectum to feel the back of your prostate gland. This exam detects the size of your gland and any abnormal lumps or growths. An exam of your urine (urinalysis). A prostate specific antigen (PSA) screening. This is a blood test used to screen for prostate cancer. An ultrasound. This test uses sound waves to electronically produce a picture of your prostate gland. Your health care provider may refer you to a specialist in kidney and prostate diseases (urologist). How is this treated? Once symptoms begin, your health care provider will monitor your condition (active surveillance or watchful waiting). Treatment for this condition will depend on the severity of your condition. Treatment may include: Observation and yearly exams. This may be the only treatment needed if your condition and symptoms are mild. Medicines to relieve your symptoms, including: Medicines to shrink the prostate. Medicines to relax the muscle of the prostate. Surgery in severe cases. Surgery may include: Prostatectomy. In this procedure, the prostate tissue is removed completely through an open incision or with a laparoscope or robotics. Transurethral resection of the prostate (TURP). In this procedure, a tool is inserted through the opening at the tip of the penis (urethra). It is used to cut away tissue of the inner core of the prostate. The pieces are removed through the same opening of the penis. This removes the blockage. Transurethral incision (TUIP). In this procedure, small cuts are made in the prostate. This lessens the prostate's pressure on the urethra. Transurethral microwave thermotherapy (TUMT). This procedure uses microwaves to create heat. The heat destroys and removes a   small amount of prostate tissue. Transurethral needle ablation (TUNA). This procedure uses radio frequencies to destroy and remove a small amount of prostate tissue. Interstitial laser coagulation (ILC).  This procedure uses a laser to destroy and remove a small amount of prostate tissue. Transurethral electrovaporization (TUVP). This procedure uses electrodes to destroy and remove a small amount of prostate tissue. Prostatic urethral lift. This procedure inserts an implant to push the lobes of the prostate away from the urethra. Follow these instructions at home: Take over-the-counter and prescription medicines only as told by your health care provider. Monitor your symptoms for any changes. Contact your health care provider with any changes. Avoid drinking large amounts of liquid before going to bed or out in public. Avoid or reduce how much caffeine or alcohol you drink. Give yourself time when you urinate. Keep all follow-up visits as told by your health care provider. This is important. Contact a health care provider if: You have unexplained back pain. Your symptoms do not get better with treatment. You develop side effects from the medicine you are taking. Your urine becomes very dark or has a bad smell. Your lower abdomen becomes distended and you have trouble passing your urine. Get help right away if: You have a fever or chills. You suddenly cannot urinate. You feel lightheaded, or very dizzy, or you faint. There are large amounts of blood or clots in the urine. Your urinary problems become hard to manage. You develop moderate to severe low back or flank pain. The flank is the side of your body between the ribs and the hip. These symptoms may represent a serious problem that is an emergency. Do not wait to see if the symptoms will go away. Get medical help right away. Call your local emergency services (911 in the [place] Do not drive yourself to the hospital. Summary Benign prostatic hyperplasia (BPH) is an enlarged prostate that is caused by the normal aging process and not by cancer. An enlarged prostate can press on the urethra. This can make it hard to pass urine. This  condition is part of a normal aging process and is more likely to develop in men over the age of 50 years. Get help right away if you suddenly cannot urinate. This information is not intended to replace advice given to you by your health care provider. Make sure you discuss any questions you have with your health care provider. Document Revised: 11/16/2020 Document Reviewed: 04/14/2020 Elsevier Patient Education  2022 [employer]  

## 2021-05-02 DIAGNOSIS — I10 Essential (primary) hypertension: Secondary | ICD-10-CM | POA: Diagnosis not present

## 2021-05-03 DIAGNOSIS — Z299 Encounter for prophylactic measures, unspecified: Secondary | ICD-10-CM | POA: Diagnosis not present

## 2021-05-03 DIAGNOSIS — I10 Essential (primary) hypertension: Secondary | ICD-10-CM | POA: Diagnosis not present

## 2021-05-03 DIAGNOSIS — M48061 Spinal stenosis, lumbar region without neurogenic claudication: Secondary | ICD-10-CM | POA: Diagnosis not present

## 2021-05-03 DIAGNOSIS — I509 Heart failure, unspecified: Secondary | ICD-10-CM | POA: Diagnosis not present

## 2021-05-03 DIAGNOSIS — Z23 Encounter for immunization: Secondary | ICD-10-CM | POA: Diagnosis not present

## 2021-05-04 ENCOUNTER — Telehealth: Payer: Self-pay | Admitting: Student

## 2021-05-04 NOTE — Telephone Encounter (Signed)
Patient informed and verbalized understanding of plan. 

## 2021-05-04 NOTE — Telephone Encounter (Signed)
    I received follow-up labs from [facility] on this patient from when I was previously covering Dr. Myles Gip inbox. His kidney function is overall stable but his potassium still remains elevated despite dose reduction of Spironolactone. It was previously confirmed that he is not taking potassium supplementation and given that he is already on the lowest dose of Spironolactone at 12.5 mg daily, I would recommend discontinuing the medication at this time. Recheck again in 3-4 weeks by [place] or his PCP.   Signed, Erma Heritage, PA-C 05/04/2021, 4:27 PM

## 2021-06-02 ENCOUNTER — Telehealth: Payer: Self-pay

## 2021-06-02 NOTE — Telephone Encounter (Signed)
Patient called following up with how meds (alfuzosin (UROXATRAL) 10 MG 24 hr tablet) were working. He advised since medication was prescribed he has had several nights where he is unable to control his bladder and frequency urinating and he is not waking up during those nights. He advised within the last 40 days its happened roughly 5-6 times.

## 2021-06-06 DIAGNOSIS — I429 Cardiomyopathy, unspecified: Secondary | ICD-10-CM | POA: Diagnosis not present

## 2021-06-06 DIAGNOSIS — H9202 Otalgia, left ear: Secondary | ICD-10-CM | POA: Diagnosis not present

## 2021-06-06 DIAGNOSIS — I509 Heart failure, unspecified: Secondary | ICD-10-CM | POA: Diagnosis not present

## 2021-06-06 DIAGNOSIS — Z299 Encounter for prophylactic measures, unspecified: Secondary | ICD-10-CM | POA: Diagnosis not present

## 2021-06-06 NOTE — Telephone Encounter (Signed)
See below

## 2021-06-13 MED ORDER — TAMSULOSIN HCL 0.4 MG PO CAPS: 0.4000 mg | ORAL_CAPSULE | Freq: Every evening | ORAL | 11 refills | Status: DC

## 2021-06-13 NOTE — Telephone Encounter (Signed)
Rx sent. Patient called and made aware. 

## 2021-06-19 DIAGNOSIS — E785 Hyperlipidemia, unspecified: Secondary | ICD-10-CM | POA: Diagnosis not present

## 2021-06-19 DIAGNOSIS — I10 Essential (primary) hypertension: Secondary | ICD-10-CM | POA: Diagnosis not present

## 2021-06-27 DIAGNOSIS — I509 Heart failure, unspecified: Secondary | ICD-10-CM | POA: Diagnosis not present

## 2021-06-27 DIAGNOSIS — Z299 Encounter for prophylactic measures, unspecified: Secondary | ICD-10-CM | POA: Diagnosis not present

## 2021-06-27 DIAGNOSIS — H6691 Otitis media, unspecified, right ear: Secondary | ICD-10-CM | POA: Diagnosis not present

## 2021-06-27 DIAGNOSIS — I10 Essential (primary) hypertension: Secondary | ICD-10-CM | POA: Diagnosis not present

## 2021-06-27 DIAGNOSIS — E261 Secondary hyperaldosteronism: Secondary | ICD-10-CM | POA: Diagnosis not present

## 2021-07-04 ENCOUNTER — Other Ambulatory Visit: Payer: Self-pay

## 2021-07-04 DIAGNOSIS — I6523 Occlusion and stenosis of bilateral carotid arteries: Secondary | ICD-10-CM

## 2021-07-04 DIAGNOSIS — J309 Allergic rhinitis, unspecified: Secondary | ICD-10-CM | POA: Diagnosis not present

## 2021-07-04 DIAGNOSIS — Z789 Other specified health status: Secondary | ICD-10-CM | POA: Diagnosis not present

## 2021-07-04 DIAGNOSIS — M48061 Spinal stenosis, lumbar region without neurogenic claudication: Secondary | ICD-10-CM | POA: Diagnosis not present

## 2021-07-04 DIAGNOSIS — I509 Heart failure, unspecified: Secondary | ICD-10-CM | POA: Diagnosis not present

## 2021-07-04 DIAGNOSIS — I10 Essential (primary) hypertension: Secondary | ICD-10-CM | POA: Diagnosis not present

## 2021-07-04 DIAGNOSIS — Z299 Encounter for prophylactic measures, unspecified: Secondary | ICD-10-CM | POA: Diagnosis not present

## 2021-07-14 DIAGNOSIS — R6889 Other general symptoms and signs: Secondary | ICD-10-CM | POA: Diagnosis not present

## 2021-07-14 DIAGNOSIS — N200 Calculus of kidney: Secondary | ICD-10-CM | POA: Diagnosis not present

## 2021-07-14 DIAGNOSIS — R404 Transient alteration of awareness: Secondary | ICD-10-CM | POA: Diagnosis not present

## 2021-07-14 DIAGNOSIS — R1084 Generalized abdominal pain: Secondary | ICD-10-CM | POA: Diagnosis not present

## 2021-07-14 DIAGNOSIS — R1031 Right lower quadrant pain: Secondary | ICD-10-CM | POA: Diagnosis not present

## 2021-07-14 DIAGNOSIS — N10 Acute tubulo-interstitial nephritis: Secondary | ICD-10-CM | POA: Diagnosis not present

## 2021-07-14 DIAGNOSIS — R109 Unspecified abdominal pain: Secondary | ICD-10-CM | POA: Diagnosis not present

## 2021-07-14 DIAGNOSIS — I700 Atherosclerosis of aorta: Secondary | ICD-10-CM | POA: Diagnosis not present

## 2021-07-14 DIAGNOSIS — K5730 Diverticulosis of large intestine without perforation or abscess without bleeding: Secondary | ICD-10-CM | POA: Diagnosis not present

## 2021-07-14 DIAGNOSIS — N12 Tubulo-interstitial nephritis, not specified as acute or chronic: Secondary | ICD-10-CM | POA: Diagnosis not present

## 2021-07-14 DIAGNOSIS — Z743 Need for continuous supervision: Secondary | ICD-10-CM | POA: Diagnosis not present

## 2021-07-14 DIAGNOSIS — K4090 Unilateral inguinal hernia, without obstruction or gangrene, not specified as recurrent: Secondary | ICD-10-CM | POA: Diagnosis not present

## 2021-07-15 DIAGNOSIS — K4090 Unilateral inguinal hernia, without obstruction or gangrene, not specified as recurrent: Secondary | ICD-10-CM | POA: Diagnosis not present

## 2021-07-15 DIAGNOSIS — K5730 Diverticulosis of large intestine without perforation or abscess without bleeding: Secondary | ICD-10-CM | POA: Diagnosis not present

## 2021-07-15 DIAGNOSIS — N200 Calculus of kidney: Secondary | ICD-10-CM | POA: Diagnosis not present

## 2021-07-20 DIAGNOSIS — Z299 Encounter for prophylactic measures, unspecified: Secondary | ICD-10-CM | POA: Diagnosis not present

## 2021-07-20 DIAGNOSIS — N390 Urinary tract infection, site not specified: Secondary | ICD-10-CM | POA: Diagnosis not present

## 2021-07-20 DIAGNOSIS — E7800 Pure hypercholesterolemia, unspecified: Secondary | ICD-10-CM | POA: Diagnosis not present

## 2021-07-20 DIAGNOSIS — N10 Acute tubulo-interstitial nephritis: Secondary | ICD-10-CM | POA: Diagnosis not present

## 2021-07-20 DIAGNOSIS — I509 Heart failure, unspecified: Secondary | ICD-10-CM | POA: Diagnosis not present

## 2021-07-28 ENCOUNTER — Ambulatory Visit: Payer: Medicare Other | Admitting: Physician Assistant

## 2021-07-28 ENCOUNTER — Ambulatory Visit (HOSPITAL_COMMUNITY)
Admission: RE | Admit: 2021-07-28 | Discharge: 2021-07-28 | Disposition: A | Discharge status: Home / Self Care | Payer: Medicare Other | Source: Ambulatory Visit | Attending: Physician Assistant | Admitting: Physician Assistant

## 2021-07-28 ENCOUNTER — Other Ambulatory Visit: Payer: Self-pay

## 2021-07-28 VITALS — BP 148/75 | HR 69 | Temp 98.0°F | Resp 20 | Ht 73.0 in | Wt 218.6 lb

## 2021-07-28 DIAGNOSIS — I6523 Occlusion and stenosis of bilateral carotid arteries: Secondary | ICD-10-CM

## 2021-07-28 NOTE — Progress Notes (Signed)
Office Note     CC:  follow up Requesting Provider:  Monico Blitz, MD  HPI: Darius Baxter is a 87 y.o. ([birth year]) male who presents who presents for follow up of carotid artery disease. He has history of bilateral carotid endarterectomy with bovine pericardial patch. His left was performed in July of 2017 for symptomatic stenosis and the right was performed in October of 2019. This was for asymptomatic stenosis. At time of his last visit in December of 2021 he remained without any new neurological symptoms  Today he reports no visual changes, slurred speech, facial drooping, upper or lower extremity weakness or numbness. He does get dizziness at times. He explains that it comes all the sudden usually at rest while just sitting down and last several minutes and then goes away. At time he says he feels like it effects his vision.    He denies any claudication symptoms, rest pain or non healing wounds. He is having a lot of back pain secondary to arthritis. Explains that he spends a lot of time sitting or lying down with a heating pad. He also continues to have bilateral knee pain which limits his ambulation. He wears braces on both knees   The pt is on a statin for cholesterol management.  The pt is on a daily aspirin.   Other AC: none The pt is on CCB, Hydralazine and BB for hypertension.   The pt is not diabetic.  Tobacco hx: never  Past Medical History:  Diagnosis Date   Arthritis    Cancer of kidney ([place])    Carotid artery disease ([facility])    Chronic lower back pain    Coronary artery disease    Mild at cardiac catheterization 2013   Essential hypertension    History of blood transfusion 2008; 2010   Mitral valve prolapse    Mild mitral regurgitation   Mixed hyperlipidemia    Myocardial infarction ([place])    2013   Nocturia    Renal cancer ([place])    Status post right nephrectomy - 09/2006   Seizures ([place])    Childhood-87 yrs old "passed out"   Takotsubo cardiomyopathy     February 2013   Ventricular ectopy    Bigeminy    Past Surgical History:  Procedure Laterality Date   APPENDECTOMY     BIOPSY  10/19/2019   Procedure: BIOPSY;  Surgeon: Danie Binder, MD;  [facility];  Service: Endoscopy;;   CARDIAC CATHETERIZATION     CAROTID ENDARTERECTOMY Left 02/23/2016   CAROTID ENDARTERECTOMY Right 06/11/2018   PATCH ANGIOPLASTY WITH XENOSURE PATCH   CATARACT EXTRACTION W/PHACO  03/22/2011   Procedure: CATARACT EXTRACTION PHACO AND INTRAOCULAR LENS PLACEMENT ([place]);  Surgeon: Tonny Branch;  Location: [facility];  Service: Ophthalmology;  Laterality: Right;  CDE  19.88   CATARACT EXTRACTION W/PHACO  04/16/2011   Procedure: CATARACT EXTRACTION PHACO AND INTRAOCULAR LENS PLACEMENT ([facility]);  Surgeon: Tonny Branch;  Location: [facility];  Service: Ophthalmology;  Laterality: Left;  CDE 21.13   CHOLECYSTECTOMY  01/29/2012   Procedure: LAPAROSCOPIC CHOLECYSTECTOMY WITH INTRAOPERATIVE CHOLANGIOGRAM;  Surgeon: Joyice Faster. Cornett, MD;  Location: [facility];  Service: General;  Laterality: N/A;   ENDARTERECTOMY Left 02/23/2016   Procedure: LEFT CAROTID ENDARTERECTOMY WITH XENOSURE BOVINE PERICARDIUM PATCH ANGIOPLASTY;  Surgeon: Serafina Mitchell, MD;  Location: [place] [facility];  Service: Vascular;  Laterality: Left;   ENDARTERECTOMY Right 06/11/2018   Procedure: ENDARTERECTOMY CAROTID RIGHT;  Surgeon: Serafina Mitchell, MD;  Location[facility]  OR;  Service: Vascular;  Laterality: Right;   ESOPHAGOGASTRODUODENOSCOPY N/A 10/19/2019   one benign-appearing, intrinsic moderate stenosis s/p dilation. Moderate gastritis due to aspirin, mild duodenitis.    JOINT REPLACEMENT     KNEE ARTHROSCOPY Right    LITHOTRIPSY     NEPHRECTOMY Right 2008   [facility] ANGIOPLASTY Right 06/11/2018   Procedure: PATCH ANGIOPLASTY WITH Weyman Pedro;  Surgeon: Serafina Mitchell, MD;  [facility] OR;  Service: Vascular;  Laterality: Right;   SAVORY DILATION N/A 10/19/2019   Procedure: SAVORY DILATION;  Surgeon: Danie Binder, MD;   [facility];  Service: Endoscopy;  Laterality: N/A;   TOTAL KNEE ARTHROPLASTY  2010   TOTAL KNEE ARTHROPLASTY Left 2014    Social History   Socioeconomic History   Marital status: Married    Spouse name: Not on file   Number of children: Not on file   Years of education: Not on file   Highest education level: Not on file  Occupational History   Occupation: retired  Tobacco Use   Smoking status: Never   Smokeless tobacco: Never  Vaping Use   Vaping Use: Never used  Substance and Sexual Activity   Alcohol use: No    Alcohol/week: 0.0 standard drinks   Drug use: No   Sexual activity: Yes    Birth control/protection: None  Other Topics Concern   Not on file  Social History Narrative   Does not regularly exercise.    Social Determinants of Health   Financial Resource Strain: Not on file  Food Insecurity: Not on file  Transportation Needs: Not on file  Physical Activity: Not on file  Stress: Not on file  Social Connections: Not on file  Intimate Partner Violence: Not on file    Family History  Problem Relation Age of Onset   Cancer Other    Stroke Other    Coronary artery disease Other    Hypertension Other    Hypertension Mother    Peripheral Artery Disease Mother        had leg bypass, and died after surgery; believed to be from throwing a clot   CVA Father    Prostate cancer Father    Anesthesia problems Neg Hx    Hypotension Neg Hx    Malignant hyperthermia Neg Hx    Pseudochol deficiency Neg Hx     Current Outpatient Medications  Medication Sig Dispense Refill   acetaminophen (TYLENOL) 500 MG tablet Take 1,000 mg by mouth daily as needed for moderate pain.     amLODipine (NORVASC) 10 MG tablet Take 10 mg by mouth daily.     Artificial Tear Solution (SOOTHE XP OP) Place 1 drop into both eyes 3 (three) times daily.     aspirin EC 81 MG tablet Take 1 tablet (81 mg total) by mouth daily. 30 tablet 3   atorvastatin (LIPITOR) 40 MG tablet Take 40  mg by mouth at bedtime.      carvedilol (COREG) 6.25 MG tablet Take 3.125 mg by mouth 2 (two) times daily with a meal.     cetirizine (ZYRTEC) 10 MG tablet Take 10 mg by mouth daily.     Cholecalciferol (VITAMIN D) 2000 units tablet Take 2,000 Units by mouth daily.     furosemide (LASIX) 20 MG tablet Take 20 mg by mouth daily.     hydrALAZINE (APRESOLINE) 50 MG tablet Take 50 mg by mouth 2 (two) times daily.     HYDROcodone-acetaminophen (NORCO) 7.5-325 MG tablet  Take 1 tablet by mouth as needed for moderate pain. Takes one tablet 2 times a week.     olmesartan (BENICAR) 40 MG tablet Take 40 mg by mouth daily.     pantoprazole (PROTONIX) 40 MG tablet Take 40 mg by mouth every other day.     tamsulosin (FLOMAX) 0.4 MG CAPS capsule Take 1 capsule (0.4 mg total) by mouth at bedtime. 30 capsule 11   No current facility-administered medications for this visit.    Allergies  Allergen Reactions   Nsaids     Patient has only 1 kidney and Doctor  Doesn't want him to take   Vicodin [Hydrocodone-Acetaminophen] Nausea And Vomiting    IV only; no problems taking oral tablet     REVIEW OF SYSTEMS:   [X]  denotes positive finding, [ ]  denotes negative finding Cardiac  Comments:  Chest pain or chest pressure:    Shortness of breath upon exertion:    Short of breath when lying flat:    Irregular heart rhythm:        Vascular    Pain in calf, thigh, or hip brought on by ambulation:    Pain in feet at night that wakes you up from your sleep:     Blood clot in your veins:    Leg swelling:  X       Pulmonary    Oxygen at home:    Productive cough:     Wheezing:         Neurologic    Sudden weakness in arms or legs:     Sudden numbness in arms or legs:     Sudden onset of difficulty speaking or slurred speech:    Temporary loss of vision in one eye:     Problems with dizziness:  X       Gastrointestinal    Blood in stool:     Vomited blood:         Genitourinary    Burning when  urinating:     Blood in urine:        Psychiatric    Major depression:         Hematologic    Bleeding problems:    Problems with blood clotting too easily:        Skin    Rashes or ulcers:        Constitutional    Fever or chills:      PHYSICAL EXAMINATION:  Vitals:   07/28/21 1242 07/28/21 1244  BP: (!) 146/81 (!) 148/75  Pulse: 69   Resp: 20   Temp: 98 F (36.7 C)   TempSrc: Temporal   SpO2: 96%   Weight: 218 lb 9.6 oz (99.2 kg)   Height: 6\' 1"  (1.854 m)     General:  WDWN in NAD; vital signs documented above Gait: Normal HENT: WNL, normocephalic Pulmonary: normal non-labored breathing , without  wheezing Cardiac: regular HR, without  Murmurs without carotid bruit Abdomen: soft, NT, no masses Vascular Exam/Pulses:2+ radial pulses bilaterally,2+ pedal pulses bilaterally Extremities: without ischemic changes, without Gangrene , without cellulitis; without open wounds;  Musculoskeletal: no muscle wasting or atrophy  Neurologic: A&O X 3;  No focal weakness or paresthesias are detected Psychiatric:  The pt has Normal affect.   Non-Invasive Vascular Imaging:   Summary:  Right Carotid: Velocities in the right ICA are consistent with a 1-39% stenosis.   Left Carotid: Velocities in the left ICA are consistent with a 1-39% stenosis.  Vertebrals:  Bilateral vertebral arteries demonstrate antegrade flow. Subclavians: Normal flow hemodynamics were seen in bilateral subclavian arteries.    ASSESSMENT/PLAN:: 87 y.o. male here for follow up for carotid artery disease. He has history of bilateral carotid endarterectomy with bovine pericardial patch. His left was performed in July of 2017 for symptomatic stenosis and the right was performed in October of 2019. This was for asymptomatic stenosis. He remains without any new neurological symptoms. He has intermittent dizzy episodes. I do not think this is related to his carotid disease. He has normal flow in his vertebral  arteries and subclavians. I suspect this is either secondary to medication or blood pressure. Advised him to follow up with his PCP if it worsens.  - Continue Aspirin and Statin - discussed importance of continued blood pressure control - Reviewed signs and symptoms of TIA/ Stroke and he understands should these occur to call 911 or seek immediate medical attention - he will follow up again in 1 year with repeat carotid duplex   Karoline Caldwell, PA-C [facility] [phone]  Clinic MD:   Virl Cagey

## 2021-08-02 DIAGNOSIS — H43393 Other vitreous opacities, bilateral: Secondary | ICD-10-CM | POA: Diagnosis not present

## 2021-08-10 DIAGNOSIS — Z299 Encounter for prophylactic measures, unspecified: Secondary | ICD-10-CM | POA: Diagnosis not present

## 2021-08-10 DIAGNOSIS — I700 Atherosclerosis of aorta: Secondary | ICD-10-CM | POA: Diagnosis not present

## 2021-08-10 DIAGNOSIS — I10 Essential (primary) hypertension: Secondary | ICD-10-CM | POA: Diagnosis not present

## 2021-08-10 DIAGNOSIS — I509 Heart failure, unspecified: Secondary | ICD-10-CM | POA: Diagnosis not present

## 2021-09-19 DIAGNOSIS — E782 Mixed hyperlipidemia: Secondary | ICD-10-CM | POA: Diagnosis not present

## 2021-09-19 DIAGNOSIS — I10 Essential (primary) hypertension: Secondary | ICD-10-CM | POA: Diagnosis not present

## 2021-10-16 ENCOUNTER — Other Ambulatory Visit: Payer: Medicare Other

## 2021-10-23 ENCOUNTER — Other Ambulatory Visit: Payer: Self-pay

## 2021-10-23 ENCOUNTER — Other Ambulatory Visit: Payer: Medicare Other

## 2021-10-23 ENCOUNTER — Ambulatory Visit: Payer: Medicare Other | Admitting: Urology

## 2021-10-23 DIAGNOSIS — R972 Elevated prostate specific antigen [PSA]: Secondary | ICD-10-CM

## 2021-10-24 LAB — PSA: Prostate Specific Ag, Serum: 7.8 ng/mL — ABNORMAL HIGH (ref 0.0–4.0)

## 2021-10-25 ENCOUNTER — Ambulatory Visit: Payer: Medicare Other | Admitting: Urology

## 2021-10-30 ENCOUNTER — Encounter: Payer: Self-pay | Admitting: Urology

## 2021-10-30 ENCOUNTER — Ambulatory Visit: Payer: Medicare Other | Admitting: Urology

## 2021-10-30 ENCOUNTER — Other Ambulatory Visit: Payer: Self-pay

## 2021-10-30 VITALS — BP 164/87 | HR 66

## 2021-10-30 DIAGNOSIS — R972 Elevated prostate specific antigen [PSA]: Secondary | ICD-10-CM | POA: Diagnosis not present

## 2021-10-30 DIAGNOSIS — R351 Nocturia: Secondary | ICD-10-CM | POA: Diagnosis not present

## 2021-10-30 NOTE — Addendum Note (Signed)
Addended byIris Pert on: 10/30/2021 05:09 PM ? ? Modules accepted: Orders ? ?

## 2021-10-30 NOTE — Progress Notes (Signed)
10/30/2021 3:00 PM   Darius Baxter 1934-04-17 [id number]  Referring provider: Monico Blitz, MD [place],  [place] [address]  Followup nocturia and elevated PSA   HPI: [name] is a 88yo here for followup for nocturia and elevated PSA. PSA increased to 7.8 from 6.5. No worsening LUTS. IPSS 13 QOl 2 on no BPH therapy. Nocturia 1-3x.  No other complaints today.   PMH: Past Medical History:  Diagnosis Date   Arthritis    Cancer of kidney ([place])    Carotid artery disease ([place])    Chronic lower back pain    Coronary artery disease    Mild at cardiac catheterization 2013   Essential hypertension    History of blood transfusion 2008; 2010   Mitral valve prolapse    Mild mitral regurgitation   Mixed hyperlipidemia    Myocardial infarction ([place])    2013   Nocturia    Renal cancer ([place])    Status post right nephrectomy - 09/2006   Seizures ([place])    Childhood-88 yrs old "passed out"   Takotsubo cardiomyopathy    February 2013   Ventricular ectopy    Bigeminy    Surgical History: Past Surgical History:  Procedure Laterality Date   APPENDECTOMY     BIOPSY  10/19/2019   Procedure: BIOPSY;  Surgeon: Danie Binder, MD;  [facility];  Service: Endoscopy;;   CARDIAC CATHETERIZATION     CAROTID ENDARTERECTOMY Left 02/23/2016   CAROTID ENDARTERECTOMY Right 06/11/2018   PATCH ANGIOPLASTY WITH XENOSURE PATCH   CATARACT EXTRACTION W/PHACO  03/22/2011   Procedure: CATARACT EXTRACTION PHACO AND INTRAOCULAR LENS PLACEMENT ([place]);  Surgeon: Tonny Branch;  Location: [facility];  Service: Ophthalmology;  Laterality: Right;  CDE  19.88   CATARACT EXTRACTION W/PHACO  04/16/2011   Procedure: CATARACT EXTRACTION PHACO AND INTRAOCULAR LENS PLACEMENT ([facility]);  Surgeon: Tonny Branch;  Location: [facility];  Service: Ophthalmology;  Laterality: Left;  CDE 21.13   CHOLECYSTECTOMY  01/29/2012   Procedure: LAPAROSCOPIC CHOLECYSTECTOMY WITH INTRAOPERATIVE CHOLANGIOGRAM;  Surgeon: Joyice Faster. Cornett,  MD;  Location: [place];  Service: General;  Laterality: N/A;   ENDARTERECTOMY Left 02/23/2016   Procedure: LEFT CAROTID ENDARTERECTOMY WITH XENOSURE BOVINE PERICARDIUM PATCH ANGIOPLASTY;  Surgeon: Serafina Mitchell, MD;  Location: [place];  Service: Vascular;  Laterality: Left;   ENDARTERECTOMY Right 06/11/2018   Procedure: ENDARTERECTOMY CAROTID RIGHT;  Surgeon: Serafina Mitchell, MD;  [facility];  Service: Vascular;  Laterality: Right;   ESOPHAGOGASTRODUODENOSCOPY N/A 10/19/2019   one benign-appearing, intrinsic moderate stenosis s/p dilation. Moderate gastritis due to aspirin, mild duodenitis.    JOINT REPLACEMENT     KNEE ARTHROSCOPY Right    LITHOTRIPSY     NEPHRECTOMY Right 2008   [facility] ANGIOPLASTY Right 06/11/2018   Procedure: PATCH ANGIOPLASTY WITH Weyman Pedro;  Surgeon: Serafina Mitchell, MD;  Location: [facility] OR;  Service: Vascular;  Laterality: Right;   SAVORY DILATION N/A 10/19/2019   Procedure: SAVORY DILATION;  Surgeon: Danie Binder, MD;  [facility];  Service: Endoscopy;  Laterality: N/A;   TOTAL KNEE ARTHROPLASTY  2010   TOTAL KNEE ARTHROPLASTY Left 2014    Home Medications:  Allergies as of 10/30/2021       Reactions   Nsaids    Patient has only 1 kidney and Doctor  Doesn't want him to take   Vicodin [hydrocodone-acetaminophen] Nausea And Vomiting   IV only; no problems taking oral tablet  Medication List        Accurate as of October 30, 2021  3:00 PM. If you have any questions, ask your nurse or doctor.          acetaminophen 500 MG tablet Commonly known as: TYLENOL Take 1,000 mg by mouth daily as needed for moderate pain.   amLODipine 10 MG tablet Commonly known as: NORVASC Take 10 mg by mouth daily.   amLODipine 5 MG tablet Commonly known as: NORVASC Take 5 mg by mouth at bedtime.   aspirin EC 81 MG tablet Take 1 tablet (81 mg total) by mouth daily.   atorvastatin 40 MG tablet Commonly known as: LIPITOR Take 40 mg by mouth at  bedtime.   carvedilol 6.25 MG tablet Commonly known as: COREG Take 3.125 mg by mouth 2 (two) times daily with a meal.   cetirizine 10 MG tablet Commonly known as: ZYRTEC Take 10 mg by mouth daily.   furosemide 20 MG tablet Commonly known as: LASIX Take 20 mg by mouth daily.   hydrALAZINE 50 MG tablet Commonly known as: APRESOLINE Take 50 mg by mouth 2 (two) times daily.   HYDROcodone-acetaminophen 7.5-325 MG tablet Commonly known as: NORCO Take 1 tablet by mouth as needed for moderate pain. Takes one tablet 2 times a week.   olmesartan 40 MG tablet Commonly known as: BENICAR Take 40 mg by mouth daily.   pantoprazole 40 MG tablet Commonly known as: PROTONIX Take 40 mg by mouth every other day.   SOOTHE XP OP Place 1 drop into both eyes 3 (three) times daily.   tamsulosin 0.4 MG Caps capsule Commonly known as: FLOMAX Take 1 capsule (0.4 mg total) by mouth at bedtime.   Vitamin D 50 MCG (2000 UT) tablet Take 2,000 Units by mouth daily.        Allergies:  Allergies  Allergen Reactions   Nsaids     Patient has only 1 kidney and Doctor  Doesn't want him to take   Vicodin [Hydrocodone-Acetaminophen] Nausea And Vomiting    IV only; no problems taking oral tablet    Family History: Family History  Problem Relation Age of Onset   Cancer Other    Stroke Other    Coronary artery disease Other    Hypertension Other    Hypertension Mother    Peripheral Artery Disease Mother        had leg bypass, and died after surgery; believed to be from throwing a clot   CVA Father    Prostate cancer Father    Anesthesia problems Neg Hx    Hypotension Neg Hx    Malignant hyperthermia Neg Hx    Pseudochol deficiency Neg Hx     Social History:  reports that he has never smoked. He has never used smokeless tobacco. He reports that he does not drink alcohol and does not use drugs.  ROS: All other review of systems were reviewed and are negative except what is noted above in  HPI  Physical Exam: BP (!) 164/87    Pulse 66   Constitutional:  Alert and oriented, No acute distress. HEENT:  AT, moist mucus membranes.  Trachea midline, no masses. Cardiovascular: No clubbing, cyanosis, or edema. Respiratory: Normal respiratory effort, no increased work of breathing. GI: Abdomen is soft, nontender, nondistended, no abdominal masses GU: No CVA tenderness.  Lymph: No cervical or inguinal lymphadenopathy. Skin: No rashes, bruises or suspicious lesions. Neurologic: Grossly intact, no focal deficits, moving all 4 extremities. Psychiatric: Normal  mood and affect.  Laboratory Data: Lab Results  Component Value Date   WBC 7.0 05/23/2019   HGB 15.2 05/23/2019   HCT 47.6 05/23/2019   MCV 97.3 05/23/2019   PLT 155 05/23/2019    Lab Results  Component Value Date   CREATININE 1.59 (H) 03/30/2021    No results found for: PSA  No results found for: TESTOSTERONE  No results found for: HGBA1C  Urinalysis    Component Value Date/Time   COLORURINE YELLOW 06/05/2018 1102   APPEARANCEUR Clear 08/10/2020 1503   LABSPEC 1.009 06/05/2018 1102   PHURINE 5.0 06/05/2018 1102   GLUCOSEU Negative 08/10/2020 1503   HGBUR SMALL (A) 06/05/2018 1102   BILIRUBINUR Negative 08/10/2020 1503   KETONESUR NEGATIVE 06/05/2018 1102   PROTEINUR Negative 08/10/2020 1503   PROTEINUR NEGATIVE 06/05/2018 1102   NITRITE Negative 08/10/2020 1503   NITRITE NEGATIVE 06/05/2018 1102   LEUKOCYTESUR Negative 08/10/2020 1503    Lab Results  Component Value Date   LABMICR Comment 08/10/2020   BACTERIA RARE (A) 06/05/2018    Pertinent Imaging:  No results found for this or any previous visit.  No results found for this or any previous visit.  No results found for this or any previous visit.  No results found for this or any previous visit.  No results found for this or any previous visit.  No results found for this or any previous visit.  No results found for this or any  previous visit.  No results found for this or any previous visit.   Assessment & Plan:    1. Elevated PSA -RTC 6 months with a PSA  2. Nocturia -We will pursue observation since the patient is not bothered by his nocturia   No follow-ups on file.  Nicolette Bang, MD  [facility]rology [place]

## 2021-10-31 LAB — URINALYSIS, ROUTINE W REFLEX MICROSCOPIC
Bilirubin, UA: NEGATIVE
Glucose, UA: NEGATIVE
Ketones, UA: NEGATIVE
Leukocytes,UA: NEGATIVE
Nitrite, UA: NEGATIVE
Protein,UA: NEGATIVE
RBC, UA: NEGATIVE
Specific Gravity, UA: 1.01 (ref 1.005–1.030)
Urobilinogen, Ur: 0.2 mg/dL (ref 0.2–1.0)
pH, UA: 6 (ref 5.0–7.5)

## 2021-11-06 DIAGNOSIS — H02834 Dermatochalasis of left upper eyelid: Secondary | ICD-10-CM | POA: Diagnosis not present

## 2021-11-06 DIAGNOSIS — H02831 Dermatochalasis of right upper eyelid: Secondary | ICD-10-CM | POA: Diagnosis not present

## 2021-11-06 DIAGNOSIS — H401111 Primary open-angle glaucoma, right eye, mild stage: Secondary | ICD-10-CM | POA: Diagnosis not present

## 2021-11-06 DIAGNOSIS — Z961 Presence of intraocular lens: Secondary | ICD-10-CM | POA: Diagnosis not present

## 2021-11-09 DIAGNOSIS — M48061 Spinal stenosis, lumbar region without neurogenic claudication: Secondary | ICD-10-CM | POA: Diagnosis not present

## 2021-11-09 DIAGNOSIS — I10 Essential (primary) hypertension: Secondary | ICD-10-CM | POA: Diagnosis not present

## 2021-11-09 DIAGNOSIS — Z299 Encounter for prophylactic measures, unspecified: Secondary | ICD-10-CM | POA: Diagnosis not present

## 2021-11-09 DIAGNOSIS — I509 Heart failure, unspecified: Secondary | ICD-10-CM | POA: Diagnosis not present

## 2021-11-09 DIAGNOSIS — I700 Atherosclerosis of aorta: Secondary | ICD-10-CM | POA: Diagnosis not present

## 2021-11-20 DIAGNOSIS — H401111 Primary open-angle glaucoma, right eye, mild stage: Secondary | ICD-10-CM | POA: Diagnosis not present

## 2021-12-17 DIAGNOSIS — K219 Gastro-esophageal reflux disease without esophagitis: Secondary | ICD-10-CM | POA: Diagnosis not present

## 2021-12-17 DIAGNOSIS — I2510 Atherosclerotic heart disease of native coronary artery without angina pectoris: Secondary | ICD-10-CM | POA: Diagnosis not present

## 2021-12-17 DIAGNOSIS — E7800 Pure hypercholesterolemia, unspecified: Secondary | ICD-10-CM | POA: Diagnosis not present

## 2021-12-17 DIAGNOSIS — I10 Essential (primary) hypertension: Secondary | ICD-10-CM | POA: Diagnosis not present

## 2022-01-01 DIAGNOSIS — H02834 Dermatochalasis of left upper eyelid: Secondary | ICD-10-CM | POA: Diagnosis not present

## 2022-01-01 DIAGNOSIS — H02831 Dermatochalasis of right upper eyelid: Secondary | ICD-10-CM | POA: Diagnosis not present

## 2022-01-01 DIAGNOSIS — H401111 Primary open-angle glaucoma, right eye, mild stage: Secondary | ICD-10-CM | POA: Diagnosis not present

## 2022-01-02 ENCOUNTER — Ambulatory Visit: Payer: Medicare Other | Admitting: Cardiology

## 2022-01-02 ENCOUNTER — Encounter: Payer: Self-pay | Admitting: Cardiology

## 2022-01-02 VITALS — BP 140/70 | HR 60 | Ht 73.0 in | Wt 219.2 lb

## 2022-01-02 DIAGNOSIS — Z8679 Personal history of other diseases of the circulatory system: Secondary | ICD-10-CM

## 2022-01-02 DIAGNOSIS — R600 Localized edema: Secondary | ICD-10-CM | POA: Diagnosis not present

## 2022-01-02 DIAGNOSIS — I10 Essential (primary) hypertension: Secondary | ICD-10-CM | POA: Diagnosis not present

## 2022-01-02 NOTE — Progress Notes (Signed)
? ? ?Cardiology Office Note ? ?Date: 01/02/2022  ? ?ID: Darius Baxter, DOB [birth year], MRN [id number] ? ?PCP:  Monico Blitz, MD  ?Cardiologist:  Rozann Lesches, MD ?Electrophysiologist:  None  ? ?Chief Complaint  ?Patient presents with  ? Cardiac follow-up  ? ? ?History of Present Illness: ?Darius Baxter is an 88 y.o. male last seen in July 2022.  He is here today with his wife for a follow-up visit.  Reports reasonable control of leg swelling on current diuretic regimen, no active angina symptoms, no palpitations or syncope. ? ?Follow-up echocardiogram in August 2022 revealed LVEF 65 to 70%, normal RV contraction and estimated RVSP.  He has a history of stress-induced cardiomyopathy. ? ?I personally reviewed his ECG today which shows normal sinus rhythm.  Also went over his medications.  He reports compliance. ? ?Past Medical History:  ?Diagnosis Date  ? Arthritis   ? Cancer of kidney ([place])   ? Carotid artery disease ([place])   ? Chronic lower back pain   ? Coronary artery disease   ? Mild at cardiac catheterization 2013  ? Essential hypertension   ? History of blood transfusion 2008; 2010  ? Mitral valve prolapse   ? Mild mitral regurgitation  ? Mixed hyperlipidemia   ? Myocardial infarction [facility])   ? 2013  ? Nocturia   ? Renal cancer ([place])   ? Status post right nephrectomy - 09/2006  ? Seizures ([place])   ? Childhood-88 yrs old "passed out"  ? Takotsubo cardiomyopathy   ? February 2013  ? Ventricular ectopy   ? Bigeminy  ? ? ?Past Surgical History:  ?Procedure Laterality Date  ? APPENDECTOMY    ? BIOPSY  10/19/2019  ? Procedure: BIOPSY;  Surgeon: Danie Binder, MD;  [facility];  Service: Endoscopy;;  ? CARDIAC CATHETERIZATION    ? CAROTID ENDARTERECTOMY Left 02/23/2016  ? CAROTID ENDARTERECTOMY Right 06/11/2018  ? [place]  ? CATARACT EXTRACTION W/PHACO  03/22/2011  ? Procedure: CATARACT EXTRACTION PHACO AND INTRAOCULAR LENS PLACEMENT ([facility]);  Surgeon: Tonny Branch;  [facility]: [facility];  Service: Ophthalmology;  Laterality: Right;  CDE  19.88  ? CATARACT EXTRACTION W/PHACO  04/16/2011  ? Procedure: CATARACT EXTRACTION PHACO AND INTRAOCULAR LENS PLACEMENT ([facility]);  Surgeon: Tonny Branch;  [facility]: [facility];  Service: Ophthalmology;  Laterality: Left;  CDE 21.13  ? CHOLECYSTECTOMY  01/29/2012  ? Procedure: LAPAROSCOPIC CHOLECYSTECTOMY WITH INTRAOPERATIVE CHOLANGIOGRAM;  Surgeon: Joyice Faster. Cornett, MD;  Location: [place];  Service: General;  Laterality: N/A;  ? ENDARTERECTOMY Left 02/23/2016  ? Procedure: LEFT CAROTID ENDARTERECTOMY WITH XENOSURE BOVINE PERICARDIUM PATCH ANGIOPLASTY;  Surgeon: Serafina Mitchell, MD;  Location: [place];  Service: Vascular;  Laterality: Left;  ? ENDARTERECTOMY Right 06/11/2018  ? Procedure: ENDARTERECTOMY CAROTID RIGHT;  Surgeon: Serafina Mitchell, MD;  Location: [facility] OR;  Service: Vascular;  Laterality: Right;  ? ESOPHAGOGASTRODUODENOSCOPY N/A 10/19/2019  ? one benign-appearing, intrinsic moderate stenosis s/p dilation. Moderate gastritis due to aspirin, mild duodenitis.   ? JOINT REPLACEMENT    ? KNEE ARTHROSCOPY Right   ? LITHOTRIPSY    ? NEPHRECTOMY Right 2008  ? PATCH ANGIOPLASTY Right 06/11/2018  ? Procedure: PATCH ANGIOPLASTY WITH Weyman Pedro;  Surgeon: Serafina Mitchell, MD;  Location: [place];  Service: Vascular;  Laterality: Right;  ? SAVORY DILATION N/A 10/19/2019  ? Procedure: SAVORY DILATION;  Surgeon: Danie Binder, MD;  [facility];  Service: Endoscopy;  Laterality: N/A;  ?  TOTAL KNEE ARTHROPLASTY  2010  ? TOTAL KNEE ARTHROPLASTY Left 2014  ? ? ?Current Outpatient Medications  ?Medication Sig Dispense Refill  ? acetaminophen (TYLENOL) 500 MG tablet Take 1,000 mg by mouth daily as needed for moderate pain.    ? amLODipine (NORVASC) 10 MG tablet Take 10 mg by mouth daily.    ? amLODipine (NORVASC) 5 MG tablet Take 5 mg by mouth at bedtime.    ? Artificial Tear Solution (SOOTHE XP OP) Place 1 drop into both eyes 3 (three) times daily.    ? aspirin EC 81 MG  tablet Take 1 tablet (81 mg total) by mouth daily. 30 tablet 3  ? atorvastatin (LIPITOR) 40 MG tablet Take 40 mg by mouth at bedtime.     ? carvedilol (COREG) 6.25 MG tablet Take 3.125 mg by mouth 2 (two) times daily with a meal.    ? cetirizine (ZYRTEC) 10 MG tablet Take 10 mg by mouth daily.    ? Cholecalciferol (VITAMIN D) 2000 units tablet Take 2,000 Units by mouth daily.    ? furosemide (LASIX) 20 MG tablet Take 20 mg by mouth daily.    ? hydrALAZINE (APRESOLINE) 50 MG tablet Take 50 mg by mouth 2 (two) times daily.    ? HYDROcodone-acetaminophen (NORCO) 7.5-325 MG tablet Take 1 tablet by mouth as needed for moderate pain. Takes one tablet 2 times a week.    ? olmesartan (BENICAR) 40 MG tablet Take 40 mg by mouth daily.    ? pantoprazole (PROTONIX) 40 MG tablet Take 40 mg by mouth every other day. (Patient not taking: Reported on 10/30/2021)    ? tamsulosin (FLOMAX) 0.4 MG CAPS capsule Take 1 capsule (0.4 mg total) by mouth at bedtime. (Patient not taking: Reported on 10/30/2021) 30 capsule 11  ? ?No current facility-administered medications for this visit.  ? ?Allergies:  Nsaids and Vicodin [hydrocodone-acetaminophen]  ? ?ROS: No orthopnea or PND.  Chronic arthritic pain and stiffness. ? ?Physical Exam: ?VS:  BP 140/70   Pulse 60   Ht '6\' 1"'$  (1.854 m)   Wt 219 lb 3.2 oz (99.4 kg)   SpO2 96%   BMI 28.92 kg/m? , BMI Body mass index is 28.92 kg/m?. ? ?Wt Readings from Last 3 Encounters:  ?01/02/22 219 lb 3.2 oz (99.4 kg)  ?07/28/21 218 lb 9.6 oz (99.2 kg)  ?03/15/21 222 lb 9.6 oz (101 kg)  ?  ?General: Patient appears comfortable at rest. ?HEENT: Conjunctiva and lids normal. ?Neck: Supple, no elevated JVP or carotid bruits, no thyromegaly. ?Lungs: Clear to auscultation, nonlabored breathing at rest. ?Cardiac: Regular rate and rhythm, no S3 or significant systolic murmur, no pericardial rub. ?Extremities: Mild lower leg edema/lymphedema. ? ?ECG:  An ECG dated 12/22/2020 was personally reviewed today and  demonstrated:  Sinus rhythm. ? ?Recent Labwork: ?03/30/2021: BUN 37; Creatinine, Ser 1.59; Potassium 5.7; Sodium 135  ? ?Other Studies Reviewed Today: ? ?Echocardiogram 03/30/2021: ? 1. Left ventricular ejection fraction, by estimation, is 65 to 70%. The  ?left ventricle has normal function. The left ventricle has no regional  ?wall motion abnormalities. Left ventricular diastolic parameters were  ?normal.  ? 2. Right ventricular systolic function is normal. The right ventricular  ?size is normal. There is normal pulmonary artery systolic pressure.  ? 3. The mitral valve is myxomatous. Trivial mitral valve regurgitation.  ?There is mild prolapse of of the mitral valve.  ? 4. The aortic valve is tricuspid. Aortic valve regurgitation is trivial.  ?Mild aortic valve  sclerosis is present, with no evidence of aortic valve  ?stenosis.  ? 5. The inferior vena cava is dilated in size with >50% respiratory  ?variability, suggesting right atrial pressure of 8 mmHg.  ? ?Assessment and Plan: ? ?1.  Bilateral leg edema and lymphedema, adequately controlled at this time on current diuretic regimen.  No changes were made today. ? ?2.  History of stress-induced cardiomyopathy.  Follow-up echocardiogram in August of last year showed vigorous LVEF at 65 to 70%. ? ?3.  Myxomatous mitral valve with mild prolapse and trivial mitral regurgitation.  Asymptomatic. ? ?4.  Essential hypertension, continue current regimen including Norvasc, Coreg, hydralazine, and Benicar. ? ?Medication Adjustments/Labs and Tests Ordered: ?Current medicines are reviewed at length with the patient today.  Concerns regarding medicines are outlined above.  ? ?Tests Ordered: ?Orders Placed This Encounter  ?Procedures  ? EKG 12-Lead  ? ? ?Medication Changes: ?No orders of the defined types were placed in this encounter. ? ? ?Disposition:  Follow up  6 months. ? ?Signed, ?Satira Sark, MD, [facility] ?01/02/2022 2:31 PM    ?[place] at  [facility] ?[address], [place], [place] [address] ?Phone: [phone]; Fax: [phone]  ?

## 2022-01-02 NOTE — Patient Instructions (Addendum)

## 2022-01-23 DIAGNOSIS — Z0000 Encounter for general adult medical examination without abnormal findings: Secondary | ICD-10-CM | POA: Diagnosis not present

## 2022-01-23 DIAGNOSIS — R5383 Other fatigue: Secondary | ICD-10-CM | POA: Diagnosis not present

## 2022-01-23 DIAGNOSIS — Z299 Encounter for prophylactic measures, unspecified: Secondary | ICD-10-CM | POA: Diagnosis not present

## 2022-01-23 DIAGNOSIS — I509 Heart failure, unspecified: Secondary | ICD-10-CM | POA: Diagnosis not present

## 2022-01-23 DIAGNOSIS — E7800 Pure hypercholesterolemia, unspecified: Secondary | ICD-10-CM | POA: Diagnosis not present

## 2022-01-23 DIAGNOSIS — Z7189 Other specified counseling: Secondary | ICD-10-CM | POA: Diagnosis not present

## 2022-01-23 DIAGNOSIS — I10 Essential (primary) hypertension: Secondary | ICD-10-CM | POA: Diagnosis not present

## 2022-01-23 DIAGNOSIS — Z79899 Other long term (current) drug therapy: Secondary | ICD-10-CM | POA: Diagnosis not present

## 2022-04-12 DIAGNOSIS — H02831 Dermatochalasis of right upper eyelid: Secondary | ICD-10-CM | POA: Diagnosis not present

## 2022-04-12 DIAGNOSIS — H401111 Primary open-angle glaucoma, right eye, mild stage: Secondary | ICD-10-CM | POA: Diagnosis not present

## 2022-04-12 DIAGNOSIS — H02834 Dermatochalasis of left upper eyelid: Secondary | ICD-10-CM | POA: Diagnosis not present

## 2022-04-30 ENCOUNTER — Other Ambulatory Visit: Payer: Medicare Other

## 2022-04-30 DIAGNOSIS — R972 Elevated prostate specific antigen [PSA]: Secondary | ICD-10-CM

## 2022-05-01 LAB — PSA, TOTAL AND FREE
PSA, Free Pct: 14.1 %
PSA, Free: 1.16 ng/mL
Prostate Specific Ag, Serum: 8.2 ng/mL — ABNORMAL HIGH (ref 0.0–4.0)

## 2022-05-02 ENCOUNTER — Encounter: Payer: Self-pay | Admitting: Urology

## 2022-05-02 ENCOUNTER — Ambulatory Visit: Payer: Medicare Other | Admitting: Urology

## 2022-05-02 VITALS — BP 186/91 | HR 73

## 2022-05-02 DIAGNOSIS — R351 Nocturia: Secondary | ICD-10-CM | POA: Diagnosis not present

## 2022-05-02 DIAGNOSIS — R972 Elevated prostate specific antigen [PSA]: Secondary | ICD-10-CM

## 2022-05-02 LAB — MICROSCOPIC EXAMINATION
RBC, Urine: NONE SEEN /hpf (ref 0–2)
Renal Epithel, UA: NONE SEEN /hpf

## 2022-05-02 LAB — URINALYSIS, ROUTINE W REFLEX MICROSCOPIC
Bilirubin, UA: NEGATIVE
Glucose, UA: NEGATIVE
Nitrite, UA: POSITIVE — AB
Protein,UA: NEGATIVE
RBC, UA: NEGATIVE
Specific Gravity, UA: 1.015 (ref 1.005–1.030)
Urobilinogen, Ur: 0.2 mg/dL (ref 0.2–1.0)
pH, UA: 5.5 (ref 5.0–7.5)

## 2022-05-02 NOTE — Progress Notes (Signed)
05/02/2022 4:05 PM   Darius Baxter 1934-02-22 [id number]  Referring provider: Monico Blitz, MD [place],  [place] [address]  Followup PSA and nocturia   HPI: Mr Darius Baxter is a 88yo here for followup for elevated PSA and nocturia. PSA increased to 8.2 from 7.8. IPSS 12 QOL 1 on no BPH therapy. He is not bothered by his nocturia. Nocturis 2-3x. Urine stream strong. NO straining to urinate.    PMH: Past Medical History:  Diagnosis Date   Arthritis    Cancer of kidney ([place])    Carotid artery disease ([place])    Chronic lower back pain    Coronary artery disease    Mild at cardiac catheterization 2013   Essential hypertension    History of blood transfusion 2008; 2010   Mitral valve prolapse    Mild mitral regurgitation   Mixed hyperlipidemia    Myocardial infarction ([employer])    2013   Nocturia    Renal cancer ([place])    Status post right nephrectomy - 09/2006   Seizures ([place])    Childhood-88 yrs old "passed out"   Takotsubo cardiomyopathy    February 2013   Ventricular ectopy    Bigeminy    Surgical History: Past Surgical History:  Procedure Laterality Date   APPENDECTOMY     BIOPSY  10/19/2019   Procedure: BIOPSY;  Surgeon: Danie Binder, MD;  [facility];  Service: Endoscopy;;   CARDIAC CATHETERIZATION     CAROTID ENDARTERECTOMY Left 02/23/2016   CAROTID ENDARTERECTOMY Right 06/11/2018   PATCH ANGIOPLASTY WITH XENOSURE PATCH   CATARACT EXTRACTION W/PHACO  03/22/2011   Procedure: CATARACT EXTRACTION PHACO AND INTRAOCULAR LENS PLACEMENT ([place]);  Surgeon: Darius Baxter;  Location: [facility];  Service: Ophthalmology;  Laterality: Right;  CDE  19.88   CATARACT EXTRACTION W/PHACO  04/16/2011   Procedure: CATARACT EXTRACTION PHACO AND INTRAOCULAR LENS PLACEMENT (IOC);  Surgeon: Darius Baxter;  Location: [facility];  Service: Ophthalmology;  Laterality: Left;  CDE 21.13   CHOLECYSTECTOMY  01/29/2012   Procedure: LAPAROSCOPIC CHOLECYSTECTOMY WITH INTRAOPERATIVE CHOLANGIOGRAM;   Surgeon: Darius Faster. Cornett, MD;  Location: [place];  Service: General;  Laterality: N/A;   ENDARTERECTOMY Left 02/23/2016   Procedure: LEFT CAROTID ENDARTERECTOMY WITH XENOSURE BOVINE PERICARDIUM PATCH ANGIOPLASTY;  Surgeon: Darius Mitchell, MD;  Location: [place];  Service: Vascular;  Laterality: Left;   ENDARTERECTOMY Right 06/11/2018   Procedure: ENDARTERECTOMY CAROTID RIGHT;  Surgeon: Darius Mitchell, MD;  [facility];  Service: Vascular;  Laterality: Right;   ESOPHAGOGASTRODUODENOSCOPY N/A 10/19/2019   one benign-appearing, intrinsic moderate stenosis s/p dilation. Moderate gastritis due to aspirin, mild duodenitis.    JOINT REPLACEMENT     KNEE ARTHROSCOPY Right    LITHOTRIPSY     NEPHRECTOMY Right 2008   [facility] ANGIOPLASTY Right 06/11/2018   Procedure: PATCH ANGIOPLASTY WITH Weyman Pedro;  Surgeon: Darius Mitchell, MD;  Location: [facility] OR;  Service: Vascular;  Laterality: Right;   SAVORY DILATION N/A 10/19/2019   Procedure: SAVORY DILATION;  Surgeon: Danie Binder, MD;  [facility];  Service: Endoscopy;  Laterality: N/A;   TOTAL KNEE ARTHROPLASTY  2010   TOTAL KNEE ARTHROPLASTY Left 2014    Home Medications:  Allergies as of 05/02/2022       Reactions   Nsaids    Patient has only 1 kidney and Doctor  Doesn't want him to take   Vicodin [hydrocodone-acetaminophen] Nausea And Vomiting   IV only; no problems taking  oral tablet        Medication List        Accurate as of May 02, 2022  4:05 PM. If you have any questions, ask your nurse or doctor.          acetaminophen 500 MG tablet Commonly known as: TYLENOL Take 1,000 mg by mouth daily as needed for moderate pain.   amLODipine 10 MG tablet Commonly known as: NORVASC Take 10 mg by mouth daily.   amLODipine 5 MG tablet Commonly known as: NORVASC Take 5 mg by mouth at bedtime.   aspirin EC 81 MG tablet Take 1 tablet (81 mg total) by mouth daily.   atorvastatin 40 MG tablet Commonly known as:  LIPITOR Take 40 mg by mouth at bedtime.   carvedilol 6.25 MG tablet Commonly known as: COREG Take 3.125 mg by mouth 2 (two) times daily with a meal.   cetirizine 10 MG tablet Commonly known as: ZYRTEC Take 10 mg by mouth daily.   furosemide 20 MG tablet Commonly known as: LASIX Take 20 mg by mouth daily.   hydrALAZINE 50 MG tablet Commonly known as: APRESOLINE Take 50 mg by mouth 2 (two) times daily.   HYDROcodone-acetaminophen 7.5-325 MG tablet Commonly known as: NORCO Take 1 tablet by mouth as needed for moderate pain. Takes one tablet 2 times a week.   olmesartan 40 MG tablet Commonly known as: BENICAR Take 40 mg by mouth daily.   pantoprazole 40 MG tablet Commonly known as: PROTONIX Take 40 mg by mouth every other day.   SOOTHE XP OP Place 1 drop into both eyes 3 (three) times daily.   tamsulosin 0.4 MG Caps capsule Commonly known as: FLOMAX Take 1 capsule (0.4 mg total) by mouth at bedtime.   Vitamin D 50 MCG (2000 UT) tablet Take 2,000 Units by mouth daily.        Allergies:  Allergies  Allergen Reactions   Nsaids     Patient has only 1 kidney and Doctor  Doesn't want him to take   Vicodin [Hydrocodone-Acetaminophen] Nausea And Vomiting    IV only; no problems taking oral tablet    Family History: Family History  Problem Relation Age of Onset   Cancer Other    Stroke Other    Coronary artery disease Other    Hypertension Other    Hypertension Mother    Peripheral Artery Disease Mother        had leg bypass, and died after surgery; believed to be from throwing a clot   CVA Father    Prostate cancer Father    Anesthesia problems Neg Hx    Hypotension Neg Hx    Malignant hyperthermia Neg Hx    Pseudochol deficiency Neg Hx     Social History:  reports that he has never smoked. He has never used smokeless tobacco. He reports that he does not drink alcohol and does not use drugs.  ROS: All other review of systems were reviewed and are  negative except what is noted above in HPI  Physical Exam: BP (!) 186/91   Pulse 73   Constitutional:  Alert and oriented, No acute distress. HEENT: [place] AT, moist mucus membranes.  Trachea midline, no masses. Cardiovascular: No clubbing, cyanosis, or edema. Respiratory: Normal respiratory effort, no increased work of breathing. GI: Abdomen is soft, nontender, nondistended, no abdominal masses GU: No CVA tenderness.  Lymph: No cervical or inguinal lymphadenopathy. Skin: No rashes, bruises or suspicious lesions. Neurologic: Grossly intact, no  focal deficits, moving all 4 extremities. Psychiatric: Normal mood and affect.  Laboratory Data: Lab Results  Component Value Date   WBC 7.0 05/23/2019   HGB 15.2 05/23/2019   HCT 47.6 05/23/2019   MCV 97.3 05/23/2019   PLT 155 05/23/2019    Lab Results  Component Value Date   CREATININE 1.59 (H) 03/30/2021    No results found for: "PSA"  No results found for: "TESTOSTERONE"  No results found for: "HGBA1C"  Urinalysis    Component Value Date/Time   COLORURINE YELLOW 06/05/2018 1102   APPEARANCEUR Clear 05/02/2022 1542   LABSPEC 1.009 06/05/2018 1102   PHURINE 5.0 06/05/2018 1102   GLUCOSEU Negative 05/02/2022 1542   HGBUR SMALL (A) 06/05/2018 1102   BILIRUBINUR Negative 05/02/2022 1542   KETONESUR NEGATIVE 06/05/2018 1102   PROTEINUR Negative 05/02/2022 1542   PROTEINUR NEGATIVE 06/05/2018 1102   NITRITE Positive (A) 05/02/2022 1542   NITRITE NEGATIVE 06/05/2018 1102   LEUKOCYTESUR 1+ (A) 05/02/2022 1542    Lab Results  Component Value Date   LABMICR See below: 05/02/2022   WBCUA 11-30 (A) 05/02/2022   LABEPIT 0-10 05/02/2022   MUCUS Present 05/02/2022   BACTERIA Many (A) 05/02/2022    Pertinent Imaging:  No results found for this or any previous visit.  No results found for this or any previous visit.  No results found for this or any previous visit.  No results found for this or any previous visit.  No  results found for this or any previous visit.  No results found for this or any previous visit.  No results found for this or any previous visit.  No results found for this or any previous visit.   Assessment & Plan:    1. Elevated PSA -RTC 6 months with PSA - Urinalysis, Routine w reflex microscopic  2. Nocturia -Decrease fluid intake within 2 hours of going to bed   No follow-ups on file.  Nicolette Bang, MD  [facility] [place]

## 2022-05-02 NOTE — Patient Instructions (Signed)

## 2022-05-22 DIAGNOSIS — Z789 Other specified health status: Secondary | ICD-10-CM | POA: Diagnosis not present

## 2022-05-22 DIAGNOSIS — E261 Secondary hyperaldosteronism: Secondary | ICD-10-CM | POA: Diagnosis not present

## 2022-05-22 DIAGNOSIS — Z0000 Encounter for general adult medical examination without abnormal findings: Secondary | ICD-10-CM | POA: Diagnosis not present

## 2022-05-22 DIAGNOSIS — I10 Essential (primary) hypertension: Secondary | ICD-10-CM | POA: Diagnosis not present

## 2022-05-22 DIAGNOSIS — M25561 Pain in right knee: Secondary | ICD-10-CM | POA: Diagnosis not present

## 2022-05-22 DIAGNOSIS — M549 Dorsalgia, unspecified: Secondary | ICD-10-CM | POA: Diagnosis not present

## 2022-05-22 DIAGNOSIS — Z23 Encounter for immunization: Secondary | ICD-10-CM | POA: Diagnosis not present

## 2022-05-22 DIAGNOSIS — Z299 Encounter for prophylactic measures, unspecified: Secondary | ICD-10-CM | POA: Diagnosis not present

## 2022-06-22 DIAGNOSIS — G8929 Other chronic pain: Secondary | ICD-10-CM | POA: Diagnosis not present

## 2022-06-22 DIAGNOSIS — I10 Essential (primary) hypertension: Secondary | ICD-10-CM | POA: Diagnosis not present

## 2022-06-22 DIAGNOSIS — Z299 Encounter for prophylactic measures, unspecified: Secondary | ICD-10-CM | POA: Diagnosis not present

## 2022-07-05 ENCOUNTER — Ambulatory Visit: Payer: Medicare Other | Attending: Cardiology | Admitting: Cardiology

## 2022-07-05 ENCOUNTER — Encounter: Payer: Self-pay | Admitting: Cardiology

## 2022-07-05 VITALS — BP 122/62 | HR 60 | Ht 72.0 in | Wt 206.8 lb

## 2022-07-05 DIAGNOSIS — E782 Mixed hyperlipidemia: Secondary | ICD-10-CM | POA: Diagnosis not present

## 2022-07-05 DIAGNOSIS — I340 Nonrheumatic mitral (valve) insufficiency: Secondary | ICD-10-CM

## 2022-07-05 DIAGNOSIS — I10 Essential (primary) hypertension: Secondary | ICD-10-CM | POA: Diagnosis not present

## 2022-07-05 DIAGNOSIS — Z8679 Personal history of other diseases of the circulatory system: Secondary | ICD-10-CM

## 2022-07-05 NOTE — Progress Notes (Signed)
Cardiology Office Note  Date: 07/05/2022   ID: Darius Baxter, DOB 1934/04/24, MRN [id number]  PCP:  Monico Blitz, MD  Cardiologist:  Rozann Lesches, MD Electrophysiologist:  None   Chief Complaint  Patient presents with   Cardiac follow-up    History of Present Illness: Darius Baxter is an 88 y.o. male last seen in May.  He is here with his wife for a follow-up visit.  Reports no angina, stable NYHA class II dyspnea with typical activities.  No palpitations or syncope.  I went over his medications which are noted below.  He reports compliance with therapy.  Blood pressure is well controlled today.  He has had no fluid retention.  Echocardiogram from last year showed LVEF 65 to 70%, myxomatous mitral valve with only trivial mitral regurgitation.  Past Medical History:  Diagnosis Date   Arthritis    Cancer of kidney ([place])    Carotid artery disease ([place])    Chronic lower back pain    Coronary artery disease    Mild at cardiac catheterization 2013   Essential hypertension    History of blood transfusion 2008; 2010   Mitral valve prolapse    Mild mitral regurgitation   Mixed hyperlipidemia    Myocardial infarction ([place])    2013   Nocturia    Renal cancer ([facility])    Status post right nephrectomy - 09/2006   Seizures ([place])    Childhood-88 yrs old "passed out"   Takotsubo cardiomyopathy    February 2013   Ventricular ectopy    Bigeminy    Past Surgical History:  Procedure Laterality Date   APPENDECTOMY     BIOPSY  10/19/2019   Procedure: BIOPSY;  Surgeon: Danie Binder, MD;  [facility];  Service: Endoscopy;;   CARDIAC CATHETERIZATION     CAROTID ENDARTERECTOMY Left 02/23/2016   CAROTID ENDARTERECTOMY Right 06/11/2018   PATCH ANGIOPLASTY WITH XENOSURE PATCH   CATARACT EXTRACTION W/PHACO  03/22/2011   Procedure: CATARACT EXTRACTION PHACO AND INTRAOCULAR LENS PLACEMENT ([place]);  Surgeon: Tonny Branch;  Location: [facility];  Service: Ophthalmology;  Laterality:  Right;  CDE  19.88   CATARACT EXTRACTION W/PHACO  04/16/2011   Procedure: CATARACT EXTRACTION PHACO AND INTRAOCULAR LENS PLACEMENT ([facility]);  Surgeon: Tonny Branch;  Location: [facility];  Service: Ophthalmology;  Laterality: Left;  CDE 21.13   CHOLECYSTECTOMY  01/29/2012   Procedure: LAPAROSCOPIC CHOLECYSTECTOMY WITH INTRAOPERATIVE CHOLANGIOGRAM;  Surgeon: Joyice Faster. Cornett, MD;  Location: [place];  Service: General;  Laterality: N/A;   ENDARTERECTOMY Left 02/23/2016   Procedure: LEFT CAROTID ENDARTERECTOMY WITH XENOSURE BOVINE PERICARDIUM PATCH ANGIOPLASTY;  Surgeon: Serafina Mitchell, MD;  Location: [place];  Service: Vascular;  Laterality: Left;   ENDARTERECTOMY Right 06/11/2018   Procedure: ENDARTERECTOMY CAROTID RIGHT;  Surgeon: Serafina Mitchell, MD;  [facility];  Service: Vascular;  Laterality: Right;   ESOPHAGOGASTRODUODENOSCOPY N/A 10/19/2019   one benign-appearing, intrinsic moderate stenosis s/p dilation. Moderate gastritis due to aspirin, mild duodenitis.    JOINT REPLACEMENT     KNEE ARTHROSCOPY Right    LITHOTRIPSY     NEPHRECTOMY Right 2008   [facility] ANGIOPLASTY Right 06/11/2018   Procedure: PATCH ANGIOPLASTY WITH Weyman Pedro;  Surgeon: Serafina Mitchell, MD;  Location: [facility] OR;  Service: Vascular;  Laterality: Right;   SAVORY DILATION N/A 10/19/2019   Procedure: SAVORY DILATION;  Surgeon: Danie Binder, MD;  [facility];  Service: Endoscopy;  Laterality: N/A;   TOTAL KNEE  ARTHROPLASTY  2010   TOTAL KNEE ARTHROPLASTY Left 2014    Current Outpatient Medications  Medication Sig Dispense Refill   acetaminophen (TYLENOL) 500 MG tablet Take 1,000 mg by mouth daily as needed for moderate pain.     amLODipine (NORVASC) 5 MG tablet Take 5 mg by mouth at bedtime.     amLODipine (NORVASC) 5 MG tablet Take 5 mg by mouth daily.     aspirin EC 81 MG tablet Take 1 tablet (81 mg total) by mouth daily. 30 tablet 3   atorvastatin (LIPITOR) 40 MG tablet Take 40 mg by mouth at bedtime.       carvedilol (COREG) 6.25 MG tablet Take 3.125 mg by mouth 2 (two) times daily with a meal.     Cholecalciferol (VITAMIN D) 2000 units tablet Take 5,000 Units by mouth daily.     furosemide (LASIX) 20 MG tablet Take 20 mg by mouth daily.     hydrALAZINE (APRESOLINE) 50 MG tablet Take 50 mg by mouth 2 (two) times daily.     HYDROcodone-acetaminophen (NORCO) 7.5-325 MG tablet Take 1 tablet by mouth as needed for moderate pain. Takes one tablet 2 times a week.     olmesartan (BENICAR) 40 MG tablet Take 40 mg by mouth daily.     No current facility-administered medications for this visit.   Allergies:  Nsaids and Vicodin [hydrocodone-acetaminophen]   ROS:  No syncope.  Physical Exam: VS:  BP 122/62 (BP Location: Left Arm, Patient Position: Sitting, Cuff Size: Normal)   Pulse 60   Ht 6' (1.829 m)   Wt 206 lb 12.8 oz (93.8 kg)   SpO2 95%   BMI 28.05 kg/m , BMI Body mass index is 28.05 kg/m.  Wt Readings from Last 3 Encounters:  07/05/22 206 lb 12.8 oz (93.8 kg)  01/02/22 219 lb 3.2 oz (99.4 kg)  07/28/21 218 lb 9.6 oz (99.2 kg)    General: Patient appears comfortable at rest. HEENT: Conjunctiva and lids normal. Neck: Supple, no elevated JVP or carotid bruits. Lungs: Clear to auscultation, nonlabored breathing at rest. Cardiac: Regular rate and rhythm, no S3 or significant systolic murmur, no pericardial rub. Extremities: Mild lower leg edema/lymphedema.  ECG:  An ECG dated 01/02/2022 was personally reviewed today and demonstrated:  Sinus rhythm.  Recent Labwork:  June 2023: Hemoglobin 14.3, platelets 142, BUN 17, creatinine 0.99, potassium 4.4, AST 13, ALT 8, cholesterol 107, triglycerides 132, HDL 43, LDL 41, TSH 2.56  Other Studies Reviewed Today:  Echocardiogram 03/30/2021:  1. Left ventricular ejection fraction, by estimation, is 65 to 70%. The  left ventricle has normal function. The left ventricle has no regional  wall motion abnormalities. Left ventricular diastolic  parameters were  normal.   2. Right ventricular systolic function is normal. The right ventricular  size is normal. There is normal pulmonary artery systolic pressure.   3. The mitral valve is myxomatous. Trivial mitral valve regurgitation.  There is mild prolapse of of the mitral valve.   4. The aortic valve is tricuspid. Aortic valve regurgitation is trivial.  Mild aortic valve sclerosis is present, with no evidence of aortic valve  stenosis.   5. The inferior vena cava is dilated in size with >50% respiratory  variability, suggesting right atrial pressure of 8 mmHg.   Assessment and Plan:  1.  History of stress-induced cardiomyopathy with normalization of LVEF, 65 to 70% by echocardiogram last year.  Currently on Coreg and Benicar.  2.  Myxomatous mitral valve with trivial  mitral regurgitation, asymptomatic.  No change on examination.  3.  Essential hypertension.  He continues on Coreg, Benicar, hydralazine, and Norvasc.  Blood pressure well controlled today.  4.  Mixed hyperlipidemia, on Lipitor.  Last LDL 41.  Medication Adjustments/Labs and Tests Ordered: Current medicines are reviewed at length with the patient today.  Concerns regarding medicines are outlined above.   Tests Ordered: No orders of the defined types were placed in this encounter.   Medication Changes: No orders of the defined types were placed in this encounter.   Disposition:  Follow up  6 months.  Signed, Satira Sark, MD, [facility] 07/05/2022 3:45 PM    [facility] [place], [place], [place] [address] Phone: [phone]; Fax: [phone]

## 2022-07-05 NOTE — Patient Instructions (Addendum)

## 2022-07-19 ENCOUNTER — Ambulatory Visit (INDEPENDENT_AMBULATORY_CARE_PROVIDER_SITE_OTHER): Payer: Medicare Other | Admitting: Orthopaedic Surgery

## 2022-07-19 ENCOUNTER — Ambulatory Visit (INDEPENDENT_AMBULATORY_CARE_PROVIDER_SITE_OTHER): Payer: Medicare Other

## 2022-07-19 ENCOUNTER — Encounter: Payer: Self-pay | Admitting: Orthopaedic Surgery

## 2022-07-19 ENCOUNTER — Ambulatory Visit: Payer: Self-pay

## 2022-07-19 VITALS — Ht 75.0 in | Wt 207.0 lb

## 2022-07-19 DIAGNOSIS — G8929 Other chronic pain: Secondary | ICD-10-CM

## 2022-07-19 DIAGNOSIS — M25562 Pain in left knee: Secondary | ICD-10-CM | POA: Diagnosis not present

## 2022-07-19 DIAGNOSIS — M25561 Pain in right knee: Secondary | ICD-10-CM | POA: Diagnosis not present

## 2022-07-19 NOTE — Progress Notes (Signed)
Office Visit Note   Patient: Darius Baxter           Date of Birth: 03-02-1934           MRN: [id number] Visit Date: 07/19/2022              Requested by: Monico Blitz, MD [address] [place],  [place] [address] PCP: Monico Blitz, MD   Assessment & Plan: Visit Diagnoses:  1. Chronic pain of both knees     Plan: Patient's had both knee replacements.  Right was much longer likely 20+ years ago by Dr. Plumeritis and over the years is worn is probably down now with some AP laxity.  His problem has not been instability has not had problems with right knee swelling.  I will check him in 4 months for recheck no x-rays needed on return.  If he starts developing right knee swelling he will need to proceed with polyethylene revision.  Looks like he has an LCS knee on the right and Attune knee on the left.  Follow-Up Instructions: Return in about 4 months (around 11/17/2022).   Orders:  Orders Placed This Encounter  Procedures   XR Knee 1-2 Views Left   XR Knee 1-2 Views Right   No orders of the defined types were placed in this encounter.     Procedures: No procedures performed   Clinical Data: No additional findings.   Subjective: Chief Complaint  Patient presents with   Right Knee - Pain   Left Knee - Pain    HPI 88 year old male here with his wife with bilateral knee pain plus some low back pain.  He has been told he has arthritis in his back and 2 surgeons that told him that I do not recommend surgery.  Scans from early 2008 showed some stenosis at L4-5 L5-S1 multifactorial.  He has had knee arthroplasties right knee done by Dr. Salome Holmes and left knee done by Dr. Vanita Panda.  He wears knee sleeves on each has a cane but does not use it.  He denies swelling in his right knee.  No recent falls.  He continues to have some problems with abdominal pain and he has had CT abdomen and pelvis in the past for similar problems dating back to 2013.  Review of Systems all of his systems  noncontributory.  Of note is his history of hypertension GERD hyperlipidemia PAD total knee arthroplasties.  Renal CA, PVCs.  Cardiomyopathy.   Objective: Vital Signs: Ht '6\' 3"'$  (1.905 m)   Wt 207 lb (93.9 kg)   BMI 25.87 kg/m   Physical Exam Constitutional:      Appearance: He is well-developed.  HENT:     Head: Normocephalic and atraumatic.     Right Ear: External ear normal.     Left Ear: External ear normal.  Eyes:     Pupils: Pupils are equal, round, and reactive to light.  Neck:     Thyroid: No thyromegaly.     Trachea: No tracheal deviation.  Cardiovascular:     Rate and Rhythm: Normal rate.  Pulmonary:     Effort: Pulmonary effort is normal.     Breath sounds: No wheezing.  Abdominal:     General: Bowel sounds are normal.     Palpations: Abdomen is soft.  Musculoskeletal:     Cervical back: Neck supple.  Skin:    General: Skin is warm and dry.     Capillary Refill: Capillary refill takes less than 2  seconds.  Neurological:     Mental Status: He is alert and oriented to person, place, and time.  Psychiatric:        Behavior: Behavior normal.        Thought Content: Thought content normal.        Judgment: Judgment normal.     Ortho Exam negative logroll hips.  He has some laxity AP on the right knee.  Good quad strength.  Left knee shows good quad strength negative logroll hips.  No pitting edema.  Specialty Comments:  No specialty comments available.  Imaging: No results found.   PMFS History: Patient Active Problem List   Diagnosis Date Noted   GERD (gastroesophageal reflux disease) 02/12/2020   Constipation 02/12/2020   Dysphagia, idiopathic 10/15/2019   Asymptomatic carotid artery stenosis without infarction, right 06/11/2018   Primary malignant neoplasm of kidney with metastasis from kidney to other site [facility]) 09/24/2016   Bilateral impacted cerumen 08/07/2016   Chronic back pain 07/24/2016   Peripheral arterial disease ([place]) 07/24/2016    Pernicious anemia 07/24/2016   Carotid stenosis 02/23/2016   Coronary artery disease 03/29/2014   Valvular heart disease 01/05/2014   PVCs (premature ventricular contractions) 01/05/2014   Mixed hyperlipidemia 10/10/2011   Takotsubo cardiomyopathy 10/06/2011   Essential hypertension, benign 10/27/2010   Past Medical History:  Diagnosis Date   Arthritis    Cancer of kidney ([place])    Carotid artery disease ([place])    Chronic lower back pain    Coronary artery disease    Mild at cardiac catheterization 2013   Essential hypertension    History of blood transfusion 2008; 2010   Mitral valve prolapse    Mild mitral regurgitation   Mixed hyperlipidemia    Myocardial infarction ([place])    2013   Nocturia    Renal cancer ([place])    Status post right nephrectomy - 09/2006   Seizures ([place])    Childhood-88 yrs old "passed out"   Takotsubo cardiomyopathy    February 2013   Ventricular ectopy    Bigeminy    Family History  Problem Relation Age of Onset   Cancer Other    Stroke Other    Coronary artery disease Other    Hypertension Other    Hypertension Mother    Peripheral Artery Disease Mother        had leg bypass, and died after surgery; believed to be from throwing a clot   CVA Father    Prostate cancer Father    Anesthesia problems Neg Hx    Hypotension Neg Hx    Malignant hyperthermia Neg Hx    Pseudochol deficiency Neg Hx     Past Surgical History:  Procedure Laterality Date   APPENDECTOMY     BIOPSY  10/19/2019   Procedure: BIOPSY;  Surgeon: Danie Binder, MD;  [facility];  Service: Endoscopy;;   CARDIAC CATHETERIZATION     CAROTID ENDARTERECTOMY Left 02/23/2016   CAROTID ENDARTERECTOMY Right 06/11/2018   PATCH ANGIOPLASTY WITH XENOSURE PATCH   CATARACT EXTRACTION W/PHACO  03/22/2011   Procedure: CATARACT EXTRACTION PHACO AND INTRAOCULAR LENS PLACEMENT ([place]);  Surgeon: Tonny Branch;  Location[facility];  Service: Ophthalmology;  Laterality: Right;  CDE  19.88    CATARACT EXTRACTION W/PHACO  04/16/2011   Procedure: CATARACT EXTRACTION PHACO AND INTRAOCULAR LENS PLACEMENT ([facility]);  Surgeon: Tonny Branch;  Location: [facility];  Service: Ophthalmology;  Laterality: Left;  CDE 21.13   CHOLECYSTECTOMY  01/29/2012   Procedure:  LAPAROSCOPIC CHOLECYSTECTOMY WITH INTRAOPERATIVE CHOLANGIOGRAM;  Surgeon: Joyice Faster. Cornett, MD;  Location: [place];  Service: General;  Laterality: N/A;   ENDARTERECTOMY Left 02/23/2016   Procedure: LEFT CAROTID ENDARTERECTOMY WITH XENOSURE BOVINE PERICARDIUM PATCH ANGIOPLASTY;  Surgeon: Serafina Mitchell, MD;  Location: [place];  Service: Vascular;  Laterality: Left;   ENDARTERECTOMY Right 06/11/2018   Procedure: ENDARTERECTOMY CAROTID RIGHT;  Surgeon: Serafina Mitchell, MD;  [facility];  Service: Vascular;  Laterality: Right;   ESOPHAGOGASTRODUODENOSCOPY N/A 10/19/2019   one benign-appearing, intrinsic moderate stenosis s/p dilation. Moderate gastritis due to aspirin, mild duodenitis.    JOINT REPLACEMENT     KNEE ARTHROSCOPY Right    LITHOTRIPSY     NEPHRECTOMY Right 2008   [facility] ANGIOPLASTY Right 06/11/2018   Procedure: PATCH ANGIOPLASTY WITH Weyman Pedro;  Surgeon: Serafina Mitchell, MD;  Location: [facility] OR;  Service: Vascular;  Laterality: Right;   SAVORY DILATION N/A 10/19/2019   Procedure: SAVORY DILATION;  Surgeon: Danie Binder, MD;  [facility];  Service: Endoscopy;  Laterality: N/A;   TOTAL KNEE ARTHROPLASTY  2010   TOTAL KNEE ARTHROPLASTY Left 2014   Social History   Occupational History   Occupation: retired  Tobacco Use   Smoking status: Never    Passive exposure: Never   Smokeless tobacco: Never  Vaping Use   Vaping Use: Never used  Substance and Sexual Activity   Alcohol use: No    Alcohol/week: 0.0 standard drinks of alcohol   Drug use: No   Sexual activity: Yes    Birth control/protection: None

## 2022-08-22 DIAGNOSIS — I509 Heart failure, unspecified: Secondary | ICD-10-CM | POA: Diagnosis not present

## 2022-08-22 DIAGNOSIS — I10 Essential (primary) hypertension: Secondary | ICD-10-CM | POA: Diagnosis not present

## 2022-08-22 DIAGNOSIS — M549 Dorsalgia, unspecified: Secondary | ICD-10-CM | POA: Diagnosis not present

## 2022-08-22 DIAGNOSIS — Z299 Encounter for prophylactic measures, unspecified: Secondary | ICD-10-CM | POA: Diagnosis not present

## 2022-08-22 DIAGNOSIS — I429 Cardiomyopathy, unspecified: Secondary | ICD-10-CM | POA: Diagnosis not present

## 2022-09-21 DIAGNOSIS — M549 Dorsalgia, unspecified: Secondary | ICD-10-CM | POA: Diagnosis not present

## 2022-09-21 DIAGNOSIS — Z299 Encounter for prophylactic measures, unspecified: Secondary | ICD-10-CM | POA: Diagnosis not present

## 2022-09-21 DIAGNOSIS — I509 Heart failure, unspecified: Secondary | ICD-10-CM | POA: Diagnosis not present

## 2022-09-21 DIAGNOSIS — I10 Essential (primary) hypertension: Secondary | ICD-10-CM | POA: Diagnosis not present

## 2022-10-22 ENCOUNTER — Other Ambulatory Visit: Payer: Self-pay | Admitting: *Deleted

## 2022-10-22 DIAGNOSIS — I6523 Occlusion and stenosis of bilateral carotid arteries: Secondary | ICD-10-CM

## 2022-10-29 ENCOUNTER — Ambulatory Visit: Payer: Medicare Other | Admitting: Urology

## 2022-10-29 ENCOUNTER — Encounter: Payer: Self-pay | Admitting: Urology

## 2022-10-29 VITALS — BP 120/76 | HR 70

## 2022-10-29 DIAGNOSIS — R351 Nocturia: Secondary | ICD-10-CM

## 2022-10-29 DIAGNOSIS — R972 Elevated prostate specific antigen [PSA]: Secondary | ICD-10-CM | POA: Diagnosis not present

## 2022-10-29 NOTE — Progress Notes (Signed)
10/29/2022 2:36 PM   Darius Baxter [birth year] [id number]  Referring provider: Monico Blitz, MD [place],  [place] [address]  Followup elevated PSa and BPH   HPI: Darius Baxter is a 89yo here for followup for elevated PSA and nocturia. IPSS 6 QOl 1 on no BPh therapy. Nocturia 2x. Urine stream strong. No straining to urinate. No urinary frequency. He has occasional urinary hesitancy. No recent PSA.    PMH: Past Medical History:  Diagnosis Date   Arthritis    Cancer of kidney ([place])    Carotid artery disease ([place])    Chronic lower back pain    Coronary artery disease    Mild at cardiac catheterization 2013   Essential hypertension    History of blood transfusion 2008; 2010   Mitral valve prolapse    Mild mitral regurgitation   Mixed hyperlipidemia    Myocardial infarction ([place])    2013   Nocturia    Renal cancer ([place])    Status post right nephrectomy - 09/2006   Seizures ([place])    Childhood-89 yrs old "passed out"   Takotsubo cardiomyopathy    February 2013   Ventricular ectopy    Bigeminy    Surgical History: Past Surgical History:  Procedure Laterality Date   APPENDECTOMY     BIOPSY  10/19/2019   Procedure: BIOPSY;  Surgeon: Danie Binder, MD;  [facility];  Service: Endoscopy;;   CARDIAC CATHETERIZATION     CAROTID ENDARTERECTOMY Left 02/23/2016   CAROTID ENDARTERECTOMY Right 06/11/2018   PATCH ANGIOPLASTY WITH XENOSURE PATCH   CATARACT EXTRACTION W/PHACO  03/22/2011   Procedure: CATARACT EXTRACTION PHACO AND INTRAOCULAR LENS PLACEMENT ([place]);  Surgeon: Tonny Branch;  Location: [facility];  Service: Ophthalmology;  Laterality: Right;  CDE  19.88   CATARACT EXTRACTION W/PHACO  04/16/2011   Procedure: CATARACT EXTRACTION PHACO AND INTRAOCULAR LENS PLACEMENT (IOC);  Surgeon: Tonny Branch;  Location: [facility];  Service: Ophthalmology;  Laterality: Left;  CDE 21.13   CHOLECYSTECTOMY  01/29/2012   Procedure: LAPAROSCOPIC CHOLECYSTECTOMY WITH INTRAOPERATIVE  CHOLANGIOGRAM;  Surgeon: Darius Faster. Cornett, MD;  Location: [facility];  Service: General;  Laterality: N/A;   ENDARTERECTOMY Left 02/23/2016   Procedure: LEFT CAROTID ENDARTERECTOMY WITH XENOSURE BOVINE PERICARDIUM PATCH ANGIOPLASTY;  Surgeon: Serafina Mitchell, MD;  Location: [facility];  Service: Vascular;  Laterality: Left;   ENDARTERECTOMY Right 06/11/2018   Procedure: ENDARTERECTOMY CAROTID RIGHT;  Surgeon: Serafina Mitchell, MD;  [facility];  Service: Vascular;  Laterality: Right;   ESOPHAGOGASTRODUODENOSCOPY N/A 10/19/2019   one benign-appearing, intrinsic moderate stenosis s/p dilation. Moderate gastritis due to aspirin, mild duodenitis.    JOINT REPLACEMENT     KNEE ARTHROSCOPY Right    LITHOTRIPSY     NEPHRECTOMY Right 2008   [facility] ANGIOPLASTY Right 06/11/2018   Procedure: PATCH ANGIOPLASTY WITH Weyman Pedro;  Surgeon: Serafina Mitchell, MD;  Location: [facility] OR;  Service: Vascular;  Laterality: Right;   SAVORY DILATION N/A 10/19/2019   Procedure: SAVORY DILATION;  Surgeon: Danie Binder, MD;  [facility];  Service: Endoscopy;  Laterality: N/A;   TOTAL KNEE ARTHROPLASTY  2010   TOTAL KNEE ARTHROPLASTY Left 2014    Home Medications:  Allergies as of 10/29/2022       Reactions   Nsaids    Patient has only 1 kidney and Doctor  Doesn't want him to take   Vicodin [hydrocodone-acetaminophen] Nausea And Vomiting   IV only; no problems taking oral  tablet        Medication List        Accurate as of October 29, 2022  2:36 PM. If you have any questions, ask your nurse or doctor.          acetaminophen 500 MG tablet Commonly known as: TYLENOL Take 1,000 mg by mouth daily as needed for moderate pain.   amLODipine 5 MG tablet Commonly known as: NORVASC Take 5 mg by mouth daily.   amLODipine 5 MG tablet Commonly known as: NORVASC Take 5 mg by mouth at bedtime.   aspirin EC 81 MG tablet Take 1 tablet (81 mg total) by mouth daily.   atorvastatin 40 MG tablet Commonly  known as: LIPITOR Take 40 mg by mouth at bedtime.   carvedilol 6.25 MG tablet Commonly known as: COREG Take 3.125 mg by mouth 2 (two) times daily with a meal.   furosemide 20 MG tablet Commonly known as: LASIX Take 20 mg by mouth daily.   hydrALAZINE 50 MG tablet Commonly known as: APRESOLINE Take 50 mg by mouth 2 (two) times daily.   HYDROcodone-acetaminophen 7.5-325 MG tablet Commonly known as: NORCO Take 1 tablet by mouth as needed for moderate pain. Takes one tablet 2 times a week.   olmesartan 40 MG tablet Commonly known as: BENICAR Take 40 mg by mouth daily.   Vitamin D 50 MCG (2000 UT) tablet Take 5,000 Units by mouth daily.        Allergies:  Allergies  Allergen Reactions   Nsaids     Patient has only 1 kidney and Doctor  Doesn't want him to take   Vicodin [Hydrocodone-Acetaminophen] Nausea And Vomiting    IV only; no problems taking oral tablet    Family History: Family History  Problem Relation Age of Onset   Cancer Other    Stroke Other    Coronary artery disease Other    Hypertension Other    Hypertension Mother    Peripheral Artery Disease Mother        had leg bypass, and died after surgery; believed to be from throwing a clot   CVA Father    Prostate cancer Father    Anesthesia problems Neg Hx    Hypotension Neg Hx    Malignant hyperthermia Neg Hx    Pseudochol deficiency Neg Hx     Social History:  reports that he has never smoked. He has never been exposed to tobacco smoke. He has never used smokeless tobacco. He reports that he does not drink alcohol and does not use drugs.  ROS: All other review of systems were reviewed and are negative except what is noted above in HPI  Physical Exam: BP 120/76   Pulse 70   Constitutional:  Alert and oriented, No acute distress. HEENT: [place] AT, moist mucus membranes.  Trachea midline, no masses. Cardiovascular: No clubbing, cyanosis, or edema. Respiratory: Normal respiratory effort, no increased  work of breathing. GI: Abdomen is soft, nontender, nondistended, no abdominal masses GU: No CVA tenderness.  Lymph: No cervical or inguinal lymphadenopathy. Skin: No rashes, bruises or suspicious lesions. Neurologic: Grossly intact, no focal deficits, moving all 4 extremities. Psychiatric: Normal mood and affect.  Laboratory Data: Lab Results  Component Value Date   WBC 7.0 05/23/2019   HGB 15.2 05/23/2019   HCT 47.6 05/23/2019   MCV 97.3 05/23/2019   PLT 155 05/23/2019    Lab Results  Component Value Date   CREATININE 1.59 (H) 03/30/2021    No  results found for: "PSA"  No results found for: "TESTOSTERONE"  No results found for: "HGBA1C"  Urinalysis    Component Value Date/Time   COLORURINE YELLOW 06/05/2018 1102   APPEARANCEUR Clear 05/02/2022 1542   LABSPEC 1.009 06/05/2018 1102   PHURINE 5.0 06/05/2018 1102   GLUCOSEU Negative 05/02/2022 1542   HGBUR SMALL (A) 06/05/2018 1102   BILIRUBINUR Negative 05/02/2022 1542   KETONESUR NEGATIVE 06/05/2018 1102   PROTEINUR Negative 05/02/2022 1542   PROTEINUR NEGATIVE 06/05/2018 1102   NITRITE Positive (A) 05/02/2022 1542   NITRITE NEGATIVE 06/05/2018 1102   LEUKOCYTESUR 1+ (A) 05/02/2022 1542    Lab Results  Component Value Date   LABMICR See below: 05/02/2022   WBCUA 11-30 (A) 05/02/2022   LABEPIT 0-10 05/02/2022   MUCUS Present 05/02/2022   BACTERIA Many (A) 05/02/2022    Pertinent Imaging:  No results found for this or any previous visit.  No results found for this or any previous visit.  No results found for this or any previous visit.  No results found for this or any previous visit.  No results found for this or any previous visit.  No valid procedures specified. No results found for this or any previous visit.  No results found for this or any previous visit.   Assessment & Plan:    1. Elevated PSA PSA today. If stable he will followup in 6 months in 6 months with a PSA - Urinalysis,  Routine w reflex microscopic  2. Nocturia -patient defers therapy at this time.    No follow-ups on file.  Nicolette Bang, MD  [facility] [place]

## 2022-10-29 NOTE — Patient Instructions (Signed)

## 2022-10-30 LAB — MICROSCOPIC EXAMINATION

## 2022-10-30 LAB — URINALYSIS, ROUTINE W REFLEX MICROSCOPIC
Bilirubin, UA: NEGATIVE
Glucose, UA: NEGATIVE
Ketones, UA: NEGATIVE
Nitrite, UA: NEGATIVE
Protein,UA: NEGATIVE
RBC, UA: NEGATIVE
Specific Gravity, UA: 1.01 (ref 1.005–1.030)
Urobilinogen, Ur: 0.2 mg/dL (ref 0.2–1.0)
pH, UA: 5.5 (ref 5.0–7.5)

## 2022-11-05 ENCOUNTER — Ambulatory Visit: Payer: Medicare Other | Admitting: Physician Assistant

## 2022-11-05 ENCOUNTER — Ambulatory Visit (HOSPITAL_COMMUNITY)
Admission: RE | Admit: 2022-11-05 | Discharge: 2022-11-05 | Disposition: A | Discharge status: Home / Self Care | Payer: Medicare Other | Source: Ambulatory Visit | Attending: Surgery | Admitting: Surgery

## 2022-11-05 ENCOUNTER — Encounter: Payer: Self-pay | Admitting: Physician Assistant

## 2022-11-05 VITALS — BP 147/79 | HR 73 | Temp 98.1°F | Resp 16 | Ht 73.0 in | Wt 199.0 lb

## 2022-11-05 DIAGNOSIS — I6523 Occlusion and stenosis of bilateral carotid arteries: Secondary | ICD-10-CM | POA: Diagnosis not present

## 2022-11-05 NOTE — Progress Notes (Signed)
HISTORY AND PHYSICAL     CC:  follow up. Requesting Provider:  Monico Blitz, MD  HPI: This is a 89 y.o. male here for follow up for carotid artery stenosis.  Pt is s/p left CEA for symptomatic carotid artery stenosis on 02/23/2016 by Dr. Trula Slade.  He subsequently underwent right CEA on 06/11/2018 for asymptomatic carotid artery stenosis.    Pt was last seen 07/28/2021 and at that time he was not having any neurological sx, but did have some dizziness at times.  He did not have any claudication, rest pain or non healing wounds.  His ambulation was limited by bilateral knee pain that he wears braces for.   Pt returns today for follow up.    Pt denies any amaurosis fugax, speech difficulties, weakness, numbness, paralysis or clumsiness or facial droop.    He states he has some dizziness at times.    The pt is on a statin for cholesterol management.  The pt is on a daily aspirin.   Other AC:  none The pt is on CCB, diuretic, BB, ARB for hypertension.   The pt does not have diabetes Tobacco hx:  never  Pt does not have family hx of AAA.  Past Medical History:  Diagnosis Date   Arthritis    Cancer of kidney ([place])    Carotid artery disease ([place])    Chronic lower back pain    Coronary artery disease    Mild at cardiac catheterization 2013   Essential hypertension    History of blood transfusion 2008; 2010   Mitral valve prolapse    Mild mitral regurgitation   Mixed hyperlipidemia    Myocardial infarction ([place])    2013   Nocturia    Renal cancer ([place])    Status post right nephrectomy - 09/2006   Seizures ([place])    Childhood-89 yrs old "passed out"   Takotsubo cardiomyopathy    February 2013   Ventricular ectopy    Bigeminy    Past Surgical History:  Procedure Laterality Date   APPENDECTOMY     BIOPSY  10/19/2019   Procedure: BIOPSY;  Surgeon: Danie Binder, MD;  [facility];  Service: Endoscopy;;   CARDIAC CATHETERIZATION     CAROTID ENDARTERECTOMY Left  02/23/2016   CAROTID ENDARTERECTOMY Right 06/11/2018   PATCH ANGIOPLASTY WITH XENOSURE PATCH   CATARACT EXTRACTION W/PHACO  03/22/2011   Procedure: CATARACT EXTRACTION PHACO AND INTRAOCULAR LENS PLACEMENT ([place]);  Surgeon: Tonny Branch;  [facility];  Service: Ophthalmology;  Laterality: Right;  CDE  19.88   CATARACT EXTRACTION W/PHACO  04/16/2011   Procedure: CATARACT EXTRACTION PHACO AND INTRAOCULAR LENS PLACEMENT ([facility]);  Surgeon: Tonny Branch;  Location[facility];  Service: Ophthalmology;  Laterality: Left;  CDE 21.13   CHOLECYSTECTOMY  01/29/2012   Procedure: LAPAROSCOPIC CHOLECYSTECTOMY WITH INTRAOPERATIVE CHOLANGIOGRAM;  Surgeon: Joyice Faster. Cornett, MD;  Location: [facility];  Service: General;  Laterality: N/A;   ENDARTERECTOMY Left 02/23/2016   Procedure: LEFT CAROTID ENDARTERECTOMY WITH XENOSURE BOVINE PERICARDIUM PATCH ANGIOPLASTY;  Surgeon: Serafina Mitchell, MD;  Location: [facility];  Service: Vascular;  Laterality: Left;   ENDARTERECTOMY Right 06/11/2018   Procedure: ENDARTERECTOMY CAROTID RIGHT;  Surgeon: Serafina Mitchell, MD;  [facility];  Service: Vascular;  Laterality: Right;   ESOPHAGOGASTRODUODENOSCOPY N/A 10/19/2019   one benign-appearing, intrinsic moderate stenosis s/p dilation. Moderate gastritis due to aspirin, mild duodenitis.    JOINT REPLACEMENT     KNEE ARTHROSCOPY Right    LITHOTRIPSY  NEPHRECTOMY Right 2008   [facility] ANGIOPLASTY Right 06/11/2018   Procedure: PATCH ANGIOPLASTY WITH Weyman Pedro;  Surgeon: Serafina Mitchell, MD;  Location: [facility] OR;  Service: Vascular;  Laterality: Right;   SAVORY DILATION N/A 10/19/2019   Procedure: SAVORY DILATION;  Surgeon: Danie Binder, MD;  [facility];  Service: Endoscopy;  Laterality: N/A;   TOTAL KNEE ARTHROPLASTY  2010   TOTAL KNEE ARTHROPLASTY Left 2014    Allergies  Allergen Reactions   Nsaids     Patient has only 1 kidney and Doctor  Doesn't want him to take   Vicodin [Hydrocodone-Acetaminophen] Nausea And Vomiting     IV only; no problems taking oral tablet    Current Outpatient Medications  Medication Sig Dispense Refill   acetaminophen (TYLENOL) 500 MG tablet Take 1,000 mg by mouth daily as needed for moderate pain.     amLODipine (NORVASC) 5 MG tablet Take 5 mg by mouth at bedtime.     amLODipine (NORVASC) 5 MG tablet Take 5 mg by mouth daily.     aspirin EC 81 MG tablet Take 1 tablet (81 mg total) by mouth daily. 30 tablet 3   atorvastatin (LIPITOR) 40 MG tablet Take 40 mg by mouth at bedtime.      carvedilol (COREG) 6.25 MG tablet Take 3.125 mg by mouth 2 (two) times daily with a meal.     Cholecalciferol (VITAMIN D) 2000 units tablet Take 5,000 Units by mouth daily. (Patient not taking: Reported on 10/29/2022)     furosemide (LASIX) 20 MG tablet Take 20 mg by mouth daily.     hydrALAZINE (APRESOLINE) 50 MG tablet Take 50 mg by mouth 2 (two) times daily.     HYDROcodone-acetaminophen (NORCO) 7.5-325 MG tablet Take 1 tablet by mouth as needed for moderate pain. Takes one tablet 2 times a week. (Patient not taking: Reported on 10/29/2022)     olmesartan (BENICAR) 40 MG tablet Take 40 mg by mouth daily.     No current facility-administered medications for this visit.    Family History  Problem Relation Age of Onset   Cancer Other    Stroke Other    Coronary artery disease Other    Hypertension Other    Hypertension Mother    Peripheral Artery Disease Mother        had leg bypass, and died after surgery; believed to be from throwing a clot   CVA Father    Prostate cancer Father    Anesthesia problems Neg Hx    Hypotension Neg Hx    Malignant hyperthermia Neg Hx    Pseudochol deficiency Neg Hx     Social History   Socioeconomic History   Marital status: Married    Spouse name: Not on file   Number of children: Not on file   Years of education: Not on file   Highest education level: Not on file  Occupational History   Occupation: retired  Tobacco Use   Smoking status: Never    Passive  exposure: Never   Smokeless tobacco: Never  Vaping Use   Vaping Use: Never used  Substance and Sexual Activity   Alcohol use: No    Alcohol/week: 0.0 standard drinks of alcohol   Drug use: No   Sexual activity: Yes    Birth control/protection: None  Other Topics Concern   Not on file  Social History Narrative   Does not regularly exercise.    Social Determinants of Radio broadcast assistant  Strain: Not on file  Food Insecurity: Not on file  Transportation Needs: Not on file  Physical Activity: Not on file  Stress: Not on file  Social Connections: Not on file  Intimate Partner Violence: Not on file     REVIEW OF SYSTEMS:   [X]  denotes positive finding, [ ]  denotes negative finding Cardiac  Comments:  Chest pain or chest pressure:    Shortness of breath upon exertion:    Short of breath when lying flat:    Irregular heart rhythm:        Vascular    Pain in calf, thigh, or hip brought on by ambulation:    Pain in feet at night that wakes you up from your sleep:     Blood clot in your veins:    Leg swelling:         Pulmonary    Oxygen at home:    Productive cough:     Wheezing:         Neurologic    Sudden weakness in arms or legs:     Sudden numbness in arms or legs:     Sudden onset of difficulty speaking or slurred speech:    Temporary loss of vision in one eye:     Problems with dizziness:         Gastrointestinal    Blood in stool:     Vomited blood:         Genitourinary    Burning when urinating:     Blood in urine:        Psychiatric    Major depression:         Hematologic    Bleeding problems:    Problems with blood clotting too easily:        Skin    Rashes or ulcers:        Constitutional    Fever or chills:      PHYSICAL EXAMINATION:  Today's Vitals   11/05/22 1356 11/05/22 1359  BP: 126/80 (!) 147/79  Pulse: 73 73  Resp: 16   Temp: 98.1 F (36.7 C)   TempSrc: Temporal   SpO2: 98%   Weight: 199 lb (90.3 kg)   Height:  6\' 1"  (1.854 m)    Body mass index is 26.25 kg/m.   General:  WDWN in NAD; vital signs documented above Gait: Not observed HENT: WNL, normocephalic Pulmonary: normal non-labored breathing Cardiac: regular HR, without carotid bruits Abdomen: soft, NT; aortic pulse is not palpable Skin: without rashes Vascular Exam/Pulses:  Right Left  Radial 2+ (normal) 2+ (normal)  DP 2+ (normal) 2+ (normal)   Extremities: without open wounds Musculoskeletal: no muscle wasting or atrophy  Neurologic: A&O X 3; moving all extremities equally; speech is fluent/normal Psychiatric:  The pt has Normal affect.   Non-Invasive Vascular Imaging:   Carotid Duplex on 11/05/2022 Right:  1-39% ICA stenosis Left:  1-39% ICA stenosis Vertebrals:  Bilateral vertebral arteries demonstrate antegrade flow.  Subclavians: Normal flow hemodynamics were seen in bilateral subclavian arteries.   Previous Carotid duplex on 07/28/2021: Right: 1-39% ICA stenosis Left:   1-39% ICA stenosis    ASSESSMENT/PLAN:: 89 y.o. male here for follow up carotid artery stenosis and s/p left CEA for symptomatic carotid artery stenosis on 02/23/2016 by Dr. Trula Slade.  He subsequently underwent right CEA on 06/11/2018 for asymptomatic carotid artery stenosis.    -duplex today reveals bilateral carotid artery stenosis of 1-39%.  He does have some dizziness but this  is not related to his minimal carotid artery stenosis and subclavian arteries had normal flow dynamics.  -discussed s/s of stroke with pt and he understands should he develop any of these sx, he will go to the nearest ER or call 911. -pt will f/u in one year with carotid duplex -pt will call sooner should he have any issues. -continue statin/asa    Leontine Locket, [facility] [phone]  Clinic MD:  Trula Slade

## 2022-11-15 ENCOUNTER — Ambulatory Visit (INDEPENDENT_AMBULATORY_CARE_PROVIDER_SITE_OTHER): Payer: Medicare Other | Admitting: Orthopaedic Surgery

## 2022-11-15 ENCOUNTER — Encounter: Payer: Self-pay | Admitting: Orthopaedic Surgery

## 2022-11-15 VITALS — Ht 73.0 in | Wt 203.8 lb

## 2022-11-15 DIAGNOSIS — T8484XS Pain due to internal orthopedic prosthetic devices, implants and grafts, sequela: Secondary | ICD-10-CM

## 2022-11-15 DIAGNOSIS — Z96651 Presence of right artificial knee joint: Secondary | ICD-10-CM | POA: Diagnosis not present

## 2022-11-15 DIAGNOSIS — T8484XA Pain due to internal orthopedic prosthetic devices, implants and grafts, initial encounter: Secondary | ICD-10-CM | POA: Insufficient documentation

## 2022-11-15 NOTE — Progress Notes (Signed)
Office Visit Note   Patient: Darius Baxter           Date of Birth: 04/30/1934           MRN: [id number] Visit Date: 11/15/2022              Requested by: Darius Blitz, MD [address] [place],  [place] [address] PCP: Darius Blitz, MD   Assessment & Plan: Visit Diagnoses:  1. Pain due to total right knee replacement, sequela     Plan: Patient poly wear right knee.  He starts developing significant synovitis and pain he requires only revision.  We discussed not waiting so long that the locking mechanism might be worn.  He does well with the brace on.  He will return if he has ongoing problems and wants to make an appointment only if he needs to.  Follow-Up Instructions: Return if symptoms worsen or fail to improve.   Orders:  No orders of the defined types were placed in this encounter.  No orders of the defined types were placed in this encounter.     Procedures: No procedures performed   Clinical Data: No additional findings.   Subjective: Chief Complaint  Patient presents with   Right Knee - Pain, Follow-up   Left Knee - Follow-up, Pain    HPI 89 year old male with bilateral knee pain history of surgery by Dr. Amado Baxter with right knee done for left knee.  Uses braces over his knees occasionally hydrocodone when he goes to [employer] study since he has to stand has some AP instability right knee none on the left.  Braces help his knee still ambulates has some swelling in the right knee with activity.  He has some carotid stenosis hyperlipidemia valvular heart disease hypertension cardiomyopathy.  Review of Systems all systems noncontributory.   Objective: Vital Signs: Ht 6\' 1"  (1.854 m)   Wt 203 lb 12.8 oz (92.4 kg)   BMI 26.89 kg/m   Physical Exam Constitutional:      Appearance: He is well-developed.  HENT:     Head: Normocephalic and atraumatic.     Right Ear: External ear normal.     Left Ear: External ear normal.  Eyes:     Pupils: Pupils are equal, round,  and reactive to light.  Neck:     Thyroid: No thyromegaly.     Trachea: No tracheal deviation.  Cardiovascular:     Rate and Rhythm: Normal rate.  Pulmonary:     Effort: Pulmonary effort is normal.     Breath sounds: No wheezing.  Abdominal:     General: Bowel sounds are normal.     Palpations: Abdomen is soft.  Musculoskeletal:     Cervical back: Neck supple.  Skin:    General: Skin is warm and dry.     Capillary Refill: Capillary refill takes less than 2 seconds.  Neurological:     Mental Status: He is alert and oriented to person, place, and time.  Psychiatric:        Behavior: Behavior normal.        Thought Content: Thought content normal.        Judgment: Judgment normal.     Ortho Exam AP laxity right knee no AP laxity left knee.  Collateral ligaments are stable on the left mild laxity on the right.  Specialty Comments:  No specialty comments available.  Imaging: No results found.   PMFS History: Patient Active Problem List   Diagnosis Date Noted  Painful total knee replacement, right ([place]) 11/15/2022   GERD (gastroesophageal reflux disease) 02/12/2020   Constipation 02/12/2020   Dysphagia, idiopathic 10/15/2019   Asymptomatic carotid artery stenosis without infarction, right 06/11/2018   Primary malignant neoplasm of kidney with metastasis from kidney to other site [facility]) 09/24/2016   Bilateral impacted cerumen 08/07/2016   Chronic back pain 07/24/2016   Peripheral arterial disease ([place]) 07/24/2016   Pernicious anemia 07/24/2016   Carotid stenosis 02/23/2016   Coronary artery disease 03/29/2014   Valvular heart disease 01/05/2014   PVCs (premature ventricular contractions) 01/05/2014   Mixed hyperlipidemia 10/10/2011   Takotsubo cardiomyopathy 10/06/2011   Essential hypertension, benign 10/27/2010   Past Medical History:  Diagnosis Date   Arthritis    Cancer of kidney ([place])    Carotid artery disease ([place])    Chronic lower back pain    Coronary  artery disease    Mild at cardiac catheterization 2013   Essential hypertension    History of blood transfusion 2008; 2010   Mitral valve prolapse    Mild mitral regurgitation   Mixed hyperlipidemia    Myocardial infarction ([place])    2013   Nocturia    Renal cancer ([place])    Status post right nephrectomy - 09/2006   Seizures ([place])    Childhood-89 yrs old "passed out"   Takotsubo cardiomyopathy    February 2013   Ventricular ectopy    Bigeminy    Family History  Problem Relation Age of Onset   Cancer Other    Stroke Other    Coronary artery disease Other    Hypertension Other    Hypertension Mother    Peripheral Artery Disease Mother        had leg bypass, and died after surgery; believed to be from throwing a clot   CVA Father    Prostate cancer Father    Anesthesia problems Neg Hx    Hypotension Neg Hx    Malignant hyperthermia Neg Hx    Pseudochol deficiency Neg Hx     Past Surgical History:  Procedure Laterality Date   APPENDECTOMY     BIOPSY  10/19/2019   Procedure: BIOPSY;  Surgeon: Darius Binder, MD;  [facility];  Service: Endoscopy;;   CARDIAC CATHETERIZATION     CAROTID ENDARTERECTOMY Left 02/23/2016   CAROTID ENDARTERECTOMY Right 06/11/2018   PATCH ANGIOPLASTY WITH XENOSURE PATCH   CATARACT EXTRACTION W/PHACO  03/22/2011   Procedure: CATARACT EXTRACTION PHACO AND INTRAOCULAR LENS PLACEMENT ([place]);  Surgeon: Darius Baxter;  Location: [facility] ORS;  Service: Ophthalmology;  Laterality: Right;  CDE  19.88   CATARACT EXTRACTION W/PHACO  04/16/2011   Procedure: CATARACT EXTRACTION PHACO AND INTRAOCULAR LENS PLACEMENT ([facility]);  Surgeon: Darius Baxter;  Location: [facility];  Service: Ophthalmology;  Laterality: Left;  CDE 21.13   CHOLECYSTECTOMY  01/29/2012   Procedure: LAPAROSCOPIC CHOLECYSTECTOMY WITH INTRAOPERATIVE CHOLANGIOGRAM;  Surgeon: Darius Faster. Cornett, MD;  Location: [place];  Service: General;  Laterality: N/A;   ENDARTERECTOMY Left 02/23/2016   Procedure: LEFT CAROTID  ENDARTERECTOMY WITH XENOSURE BOVINE PERICARDIUM PATCH ANGIOPLASTY;  Surgeon: Darius Mitchell, MD;  Location: [place];  Service: Vascular;  Laterality: Left;   ENDARTERECTOMY Right 06/11/2018   Procedure: ENDARTERECTOMY CAROTID RIGHT;  Surgeon: Darius Mitchell, MD;  [facility];  Service: Vascular;  Laterality: Right;   ESOPHAGOGASTRODUODENOSCOPY N/A 10/19/2019   one benign-appearing, intrinsic moderate stenosis s/p dilation. Moderate gastritis due to aspirin, mild duodenitis.    JOINT REPLACEMENT  KNEE ARTHROSCOPY Right    LITHOTRIPSY     NEPHRECTOMY Right 2008   [facility] ANGIOPLASTY Right 06/11/2018   Procedure: PATCH ANGIOPLASTY WITH Weyman Pedro;  Surgeon: Darius Mitchell, MD;  [facility];  Service: Vascular;  Laterality: Right;   SAVORY DILATION N/A 10/19/2019   Procedure: SAVORY DILATION;  Surgeon: Darius Binder, MD;  [facility];  Service: Endoscopy;  Laterality: N/A;   TOTAL KNEE ARTHROPLASTY  2010   TOTAL KNEE ARTHROPLASTY Left 2014   Social History   Occupational History   Occupation: retired  Tobacco Use   Smoking status: Never    Passive exposure: Never   Smokeless tobacco: Never  Vaping Use   Vaping Use: Never used  Substance and Sexual Activity   Alcohol use: No    Alcohol/week: 0.0 standard drinks of alcohol   Drug use: No   Sexual activity: Yes    Birth control/protection: None

## 2022-11-19 DIAGNOSIS — H02834 Dermatochalasis of left upper eyelid: Secondary | ICD-10-CM | POA: Diagnosis not present

## 2022-11-19 DIAGNOSIS — H401111 Primary open-angle glaucoma, right eye, mild stage: Secondary | ICD-10-CM | POA: Diagnosis not present

## 2022-11-19 DIAGNOSIS — H02831 Dermatochalasis of right upper eyelid: Secondary | ICD-10-CM | POA: Diagnosis not present

## 2022-11-19 DIAGNOSIS — H01001 Unspecified blepharitis right upper eyelid: Secondary | ICD-10-CM | POA: Diagnosis not present

## 2023-01-22 ENCOUNTER — Ambulatory Visit: Payer: Medicare Other | Attending: Cardiology | Admitting: Cardiology

## 2023-01-22 ENCOUNTER — Encounter: Payer: Self-pay | Admitting: Cardiology

## 2023-01-22 VITALS — BP 138/72 | HR 61 | Ht 73.0 in | Wt 199.0 lb

## 2023-01-22 DIAGNOSIS — Z8679 Personal history of other diseases of the circulatory system: Secondary | ICD-10-CM | POA: Diagnosis not present

## 2023-01-22 DIAGNOSIS — I10 Essential (primary) hypertension: Secondary | ICD-10-CM

## 2023-01-22 DIAGNOSIS — E782 Mixed hyperlipidemia: Secondary | ICD-10-CM | POA: Diagnosis not present

## 2023-01-22 DIAGNOSIS — I340 Nonrheumatic mitral (valve) insufficiency: Secondary | ICD-10-CM

## 2023-01-22 NOTE — Patient Instructions (Addendum)

## 2023-01-22 NOTE — Progress Notes (Signed)
    Cardiology Office Note  Date: 01/22/2023   ID: KASSIM FRISQUE, DOB [birth year], MRN [id number]  History of Present Illness: Darius Baxter is an 89 y.o. male last seen in November 2023.  He is here today for follow-up visit.  Continues to do well from a cardiac perspective, reports no angina and stable NYHA class II dyspnea with typical activities.  He is limited by arthritic pains, but remains as active as he can.  I reviewed his medications, he reports no intolerances.  Lab work pending with PCP later this week.  His LDL was 41 in June of last year.  ECG today shows sinus bradycardia.  Physical Exam: VS:  BP 138/72   Pulse 61   Ht 6\' 1"  (1.854 m)   Wt 199 lb (90.3 kg)   SpO2 96%   BMI 26.25 kg/m , BMI Body mass index is 26.25 kg/m.  Wt Readings from Last 3 Encounters:  01/22/23 199 lb (90.3 kg)  11/15/22 203 lb 12.8 oz (92.4 kg)  11/05/22 199 lb (90.3 kg)    General: Patient appears comfortable at rest. HEENT: Conjunctiva and lids normal. Neck: Supple, no elevated JVP or carotid bruits. Lungs: Clear to auscultation, nonlabored breathing at rest. Cardiac: Regular rate and rhythm, no S3 or significant systolic murmur. Extremities: No pitting edema.  ECG:  An ECG dated 01/02/2022 was personally reviewed today and demonstrated:  Sinus rhythm.  Labwork:  June 2023: Cholesterol 107, triglycerides 132, HDL 43, LDL 41, BUN 17, creatinine 0.99, potassium 4.4, AST 13, ALT 8, TSH 2.56, hemoglobin 14.3, platelets 142  Other Studies Reviewed Today:  Carotid Dopplers 11/05/2022: Summary:  Right Carotid: Velocities in the right ICA are consistent with a 1-39%  stenosis.                The extracranial vessels were near-normal with only minimal  wall                thickening or plaque.   Left Carotid: Velocities in the left ICA are consistent with a 1-39%  stenosis.               The extracranial vessels were near-normal with only minimal  wall               thickening or  plaque.   Vertebrals:  Bilateral vertebral arteries demonstrate antegrade flow.  Subclavians: Normal flow hemodynamics were seen in bilateral subclavian               arteries.   Assessment and Plan:  1.  HFrecEF with history of stress-induced cardiomyopathy, LVEF 65 to 70% by echocardiogram in August 2022.  He remains clinically stable with plan to continue medical therapy and observation at this point.  No change in current regimen which includes Coreg and Benicar.  2.  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  No significant murmur on examination today.  3.  Essential hypertension.  He is also on Norvasc and hydralazine with systolic in the 130s, no changes were made today.  4.  Mixed hyperlipidemia, LDL 41 in June 2023.  Continue Lipitor.  He has follow-up lab work with PCP soon.  Disposition:  Follow up  1 year, sooner if needed.  Signed, Jonelle Sidle, M.D., F.A.C.C. [facility]

## 2023-01-25 DIAGNOSIS — Z7189 Other specified counseling: Secondary | ICD-10-CM | POA: Diagnosis not present

## 2023-01-25 DIAGNOSIS — E7800 Pure hypercholesterolemia, unspecified: Secondary | ICD-10-CM | POA: Diagnosis not present

## 2023-01-25 DIAGNOSIS — Z299 Encounter for prophylactic measures, unspecified: Secondary | ICD-10-CM | POA: Diagnosis not present

## 2023-01-25 DIAGNOSIS — I509 Heart failure, unspecified: Secondary | ICD-10-CM | POA: Diagnosis not present

## 2023-01-25 DIAGNOSIS — I700 Atherosclerosis of aorta: Secondary | ICD-10-CM | POA: Diagnosis not present

## 2023-01-25 DIAGNOSIS — Z0000 Encounter for general adult medical examination without abnormal findings: Secondary | ICD-10-CM | POA: Diagnosis not present

## 2023-01-25 DIAGNOSIS — I429 Cardiomyopathy, unspecified: Secondary | ICD-10-CM | POA: Diagnosis not present

## 2023-03-29 DIAGNOSIS — I429 Cardiomyopathy, unspecified: Secondary | ICD-10-CM | POA: Diagnosis not present

## 2023-03-29 DIAGNOSIS — L723 Sebaceous cyst: Secondary | ICD-10-CM | POA: Diagnosis not present

## 2023-03-29 DIAGNOSIS — I509 Heart failure, unspecified: Secondary | ICD-10-CM | POA: Diagnosis not present

## 2023-03-29 DIAGNOSIS — Z299 Encounter for prophylactic measures, unspecified: Secondary | ICD-10-CM | POA: Diagnosis not present

## 2023-03-29 DIAGNOSIS — I700 Atherosclerosis of aorta: Secondary | ICD-10-CM | POA: Diagnosis not present

## 2023-03-29 DIAGNOSIS — C649 Malignant neoplasm of unspecified kidney, except renal pelvis: Secondary | ICD-10-CM | POA: Diagnosis not present

## 2023-03-29 DIAGNOSIS — I10 Essential (primary) hypertension: Secondary | ICD-10-CM | POA: Diagnosis not present

## 2023-04-12 DIAGNOSIS — I10 Essential (primary) hypertension: Secondary | ICD-10-CM | POA: Diagnosis not present

## 2023-04-12 DIAGNOSIS — Z299 Encounter for prophylactic measures, unspecified: Secondary | ICD-10-CM | POA: Diagnosis not present

## 2023-04-12 DIAGNOSIS — L723 Sebaceous cyst: Secondary | ICD-10-CM | POA: Diagnosis not present

## 2023-04-12 DIAGNOSIS — I509 Heart failure, unspecified: Secondary | ICD-10-CM | POA: Diagnosis not present

## 2023-04-23 ENCOUNTER — Other Ambulatory Visit: Payer: Medicare Other

## 2023-04-23 DIAGNOSIS — R972 Elevated prostate specific antigen [PSA]: Secondary | ICD-10-CM

## 2023-04-24 LAB — PSA: Prostate Specific Ag, Serum: 9.5 ng/mL — ABNORMAL HIGH (ref 0.0–4.0)

## 2023-04-29 ENCOUNTER — Ambulatory Visit: Payer: Medicare Other | Admitting: Urology

## 2023-04-29 VITALS — BP 168/84 | HR 63

## 2023-04-29 DIAGNOSIS — R351 Nocturia: Secondary | ICD-10-CM | POA: Diagnosis not present

## 2023-04-29 DIAGNOSIS — R972 Elevated prostate specific antigen [PSA]: Secondary | ICD-10-CM | POA: Diagnosis not present

## 2023-04-29 MED ORDER — FINASTERIDE 5 MG PO TABS: 5.0000 mg | ORAL_TABLET | Freq: Every day | ORAL | 3 refills | Status: DC

## 2023-04-29 NOTE — Progress Notes (Unsigned)
04/29/2023 2:38 PM   Darius Baxter 1934-04-28 [id number]  Referring provider: Kirstie Peri, MD [address] [place],  [place] [address]  Followup elevated PSA   HPI: Mr Stansbery is a 89yo here for followup for elevated PSA. PSA increased to 9.5 from 8.2. No worsening LUTS. IPSS 10 QOL 2 on no BPH therapy. Nocturia 2-3x. No straining to urinate. No other complaints today,    PMH: Past Medical History:  Diagnosis Date   Arthritis    Cancer of kidney ([facility])    Carotid artery disease ([facility])    Chronic lower back pain    Coronary artery disease    Mild at cardiac catheterization 2013   Essential hypertension    History of blood transfusion 2008; 2010   Mitral valve prolapse    Mild mitral regurgitation   Mixed hyperlipidemia    Myocardial infarction ([facility])    2013   Nocturia    Renal cancer ([facility])    Status post right nephrectomy - 09/2006   Seizures ([facility])    Childhood-89 yrs old "passed out"   Takotsubo cardiomyopathy    February 2013   Ventricular ectopy    Bigeminy    Surgical History: Past Surgical History:  Procedure Laterality Date   APPENDECTOMY     BIOPSY  10/19/2019   Procedure: BIOPSY;  Surgeon: West Bali, MD;  [facility];  Service: Endoscopy;;   CARDIAC CATHETERIZATION     CAROTID ENDARTERECTOMY Left 02/23/2016   CAROTID ENDARTERECTOMY Right 06/11/2018   PATCH ANGIOPLASTY WITH XENOSURE PATCH   CATARACT EXTRACTION W/PHACO  03/22/2011   Procedure: CATARACT EXTRACTION PHACO AND INTRAOCULAR LENS PLACEMENT ([facility]);  Surgeon: Gemma Payor;  Location: [facility];  Service: Ophthalmology;  Laterality: Right;  CDE  19.88   CATARACT EXTRACTION W/PHACO  04/16/2011   Procedure: CATARACT EXTRACTION PHACO AND INTRAOCULAR LENS PLACEMENT ([facility]);  Surgeon: Gemma Payor;  Location: [facility];  Service: Ophthalmology;  Laterality: Left;  CDE 21.13   CHOLECYSTECTOMY  01/29/2012   Procedure: LAPAROSCOPIC CHOLECYSTECTOMY WITH INTRAOPERATIVE CHOLANGIOGRAM;  Surgeon: Clovis Pu. Cornett,  MD;  [facility];  Service: General;  Laterality: N/A;   ENDARTERECTOMY Left 02/23/2016   Procedure: LEFT CAROTID ENDARTERECTOMY WITH XENOSURE BOVINE PERICARDIUM PATCH ANGIOPLASTY;  Surgeon: Nada Libman, MD;  [facility];  Service: Vascular;  Laterality: Left;   ENDARTERECTOMY Right 06/11/2018   Procedure: ENDARTERECTOMY CAROTID RIGHT;  Surgeon: Nada Libman, MD;  [facility];  Service: Vascular;  Laterality: Right;   ESOPHAGOGASTRODUODENOSCOPY N/A 10/19/2019   one benign-appearing, intrinsic moderate stenosis s/p dilation. Moderate gastritis due to aspirin, mild duodenitis.    JOINT REPLACEMENT     KNEE ARTHROSCOPY Right    LITHOTRIPSY     NEPHRECTOMY Right 2008   [facility] ANGIOPLASTY Right 06/11/2018   Procedure: PATCH ANGIOPLASTY WITH Kathleen Lime;  Surgeon: Nada Libman, MD;  Location: [facility] OR;  Service: Vascular;  Laterality: Right;   SAVORY DILATION N/A 10/19/2019   Procedure: SAVORY DILATION;  Surgeon: West Bali, MD;  [facility];  Service: Endoscopy;  Laterality: N/A;   TOTAL KNEE ARTHROPLASTY  2010   TOTAL KNEE ARTHROPLASTY Left 2014    Home Medications:  Allergies as of 04/29/2023       Reactions   Nsaids    Patient has only 1 kidney and Doctor  Doesn't want him to take   Vicodin [hydrocodone-acetaminophen] Nausea And Vomiting   IV only; no problems taking oral tablet  Medication List        Accurate as of April 29, 2023  2:38 PM. If you have any questions, ask your nurse or doctor.          acetaminophen 500 MG tablet Commonly known as: TYLENOL Take 1,000 mg by mouth daily as needed for moderate pain.   amLODipine 5 MG tablet Commonly known as: NORVASC Take 5 mg by mouth at bedtime.   aspirin EC 81 MG tablet Take 1 tablet (81 mg total) by mouth daily.   atorvastatin 40 MG tablet Commonly known as: LIPITOR Take 40 mg by mouth at bedtime.   carvedilol 6.25 MG tablet Commonly known as: COREG Take 6.25 mg by  mouth 2 (two) times daily with a meal.   furosemide 20 MG tablet Commonly known as: LASIX Take 20 mg by mouth daily.   hydrALAZINE 50 MG tablet Commonly known as: APRESOLINE Take 50 mg by mouth 2 (two) times daily.   HYDROcodone-acetaminophen 7.5-325 MG tablet Commonly known as: NORCO Take 1-2 tablets by mouth as needed for moderate pain. Takes one tablet 2 times a week.   olmesartan 40 MG tablet Commonly known as: BENICAR Take 40 mg by mouth daily.        Allergies:  Allergies  Allergen Reactions   Nsaids     Patient has only 1 kidney and Doctor  Doesn't want him to take   Vicodin [Hydrocodone-Acetaminophen] Nausea And Vomiting    IV only; no problems taking oral tablet    Family History: Family History  Problem Relation Age of Onset   Cancer Other    Stroke Other    Coronary artery disease Other    Hypertension Other    Hypertension Mother    Peripheral Artery Disease Mother        had leg bypass, and died after surgery; believed to be from throwing a clot   CVA Father    Prostate cancer Father    Anesthesia problems Neg Hx    Hypotension Neg Hx    Malignant hyperthermia Neg Hx    Pseudochol deficiency Neg Hx     Social History:  reports that he has never smoked. He has never been exposed to tobacco smoke. He has never used smokeless tobacco. He reports that he does not drink alcohol and does not use drugs.  ROS: All other review of systems were reviewed and are negative except what is noted above in HPI  Physical Exam: BP (!) 168/84   Pulse 63   Constitutional:  Alert and oriented, No acute distress. HEENT: [place] AT, moist mucus membranes.  Trachea midline, no masses. Cardiovascular: No clubbing, cyanosis, or edema. Respiratory: Normal respiratory effort, no increased work of breathing. GI: Abdomen is soft, nontender, nondistended, no abdominal masses GU: No CVA tenderness.  Lymph: No cervical or inguinal lymphadenopathy. Skin: No rashes, bruises or  suspicious lesions. Neurologic: Grossly intact, no focal deficits, moving all 4 extremities. Psychiatric: Normal mood and affect.  Laboratory Data: Lab Results  Component Value Date   WBC 7.0 05/23/2019   HGB 15.2 05/23/2019   HCT 47.6 05/23/2019   MCV 97.3 05/23/2019   PLT 155 05/23/2019    Lab Results  Component Value Date   CREATININE 1.59 (H) 03/30/2021    No results found for: "PSA"  No results found for: "TESTOSTERONE"  No results found for: "HGBA1C"  Urinalysis    Component Value Date/Time   COLORURINE YELLOW 06/05/2018 1102   APPEARANCEUR Clear 10/29/2022 1422  LABSPEC 1.009 06/05/2018 1102   PHURINE 5.0 06/05/2018 1102   GLUCOSEU Negative 10/29/2022 1422   HGBUR SMALL (A) 06/05/2018 1102   BILIRUBINUR Negative 10/29/2022 1422   KETONESUR NEGATIVE 06/05/2018 1102   PROTEINUR Negative 10/29/2022 1422   PROTEINUR NEGATIVE 06/05/2018 1102   NITRITE Negative 10/29/2022 1422   NITRITE NEGATIVE 06/05/2018 1102   LEUKOCYTESUR Trace (A) 10/29/2022 1422    Lab Results  Component Value Date   LABMICR See below: 10/29/2022   WBCUA 6-10 (A) 10/29/2022   LABEPIT 0-10 10/29/2022   MUCUS Present 05/02/2022   BACTERIA Moderate (A) 10/29/2022    Pertinent Imaging:  No results found for this or any previous visit.  No results found for this or any previous visit.  No results found for this or any previous visit.  No results found for this or any previous visit.  No results found for this or any previous visit.  No valid procedures specified. No results found for this or any previous visit.  No results found for this or any previous visit.   Assessment & Plan:    1. Elevated PSA -followup 3 months with PSA - Urinalysis, Routine w reflex microscopic  2. Nocturia -we will start finasteride 5mg  daily   No follow-ups on file.  Wilkie Aye, MD  [facility] [place]

## 2023-04-30 ENCOUNTER — Encounter: Payer: Self-pay | Admitting: Urology

## 2023-04-30 LAB — URINALYSIS, ROUTINE W REFLEX MICROSCOPIC
Bilirubin, UA: NEGATIVE
Glucose, UA: NEGATIVE
Ketones, UA: NEGATIVE
Nitrite, UA: POSITIVE — AB
Protein,UA: NEGATIVE
RBC, UA: NEGATIVE
Specific Gravity, UA: 1.02 (ref 1.005–1.030)
Urobilinogen, Ur: 0.2 mg/dL (ref 0.2–1.0)
pH, UA: 5.5 (ref 5.0–7.5)

## 2023-04-30 LAB — MICROSCOPIC EXAMINATION

## 2023-04-30 NOTE — Patient Instructions (Signed)

## 2023-05-03 LAB — URINE CULTURE

## 2023-05-07 ENCOUNTER — Telehealth: Payer: Self-pay

## 2023-05-07 MED ORDER — NITROFURANTOIN MONOHYD MACRO 100 MG PO CAPS: 100.0000 mg | ORAL_CAPSULE | Freq: Two times a day (BID) | ORAL | 0 refills | Status: AC

## 2023-05-07 NOTE — Telephone Encounter (Signed)
Patient called and made aware of positive urine culture and Macrobid sent to pharmacy per MD.

## 2023-05-07 NOTE — Telephone Encounter (Signed)
-----   Message from Wilkie Aye sent at 05/07/2023  8:05 AM EDT ----- Macrobid 100mg  BID for 7 days ----- Message ----- From: Gustavus Messing, LPN Sent: 04/17/5620   1:43 PM EDT To: Malen Gauze, MD  No treatment started

## 2023-05-27 DIAGNOSIS — H02831 Dermatochalasis of right upper eyelid: Secondary | ICD-10-CM | POA: Diagnosis not present

## 2023-05-27 DIAGNOSIS — H01001 Unspecified blepharitis right upper eyelid: Secondary | ICD-10-CM | POA: Diagnosis not present

## 2023-05-27 DIAGNOSIS — H401111 Primary open-angle glaucoma, right eye, mild stage: Secondary | ICD-10-CM | POA: Diagnosis not present

## 2023-05-27 DIAGNOSIS — H02834 Dermatochalasis of left upper eyelid: Secondary | ICD-10-CM | POA: Diagnosis not present

## 2023-05-31 DIAGNOSIS — Z299 Encounter for prophylactic measures, unspecified: Secondary | ICD-10-CM | POA: Diagnosis not present

## 2023-05-31 DIAGNOSIS — Z23 Encounter for immunization: Secondary | ICD-10-CM | POA: Diagnosis not present

## 2023-05-31 DIAGNOSIS — I700 Atherosclerosis of aorta: Secondary | ICD-10-CM | POA: Diagnosis not present

## 2023-05-31 DIAGNOSIS — M48061 Spinal stenosis, lumbar region without neurogenic claudication: Secondary | ICD-10-CM | POA: Diagnosis not present

## 2023-05-31 DIAGNOSIS — C649 Malignant neoplasm of unspecified kidney, except renal pelvis: Secondary | ICD-10-CM | POA: Diagnosis not present

## 2023-05-31 DIAGNOSIS — Z0000 Encounter for general adult medical examination without abnormal findings: Secondary | ICD-10-CM | POA: Diagnosis not present

## 2023-05-31 DIAGNOSIS — I10 Essential (primary) hypertension: Secondary | ICD-10-CM | POA: Diagnosis not present

## 2023-08-12 ENCOUNTER — Other Ambulatory Visit: Payer: Medicare Other

## 2023-08-26 ENCOUNTER — Other Ambulatory Visit: Payer: Medicare Other

## 2023-08-26 DIAGNOSIS — R972 Elevated prostate specific antigen [PSA]: Secondary | ICD-10-CM

## 2023-08-27 LAB — PSA: Prostate Specific Ag, Serum: 4.6 ng/mL — ABNORMAL HIGH (ref 0.0–4.0)

## 2023-08-28 ENCOUNTER — Ambulatory Visit: Payer: Medicare Other | Admitting: Urology

## 2023-08-28 VITALS — BP 175/73 | HR 61

## 2023-08-28 DIAGNOSIS — R972 Elevated prostate specific antigen [PSA]: Secondary | ICD-10-CM | POA: Diagnosis not present

## 2023-08-28 DIAGNOSIS — R351 Nocturia: Secondary | ICD-10-CM | POA: Diagnosis not present

## 2023-08-28 MED ORDER — FINASTERIDE 5 MG PO TABS
5.0000 mg | ORAL_TABLET | Freq: Every day | ORAL | 3 refills | Status: DC
Start: 2023-08-28 — End: 2024-03-02

## 2023-08-28 NOTE — Progress Notes (Signed)
 08/28/2023 1:37 PM   Darius Baxter 04-30-1934 [id number]  Referring provider: Maree Isles, MD [address] [place],  [place] [address]  Followup elevated PSA   HPI: Darius Baxter is a 89yo here for followup for elevated PSA and Nocturia. PSA decreased to 4.6 on finasteride  IPSS 6 QOL 2 on finasteride ,. Urine stream strong. No straining to urinate. Nocturia 2-3x. No other complaints today   PMH: Past Medical History:  Diagnosis Date   Arthritis    Cancer of kidney (HCC)    Carotid artery disease (HCC)    Chronic lower back pain    Coronary artery disease    Mild at cardiac catheterization 2013   Essential hypertension    History of blood transfusion 2008; 2010   Mitral valve prolapse    Mild mitral regurgitation   Mixed hyperlipidemia    Myocardial infarction (HCC)    2013   Nocturia    Renal cancer (HCC)    Status post right nephrectomy - 09/2006   Seizures (HCC)    Childhood-89 yrs old passed out   Takotsubo cardiomyopathy    February 2013   Ventricular ectopy    Bigeminy    Surgical History: Past Surgical History:  Procedure Laterality Date   APPENDECTOMY     BIOPSY  10/19/2019   Procedure: BIOPSY;  Surgeon: Harvey Margo CROME, MD;  [facility];  Service: Endoscopy;;   CARDIAC CATHETERIZATION     CAROTID ENDARTERECTOMY Left 02/23/2016   CAROTID ENDARTERECTOMY Right 06/11/2018   PATCH ANGIOPLASTY WITH XENOSURE PATCH   CATARACT EXTRACTION W/PHACO  03/22/2011   Procedure: CATARACT EXTRACTION PHACO AND INTRAOCULAR LENS PLACEMENT (IOC);  Surgeon: Cherene Mania;  Location: [facility];  Service: Ophthalmology;  Laterality: Right;  CDE  19.88   CATARACT EXTRACTION W/PHACO  04/16/2011   Procedure: CATARACT EXTRACTION PHACO AND INTRAOCULAR LENS PLACEMENT (IOC);  Surgeon: Cherene Mania;  Location: [facility];  Service: Ophthalmology;  Laterality: Left;  CDE 21.13   CHOLECYSTECTOMY  01/29/2012   Procedure: LAPAROSCOPIC CHOLECYSTECTOMY WITH INTRAOPERATIVE CHOLANGIOGRAM;  Surgeon: Debby LABOR. Cornett, MD;  [facility]: [facility];  Service: General;  Laterality: N/A;   ENDARTERECTOMY Left 02/23/2016   Procedure: LEFT CAROTID ENDARTERECTOMY WITH XENOSURE BOVINE PERICARDIUM PATCH ANGIOPLASTY;  Surgeon: Gaile LELON New, MD;  Location: [facility];  Service: Vascular;  Laterality: Left;   ENDARTERECTOMY Right 06/11/2018   Procedure: ENDARTERECTOMY CAROTID RIGHT;  Surgeon: New Gaile LELON, MD;  Location: [facility];  Service: Vascular;  Laterality: Right;   ESOPHAGOGASTRODUODENOSCOPY N/A 10/19/2019   one benign-appearing, intrinsic moderate stenosis s/p dilation. Moderate gastritis due to aspirin , mild duodenitis.    JOINT REPLACEMENT     KNEE ARTHROSCOPY Right    LITHOTRIPSY     NEPHRECTOMY Right 2008   [facility] ANGIOPLASTY Right 06/11/2018   Procedure: PATCH ANGIOPLASTY WITH GEORGE LISLE;  Surgeon: New Gaile LELON, MD;  Location: [facility] OR;  Service: Vascular;  Laterality: Right;   SAVORY DILATION N/A 10/19/2019   Procedure: SAVORY DILATION;  Surgeon: Harvey Margo CROME, MD;  [facility];  Service: Endoscopy;  Laterality: N/A;   TOTAL KNEE ARTHROPLASTY  2010   TOTAL KNEE ARTHROPLASTY Left 2014    Home Medications:  Allergies as of 08/28/2023       Reactions   Nsaids    Patient has only 1 kidney and Doctor  Doesn't want him to take   Vicodin [hydrocodone -acetaminophen ] Nausea And Vomiting   IV only; no problems taking oral tablet  Medication List        Accurate as of August 28, 2023  1:37 PM. If you have any questions, ask your nurse or doctor.          acetaminophen  500 MG tablet Commonly known as: TYLENOL  Take 1,000 mg by mouth daily as needed for moderate pain.   amLODipine  5 MG tablet Commonly known as: NORVASC  Take 5 mg by mouth at bedtime.   aspirin  EC 81 MG tablet Take 1 tablet (81 mg total) by mouth daily.   atorvastatin  40 MG tablet Commonly known as: LIPITOR Take 40 mg by mouth at bedtime.   carvedilol  6.25 MG tablet Commonly known as: COREG  Take 6.25  mg by mouth 2 (two) times daily with a meal.   finasteride  5 MG tablet Commonly known as: PROSCAR  Take 1 tablet (5 mg total) by mouth daily.   furosemide  20 MG tablet Commonly known as: LASIX  Take 20 mg by mouth daily.   hydrALAZINE  50 MG tablet Commonly known as: APRESOLINE  Take 50 mg by mouth 2 (two) times daily.   HYDROcodone -acetaminophen  7.5-325 MG tablet Commonly known as: NORCO Take 1-2 tablets by mouth as needed for moderate pain. Takes one tablet 2 times a week.   nitrofurantoin  (macrocrystal-monohydrate) 100 MG capsule Commonly known as: MACROBID  Take 1 capsule (100 mg total) by mouth 2 (two) times daily.   olmesartan  40 MG tablet Commonly known as: BENICAR  Take 40 mg by mouth daily.        Allergies:  Allergies  Allergen Reactions   Nsaids     Patient has only 1 kidney and Doctor  Doesn't want him to take   Vicodin [Hydrocodone -Acetaminophen ] Nausea And Vomiting    IV only; no problems taking oral tablet    Family History: Family History  Problem Relation Age of Onset   Cancer Other    Stroke Other    Coronary artery disease Other    Hypertension Other    Hypertension Mother    Peripheral Artery Disease Mother        had leg bypass, and died after surgery; believed to be from throwing a clot   CVA Father    Prostate cancer Father    Anesthesia problems Neg Hx    Hypotension Neg Hx    Malignant hyperthermia Neg Hx    Pseudochol deficiency Neg Hx     Social History:  reports that he has never smoked. He has never been exposed to tobacco smoke. He has never used smokeless tobacco. He reports that he does not drink alcohol and does not use drugs.  ROS: All other review of systems were reviewed and are negative except what is noted above in HPI  Physical Exam: BP (!) 175/73   Pulse 61   Constitutional:  Alert and oriented, No acute distress. HEENT: [place] AT, moist mucus membranes.  Trachea midline, no masses. Cardiovascular: No clubbing, cyanosis,  or edema. Respiratory: Normal respiratory effort, no increased work of breathing. GI: Abdomen is soft, nontender, nondistended, no abdominal masses GU: No CVA tenderness.  Lymph: No cervical or inguinal lymphadenopathy. Skin: No rashes, bruises or suspicious lesions. Neurologic: Grossly intact, no focal deficits, moving all 4 extremities. Psychiatric: Normal mood and affect.  Laboratory Data: Lab Results  Component Value Date   WBC 7.0 05/23/2019   HGB 15.2 05/23/2019   HCT 47.6 05/23/2019   MCV 97.3 05/23/2019   PLT 155 05/23/2019    Lab Results  Component Value Date   CREATININE 1.59 (H) 03/30/2021  No results found for: PSA  No results found for: TESTOSTERONE  No results found for: HGBA1C  Urinalysis    Component Value Date/Time   COLORURINE YELLOW 06/05/2018 1102   APPEARANCEUR Clear 04/29/2023 1400   LABSPEC 1.009 06/05/2018 1102   PHURINE 5.0 06/05/2018 1102   GLUCOSEU Negative 04/29/2023 1400   HGBUR SMALL (A) 06/05/2018 1102   BILIRUBINUR Negative 04/29/2023 1400   KETONESUR NEGATIVE 06/05/2018 1102   PROTEINUR Negative 04/29/2023 1400   PROTEINUR NEGATIVE 06/05/2018 1102   NITRITE Positive (A) 04/29/2023 1400   NITRITE NEGATIVE 06/05/2018 1102   LEUKOCYTESUR 1+ (A) 04/29/2023 1400    Lab Results  Component Value Date   LABMICR See below: 04/29/2023   WBCUA 11-30 (A) 04/29/2023   LABEPIT 0-10 04/29/2023   MUCUS Present 05/02/2022   BACTERIA Many (A) 04/29/2023    Pertinent Imaging:  No results found for this or any previous visit.  No results found for this or any previous visit.  No results found for this or any previous visit.  No results found for this or any previous visit.  No results found for this or any previous visit.  No results found for this or any previous visit.  No results found for this or any previous visit.  No results found for this or any previous visit.   Assessment & Plan:    1. Elevated PSA  (Primary) Continue finasteride  5mg  daily Followup 6 months with PSA - PSA  2. Nocturia Continue finasteride  5mg     No follow-ups on file.  Belvie Clara, MD  [facility] [place]

## 2023-09-03 ENCOUNTER — Encounter: Payer: Self-pay | Admitting: Urology

## 2023-09-03 NOTE — Patient Instructions (Signed)

## 2023-09-10 NOTE — Progress Notes (Signed)
Letter sent.

## 2023-10-01 DIAGNOSIS — I429 Cardiomyopathy, unspecified: Secondary | ICD-10-CM | POA: Diagnosis not present

## 2023-10-01 DIAGNOSIS — M48061 Spinal stenosis, lumbar region without neurogenic claudication: Secondary | ICD-10-CM | POA: Diagnosis not present

## 2023-10-01 DIAGNOSIS — I509 Heart failure, unspecified: Secondary | ICD-10-CM | POA: Diagnosis not present

## 2023-10-01 DIAGNOSIS — R52 Pain, unspecified: Secondary | ICD-10-CM | POA: Diagnosis not present

## 2023-10-01 DIAGNOSIS — I10 Essential (primary) hypertension: Secondary | ICD-10-CM | POA: Diagnosis not present

## 2023-10-01 DIAGNOSIS — Z299 Encounter for prophylactic measures, unspecified: Secondary | ICD-10-CM | POA: Diagnosis not present

## 2023-10-01 DIAGNOSIS — L720 Epidermal cyst: Secondary | ICD-10-CM | POA: Diagnosis not present

## 2023-10-08 DIAGNOSIS — L989 Disorder of the skin and subcutaneous tissue, unspecified: Secondary | ICD-10-CM | POA: Diagnosis not present

## 2023-10-10 DIAGNOSIS — I96 Gangrene, not elsewhere classified: Secondary | ICD-10-CM | POA: Diagnosis not present

## 2023-10-10 DIAGNOSIS — Z79899 Other long term (current) drug therapy: Secondary | ICD-10-CM | POA: Diagnosis not present

## 2023-10-10 DIAGNOSIS — Z7982 Long term (current) use of aspirin: Secondary | ICD-10-CM | POA: Diagnosis not present

## 2023-10-10 DIAGNOSIS — I2510 Atherosclerotic heart disease of native coronary artery without angina pectoris: Secondary | ICD-10-CM | POA: Diagnosis not present

## 2023-10-10 DIAGNOSIS — L98499 Non-pressure chronic ulcer of skin of other sites with unspecified severity: Secondary | ICD-10-CM | POA: Diagnosis not present

## 2023-10-10 DIAGNOSIS — L578 Other skin changes due to chronic exposure to nonionizing radiation: Secondary | ICD-10-CM | POA: Diagnosis not present

## 2023-10-10 DIAGNOSIS — L0211 Cutaneous abscess of neck: Secondary | ICD-10-CM | POA: Diagnosis not present

## 2023-10-10 DIAGNOSIS — I10 Essential (primary) hypertension: Secondary | ICD-10-CM | POA: Diagnosis not present

## 2023-10-10 DIAGNOSIS — L720 Epidermal cyst: Secondary | ICD-10-CM | POA: Diagnosis not present

## 2023-10-10 DIAGNOSIS — L821 Other seborrheic keratosis: Secondary | ICD-10-CM | POA: Diagnosis not present

## 2023-10-11 DIAGNOSIS — Z09 Encounter for follow-up examination after completed treatment for conditions other than malignant neoplasm: Secondary | ICD-10-CM | POA: Diagnosis not present

## 2023-10-11 DIAGNOSIS — L989 Disorder of the skin and subcutaneous tissue, unspecified: Secondary | ICD-10-CM | POA: Diagnosis not present

## 2023-10-16 DIAGNOSIS — Z09 Encounter for follow-up examination after completed treatment for conditions other than malignant neoplasm: Secondary | ICD-10-CM | POA: Diagnosis not present

## 2023-10-16 DIAGNOSIS — L989 Disorder of the skin and subcutaneous tissue, unspecified: Secondary | ICD-10-CM | POA: Diagnosis not present

## 2023-11-06 DIAGNOSIS — Z09 Encounter for follow-up examination after completed treatment for conditions other than malignant neoplasm: Secondary | ICD-10-CM | POA: Diagnosis not present

## 2023-11-06 DIAGNOSIS — L989 Disorder of the skin and subcutaneous tissue, unspecified: Secondary | ICD-10-CM | POA: Diagnosis not present

## 2023-11-20 DIAGNOSIS — L720 Epidermal cyst: Secondary | ICD-10-CM | POA: Insufficient documentation

## 2023-11-20 DIAGNOSIS — L02415 Cutaneous abscess of right lower limb: Secondary | ICD-10-CM | POA: Diagnosis not present

## 2023-11-25 DIAGNOSIS — H01001 Unspecified blepharitis right upper eyelid: Secondary | ICD-10-CM | POA: Diagnosis not present

## 2023-11-25 DIAGNOSIS — H401111 Primary open-angle glaucoma, right eye, mild stage: Secondary | ICD-10-CM | POA: Diagnosis not present

## 2023-11-25 DIAGNOSIS — H02831 Dermatochalasis of right upper eyelid: Secondary | ICD-10-CM | POA: Diagnosis not present

## 2023-11-25 DIAGNOSIS — H02834 Dermatochalasis of left upper eyelid: Secondary | ICD-10-CM | POA: Diagnosis not present

## 2023-11-28 DIAGNOSIS — L02415 Cutaneous abscess of right lower limb: Secondary | ICD-10-CM | POA: Diagnosis not present

## 2023-11-28 DIAGNOSIS — L089 Local infection of the skin and subcutaneous tissue, unspecified: Secondary | ICD-10-CM | POA: Insufficient documentation

## 2023-11-28 DIAGNOSIS — L720 Epidermal cyst: Secondary | ICD-10-CM | POA: Diagnosis not present

## 2023-11-29 DIAGNOSIS — L720 Epidermal cyst: Secondary | ICD-10-CM | POA: Diagnosis not present

## 2023-11-29 DIAGNOSIS — S71101A Unspecified open wound, right thigh, initial encounter: Secondary | ICD-10-CM | POA: Diagnosis not present

## 2023-11-29 DIAGNOSIS — L02415 Cutaneous abscess of right lower limb: Secondary | ICD-10-CM | POA: Diagnosis not present

## 2023-12-11 DIAGNOSIS — L723 Sebaceous cyst: Secondary | ICD-10-CM | POA: Diagnosis not present

## 2023-12-11 DIAGNOSIS — L089 Local infection of the skin and subcutaneous tissue, unspecified: Secondary | ICD-10-CM | POA: Diagnosis not present

## 2023-12-11 DIAGNOSIS — I10 Essential (primary) hypertension: Secondary | ICD-10-CM | POA: Diagnosis not present

## 2023-12-11 DIAGNOSIS — Z79899 Other long term (current) drug therapy: Secondary | ICD-10-CM | POA: Diagnosis not present

## 2023-12-11 DIAGNOSIS — E7800 Pure hypercholesterolemia, unspecified: Secondary | ICD-10-CM | POA: Diagnosis not present

## 2023-12-11 DIAGNOSIS — Z7982 Long term (current) use of aspirin: Secondary | ICD-10-CM | POA: Diagnosis not present

## 2023-12-11 DIAGNOSIS — I2510 Atherosclerotic heart disease of native coronary artery without angina pectoris: Secondary | ICD-10-CM | POA: Diagnosis not present

## 2023-12-25 DIAGNOSIS — I10 Essential (primary) hypertension: Secondary | ICD-10-CM | POA: Diagnosis not present

## 2023-12-25 DIAGNOSIS — L089 Local infection of the skin and subcutaneous tissue, unspecified: Secondary | ICD-10-CM | POA: Diagnosis not present

## 2023-12-25 DIAGNOSIS — I2510 Atherosclerotic heart disease of native coronary artery without angina pectoris: Secondary | ICD-10-CM | POA: Diagnosis not present

## 2023-12-25 DIAGNOSIS — L723 Sebaceous cyst: Secondary | ICD-10-CM | POA: Diagnosis not present

## 2023-12-25 DIAGNOSIS — Z79899 Other long term (current) drug therapy: Secondary | ICD-10-CM | POA: Diagnosis not present

## 2023-12-25 DIAGNOSIS — Z7982 Long term (current) use of aspirin: Secondary | ICD-10-CM | POA: Diagnosis not present

## 2023-12-25 DIAGNOSIS — E7800 Pure hypercholesterolemia, unspecified: Secondary | ICD-10-CM | POA: Diagnosis not present

## 2024-01-09 DIAGNOSIS — M549 Dorsalgia, unspecified: Secondary | ICD-10-CM | POA: Diagnosis not present

## 2024-01-09 DIAGNOSIS — Z299 Encounter for prophylactic measures, unspecified: Secondary | ICD-10-CM | POA: Diagnosis not present

## 2024-01-09 DIAGNOSIS — I10 Essential (primary) hypertension: Secondary | ICD-10-CM | POA: Diagnosis not present

## 2024-01-09 DIAGNOSIS — I509 Heart failure, unspecified: Secondary | ICD-10-CM | POA: Diagnosis not present

## 2024-01-09 DIAGNOSIS — R52 Pain, unspecified: Secondary | ICD-10-CM | POA: Diagnosis not present

## 2024-01-15 DIAGNOSIS — L723 Sebaceous cyst: Secondary | ICD-10-CM | POA: Diagnosis not present

## 2024-01-15 DIAGNOSIS — L089 Local infection of the skin and subcutaneous tissue, unspecified: Secondary | ICD-10-CM | POA: Diagnosis not present

## 2024-01-15 DIAGNOSIS — L732 Hidradenitis suppurativa: Secondary | ICD-10-CM | POA: Diagnosis not present

## 2024-02-04 DIAGNOSIS — I509 Heart failure, unspecified: Secondary | ICD-10-CM | POA: Diagnosis not present

## 2024-02-04 DIAGNOSIS — Z7189 Other specified counseling: Secondary | ICD-10-CM | POA: Diagnosis not present

## 2024-02-04 DIAGNOSIS — Z299 Encounter for prophylactic measures, unspecified: Secondary | ICD-10-CM | POA: Diagnosis not present

## 2024-02-04 DIAGNOSIS — Z1389 Encounter for screening for other disorder: Secondary | ICD-10-CM | POA: Diagnosis not present

## 2024-02-04 DIAGNOSIS — I700 Atherosclerosis of aorta: Secondary | ICD-10-CM | POA: Diagnosis not present

## 2024-02-04 DIAGNOSIS — R52 Pain, unspecified: Secondary | ICD-10-CM | POA: Diagnosis not present

## 2024-02-04 DIAGNOSIS — Z0000 Encounter for general adult medical examination without abnormal findings: Secondary | ICD-10-CM | POA: Diagnosis not present

## 2024-02-04 DIAGNOSIS — I10 Essential (primary) hypertension: Secondary | ICD-10-CM | POA: Diagnosis not present

## 2024-02-05 DIAGNOSIS — L732 Hidradenitis suppurativa: Secondary | ICD-10-CM | POA: Diagnosis not present

## 2024-02-05 DIAGNOSIS — L723 Sebaceous cyst: Secondary | ICD-10-CM | POA: Diagnosis not present

## 2024-02-05 DIAGNOSIS — L089 Local infection of the skin and subcutaneous tissue, unspecified: Secondary | ICD-10-CM | POA: Diagnosis not present

## 2024-02-10 DIAGNOSIS — M549 Dorsalgia, unspecified: Secondary | ICD-10-CM | POA: Diagnosis not present

## 2024-02-10 DIAGNOSIS — M47817 Spondylosis without myelopathy or radiculopathy, lumbosacral region: Secondary | ICD-10-CM | POA: Diagnosis not present

## 2024-02-10 DIAGNOSIS — M47816 Spondylosis without myelopathy or radiculopathy, lumbar region: Secondary | ICD-10-CM | POA: Diagnosis not present

## 2024-02-10 DIAGNOSIS — M4316 Spondylolisthesis, lumbar region: Secondary | ICD-10-CM | POA: Diagnosis not present

## 2024-02-24 ENCOUNTER — Other Ambulatory Visit: Payer: Medicare Other

## 2024-02-24 DIAGNOSIS — R972 Elevated prostate specific antigen [PSA]: Secondary | ICD-10-CM

## 2024-02-25 ENCOUNTER — Ambulatory Visit: Payer: Self-pay | Admitting: Urology

## 2024-02-25 LAB — PSA: Prostate Specific Ag, Serum: 4.3 ng/mL — ABNORMAL HIGH (ref 0.0–4.0)

## 2024-03-02 ENCOUNTER — Encounter: Payer: Self-pay | Admitting: Urology

## 2024-03-02 ENCOUNTER — Ambulatory Visit: Payer: Medicare Other | Admitting: Urology

## 2024-03-02 VITALS — BP 147/70 | HR 55

## 2024-03-02 DIAGNOSIS — R972 Elevated prostate specific antigen [PSA]: Secondary | ICD-10-CM

## 2024-03-02 DIAGNOSIS — R351 Nocturia: Secondary | ICD-10-CM | POA: Diagnosis not present

## 2024-03-02 LAB — MICROSCOPIC EXAMINATION

## 2024-03-02 LAB — URINALYSIS, ROUTINE W REFLEX MICROSCOPIC
Bilirubin, UA: NEGATIVE
Glucose, UA: NEGATIVE
Ketones, UA: NEGATIVE
Nitrite, UA: POSITIVE — AB
Protein,UA: NEGATIVE
RBC, UA: NEGATIVE
Specific Gravity, UA: 1.015 (ref 1.005–1.030)
Urobilinogen, Ur: 0.2 mg/dL (ref 0.2–1.0)
pH, UA: 6 (ref 5.0–7.5)

## 2024-03-02 MED ORDER — FINASTERIDE 5 MG PO TABS
5.0000 mg | ORAL_TABLET | Freq: Every day | ORAL | 3 refills | Status: AC
Start: 2024-03-02 — End: ?

## 2024-03-02 NOTE — Patient Instructions (Signed)

## 2024-03-02 NOTE — Progress Notes (Signed)
 03/02/2024 2:29 PM   Darius Baxter 1934-01-18 [id number]  Referring provider: Maree Isles, MD [address] [place],  [place] [address]  Followup elevated PSA   HPI: Mr Hemmer is a 89yo here for followup for elevated PSA. PSA decreased to 4.3 from 4.6. IPSS 5 QOL 2. Uirne stream strong. NO straining to urinate. Nocturia 0-2x depending on fluid on consumption.    PMH: Past Medical History:  Diagnosis Date   Arthritis    Cancer of kidney (HCC)    Carotid artery disease (HCC)    Chronic lower back pain    Coronary artery disease    Mild at cardiac catheterization 2013   Essential hypertension    History of blood transfusion 2008; 2010   Mitral valve prolapse    Mild mitral regurgitation   Mixed hyperlipidemia    Myocardial infarction (HCC)    2013   Nocturia    Renal cancer (HCC)    Status post right nephrectomy - 09/2006   Seizures (HCC)    Childhood-89 yrs old passed out   Takotsubo cardiomyopathy    February 2013   Ventricular ectopy    Bigeminy    Surgical History: Past Surgical History:  Procedure Laterality Date   APPENDECTOMY     BIOPSY  10/19/2019   Procedure: BIOPSY;  Surgeon: Harvey Margo CROME, MD;  [facility];  Service: Endoscopy;;   CARDIAC CATHETERIZATION     CAROTID ENDARTERECTOMY Left 02/23/2016   CAROTID ENDARTERECTOMY Right 06/11/2018   PATCH ANGIOPLASTY WITH XENOSURE PATCH   CATARACT EXTRACTION W/PHACO  03/22/2011   Procedure: CATARACT EXTRACTION PHACO AND INTRAOCULAR LENS PLACEMENT (IOC);  Surgeon: Cherene Mania;  Location: [facility];  Service: Ophthalmology;  Laterality: Right;  CDE  19.88   CATARACT EXTRACTION W/PHACO  04/16/2011   Procedure: CATARACT EXTRACTION PHACO AND INTRAOCULAR LENS PLACEMENT (IOC);  Surgeon: Cherene Mania;  Location: [facility];  Service: Ophthalmology;  Laterality: Left;  CDE 21.13   CHOLECYSTECTOMY  01/29/2012   Procedure: LAPAROSCOPIC CHOLECYSTECTOMY WITH INTRAOPERATIVE CHOLANGIOGRAM;  Surgeon: Debby LABOR. Cornett, MD;   [facility];  Service: General;  Laterality: N/A;   ENDARTERECTOMY Left 02/23/2016   Procedure: LEFT CAROTID ENDARTERECTOMY WITH XENOSURE BOVINE PERICARDIUM PATCH ANGIOPLASTY;  Surgeon: Gaile LELON New, MD;  [facility];  Service: Vascular;  Laterality: Left;   ENDARTERECTOMY Right 06/11/2018   Procedure: ENDARTERECTOMY CAROTID RIGHT;  Surgeon: New Gaile LELON, MD;  Location: [facility];  Service: Vascular;  Laterality: Right;   ESOPHAGOGASTRODUODENOSCOPY N/A 10/19/2019   one benign-appearing, intrinsic moderate stenosis s/p dilation. Moderate gastritis due to aspirin , mild duodenitis.    JOINT REPLACEMENT     KNEE ARTHROSCOPY Right    LITHOTRIPSY     NEPHRECTOMY Right 2008   [facility] ANGIOPLASTY Right 06/11/2018   Procedure: PATCH ANGIOPLASTY WITH GEORGE LISLE;  Surgeon: New Gaile LELON, MD;  Location: [facility] OR;  Service: Vascular;  Laterality: Right;   SAVORY DILATION N/A 10/19/2019   Procedure: SAVORY DILATION;  Surgeon: Harvey Margo CROME, MD;  [facility];  Service: Endoscopy;  Laterality: N/A;   TOTAL KNEE ARTHROPLASTY  2010   TOTAL KNEE ARTHROPLASTY Left 2014    Home Medications:  Allergies as of 03/02/2024       Reactions   Nsaids    Patient has only 1 kidney and Doctor  Doesn't want him to take   Vicodin [hydrocodone -acetaminophen ] Nausea And Vomiting   IV only; no problems taking oral tablet  Medication List        Accurate as of March 02, 2024  2:29 PM. If you have any questions, ask your nurse or doctor.          acetaminophen  500 MG tablet Commonly known as: TYLENOL  Take 1,000 mg by mouth daily as needed for moderate pain.   amLODipine  5 MG tablet Commonly known as: NORVASC  Take 5 mg by mouth at bedtime.   aspirin  EC 81 MG tablet Take 1 tablet (81 mg total) by mouth daily.   atorvastatin  40 MG tablet Commonly known as: LIPITOR Take 40 mg by mouth at bedtime.   carvedilol  6.25 MG tablet Commonly known as: COREG  Take 6.25 mg by mouth 2  (two) times daily with a meal.   finasteride  5 MG tablet Commonly known as: PROSCAR  Take 1 tablet (5 mg total) by mouth daily.   furosemide  20 MG tablet Commonly known as: LASIX  Take 20 mg by mouth daily.   hydrALAZINE  50 MG tablet Commonly known as: APRESOLINE  Take 50 mg by mouth 2 (two) times daily.   HYDROcodone -acetaminophen  7.5-325 MG tablet Commonly known as: NORCO Take 1-2 tablets by mouth as needed for moderate pain. Takes one tablet 2 times a week.   nitrofurantoin  (macrocrystal-monohydrate) 100 MG capsule Commonly known as: MACROBID  Take 1 capsule (100 mg total) by mouth 2 (two) times daily.   olmesartan  40 MG tablet Commonly known as: BENICAR  Take 40 mg by mouth daily.        Allergies:  Allergies  Allergen Reactions   Nsaids     Patient has only 1 kidney and Doctor  Doesn't want him to take   Vicodin [Hydrocodone -Acetaminophen ] Nausea And Vomiting    IV only; no problems taking oral tablet    Family History: Family History  Problem Relation Age of Onset   Cancer Other    Stroke Other    Coronary artery disease Other    Hypertension Other    Hypertension Mother    Peripheral Artery Disease Mother        had leg bypass, and died after surgery; believed to be from throwing a clot   CVA Father    Prostate cancer Father    Anesthesia problems Neg Hx    Hypotension Neg Hx    Malignant hyperthermia Neg Hx    Pseudochol deficiency Neg Hx     Social History:  reports that he has never smoked. He has never been exposed to tobacco smoke. He has never used smokeless tobacco. He reports that he does not drink alcohol and does not use drugs.  ROS: All other review of systems were reviewed and are negative except what is noted above in HPI  Physical Exam: BP (!) 147/70   Pulse (!) 55   Constitutional:  Alert and oriented, No acute distress. HEENT: [place] AT, moist mucus membranes.  Trachea midline, no masses. Cardiovascular: No clubbing, cyanosis, or  edema. Respiratory: Normal respiratory effort, no increased work of breathing. GI: Abdomen is soft, nontender, nondistended, no abdominal masses GU: No CVA tenderness.  Lymph: No cervical or inguinal lymphadenopathy. Skin: No rashes, bruises or suspicious lesions. Neurologic: Grossly intact, no focal deficits, moving all 4 extremities. Psychiatric: Normal mood and affect.  Laboratory Data: Lab Results  Component Value Date   WBC 7.0 05/23/2019   HGB 15.2 05/23/2019   HCT 47.6 05/23/2019   MCV 97.3 05/23/2019   PLT 155 05/23/2019    Lab Results  Component Value Date   CREATININE 1.59 (H)  03/30/2021    No results found for: PSA  No results found for: TESTOSTERONE  No results found for: HGBA1C  Urinalysis    Component Value Date/Time   COLORURINE YELLOW 06/05/2018 1102   APPEARANCEUR Clear 04/29/2023 1400   LABSPEC 1.009 06/05/2018 1102   PHURINE 5.0 06/05/2018 1102   GLUCOSEU Negative 04/29/2023 1400   HGBUR SMALL (A) 06/05/2018 1102   BILIRUBINUR Negative 04/29/2023 1400   KETONESUR NEGATIVE 06/05/2018 1102   PROTEINUR Negative 04/29/2023 1400   PROTEINUR NEGATIVE 06/05/2018 1102   NITRITE Positive (A) 04/29/2023 1400   NITRITE NEGATIVE 06/05/2018 1102   LEUKOCYTESUR 1+ (A) 04/29/2023 1400    Lab Results  Component Value Date   LABMICR See below: 04/29/2023   WBCUA 11-30 (A) 04/29/2023   LABEPIT 0-10 04/29/2023   MUCUS Present 05/02/2022   BACTERIA Many (A) 04/29/2023    Pertinent Imaging:  No results found for this or any previous visit.  No results found for this or any previous visit.  No results found for this or any previous visit.  No results found for this or any previous visit.  No results found for this or any previous visit.  No results found for this or any previous visit.  No results found for this or any previous visit.  No results found for this or any previous visit.   Assessment & Plan:    1. Elevated PSA  (Primary) -followup 1 year with a PSA -continue finasteride  5mg  daily - Urinalysis, Routine w reflex microscopic   No follow-ups on file.  Belvie Clara, MD  [facility] [place]

## 2024-03-05 LAB — URINE CULTURE

## 2024-03-10 ENCOUNTER — Ambulatory Visit: Payer: Self-pay | Admitting: Urology

## 2024-03-10 MED ORDER — SULFAMETHOXAZOLE-TRIMETHOPRIM 800-160 MG PO TABS: 1.0000 | ORAL_TABLET | Freq: Two times a day (BID) | ORAL | 0 refills | Status: AC

## 2024-03-10 NOTE — Telephone Encounter (Signed)
-----   Message from Belvie Clara sent at 03/10/2024  8:48 AM EDT ----- Bactrim  DS BID for 7 days ----- Message ----- From: Rebecka Memos Lab Results In Sent: 03/02/2024   3:35 PM EDT To: Belvie LITTIE Clara, MD

## 2024-03-10 NOTE — Telephone Encounter (Signed)
Patient called and made aware of positive urine culture and antibiotic sent to pharmacy. 

## 2024-03-11 ENCOUNTER — Encounter: Payer: Self-pay | Admitting: Cardiology

## 2024-03-11 ENCOUNTER — Ambulatory Visit: Attending: Cardiology | Admitting: Cardiology

## 2024-03-11 VITALS — BP 118/60 | HR 68 | Ht 72.0 in | Wt 184.2 lb

## 2024-03-11 DIAGNOSIS — I10 Essential (primary) hypertension: Secondary | ICD-10-CM | POA: Diagnosis not present

## 2024-03-11 DIAGNOSIS — I5032 Chronic diastolic (congestive) heart failure: Secondary | ICD-10-CM

## 2024-03-11 DIAGNOSIS — I340 Nonrheumatic mitral (valve) insufficiency: Secondary | ICD-10-CM

## 2024-03-11 DIAGNOSIS — E782 Mixed hyperlipidemia: Secondary | ICD-10-CM | POA: Diagnosis not present

## 2024-03-11 NOTE — Patient Instructions (Signed)

## 2024-03-11 NOTE — Progress Notes (Signed)
    Cardiology Office Note  Date: 03/11/2024   ID: YU PEGGS, DOB Jan 29, 1934, MRN [id number]  History of Present Illness: Darius Baxter is an 89 y.o. male last seen in June 2024.  He is here today with his wife for a follow-up visit.  He does not report any progressive angina with typical activities.  Mainly limited by chronic back pain.  No palpitations or syncope.  He continues to follow with Dr. Maree.  We went over his medications.  He reports compliance with current regimen, no obvious intolerances.  Blood pressure is well-controlled today.  His last lipid panel in June 2024 indicated LDL 46.  He will have follow-up lab work in the fall.  I reviewed his ECG today which shows sinus rhythm with frequent PVCs.  Physical Exam: VS:  BP 118/60 (BP Location: Left Arm)   Pulse 68   Ht 6' (1.829 m)   Wt 184 lb 3.2 oz (83.6 kg)   SpO2 95%   BMI 24.98 kg/m , BMI Body mass index is 24.98 kg/m.  Wt Readings from Last 3 Encounters:  03/11/24 184 lb 3.2 oz (83.6 kg)  01/22/23 199 lb (90.3 kg)  11/15/22 203 lb 12.8 oz (92.4 kg)    General: Patient appears comfortable at rest. HEENT: Conjunctiva and lids normal,. Neck: Supple, no elevated JVP or carotid bruits. Lungs: Clear to auscultation, nonlabored breathing at rest. Cardiac: Regular rate and rhythm, no S3 or significant systolic murmur. Extremities: No pitting edema.  ECG:  An ECG dated 01/22/2023 was personally reviewed today and demonstrated:  Sinus bradycardia.  Labwork:  June 2024: Hemoglobin 13.9, platelets 172, BUN 21, creatinine 1.1, GFR 65, potassium 4.8, AST 13, ALT 9, cholesterol 119, triglycerides 164, HDL 46, LDL 46, TSH 2.37  Other Studies Reviewed Today:  No interval cardiac testing for review today.  Assessment and Plan:  1.  HFrecEF with history of stress-induced cardiomyopathy, LVEF 65 to 70% by echocardiogram in August 2022.  He is clinically stable, NYHA class II dyspnea, no angina.  Plan to continue  Coreg  6.25 mg twice daily, Benicar  40 mg daily, and Lasix  20 mg daily.   2.  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   3.  Primary hypertension.  Blood pressure well-controlled today.  He is also on Norvasc  5 mg daily and hydralazine  50 mg twice daily.   4.  Mixed hyperlipidemia.  LDL 46 in June 2024.  Continue Lipitor 40 mg daily.  5.  Mild carotid artery disease, 1 to 39% bilateral ICA stenosis by Dopplers in March 2024.  He is asymptomatic.  He is on aspirin  81 mg daily.  Disposition:  Follow up 1 year.  Signed, Jayson JUDITHANN Sierras, M.D., F.A.C.C. [facility]

## 2024-05-19 DIAGNOSIS — R404 Transient alteration of awareness: Secondary | ICD-10-CM | POA: Diagnosis not present

## 2024-05-19 DIAGNOSIS — I2510 Atherosclerotic heart disease of native coronary artery without angina pectoris: Secondary | ICD-10-CM | POA: Diagnosis not present

## 2024-05-19 DIAGNOSIS — I6523 Occlusion and stenosis of bilateral carotid arteries: Secondary | ICD-10-CM | POA: Diagnosis not present

## 2024-05-19 DIAGNOSIS — I493 Ventricular premature depolarization: Secondary | ICD-10-CM | POA: Diagnosis not present

## 2024-05-19 DIAGNOSIS — J323 Chronic sphenoidal sinusitis: Secondary | ICD-10-CM | POA: Diagnosis not present

## 2024-05-19 DIAGNOSIS — Z743 Need for continuous supervision: Secondary | ICD-10-CM | POA: Diagnosis not present

## 2024-05-19 DIAGNOSIS — Z79899 Other long term (current) drug therapy: Secondary | ICD-10-CM | POA: Diagnosis not present

## 2024-05-19 DIAGNOSIS — R410 Disorientation, unspecified: Secondary | ICD-10-CM | POA: Diagnosis not present

## 2024-05-19 DIAGNOSIS — I708 Atherosclerosis of other arteries: Secondary | ICD-10-CM | POA: Diagnosis not present

## 2024-05-19 DIAGNOSIS — I10 Essential (primary) hypertension: Secondary | ICD-10-CM | POA: Diagnosis not present

## 2024-05-19 DIAGNOSIS — R9431 Abnormal electrocardiogram [ECG] [EKG]: Secondary | ICD-10-CM | POA: Diagnosis not present

## 2024-05-21 DIAGNOSIS — C649 Malignant neoplasm of unspecified kidney, except renal pelvis: Secondary | ICD-10-CM | POA: Diagnosis not present

## 2024-05-21 DIAGNOSIS — I10 Essential (primary) hypertension: Secondary | ICD-10-CM | POA: Diagnosis not present

## 2024-05-21 DIAGNOSIS — K5900 Constipation, unspecified: Secondary | ICD-10-CM | POA: Diagnosis not present

## 2024-05-21 DIAGNOSIS — I429 Cardiomyopathy, unspecified: Secondary | ICD-10-CM | POA: Diagnosis not present

## 2024-05-21 DIAGNOSIS — I700 Atherosclerosis of aorta: Secondary | ICD-10-CM | POA: Diagnosis not present

## 2024-05-21 DIAGNOSIS — Z299 Encounter for prophylactic measures, unspecified: Secondary | ICD-10-CM | POA: Diagnosis not present

## 2024-05-21 DIAGNOSIS — R52 Pain, unspecified: Secondary | ICD-10-CM | POA: Diagnosis not present

## 2024-05-28 DIAGNOSIS — H02834 Dermatochalasis of left upper eyelid: Secondary | ICD-10-CM | POA: Diagnosis not present

## 2024-05-28 DIAGNOSIS — H02831 Dermatochalasis of right upper eyelid: Secondary | ICD-10-CM | POA: Diagnosis not present

## 2024-05-28 DIAGNOSIS — H01001 Unspecified blepharitis right upper eyelid: Secondary | ICD-10-CM | POA: Diagnosis not present

## 2024-05-28 DIAGNOSIS — H401111 Primary open-angle glaucoma, right eye, mild stage: Secondary | ICD-10-CM | POA: Diagnosis not present

## 2024-06-02 DIAGNOSIS — Z23 Encounter for immunization: Secondary | ICD-10-CM | POA: Diagnosis not present

## 2024-06-02 DIAGNOSIS — R5383 Other fatigue: Secondary | ICD-10-CM | POA: Diagnosis not present

## 2024-06-02 DIAGNOSIS — Z79899 Other long term (current) drug therapy: Secondary | ICD-10-CM | POA: Diagnosis not present

## 2024-06-02 DIAGNOSIS — E7800 Pure hypercholesterolemia, unspecified: Secondary | ICD-10-CM | POA: Diagnosis not present

## 2024-06-02 DIAGNOSIS — M48061 Spinal stenosis, lumbar region without neurogenic claudication: Secondary | ICD-10-CM | POA: Diagnosis not present

## 2024-06-02 DIAGNOSIS — I10 Essential (primary) hypertension: Secondary | ICD-10-CM | POA: Diagnosis not present

## 2024-06-02 DIAGNOSIS — Z299 Encounter for prophylactic measures, unspecified: Secondary | ICD-10-CM | POA: Diagnosis not present

## 2024-06-02 DIAGNOSIS — Z0000 Encounter for general adult medical examination without abnormal findings: Secondary | ICD-10-CM | POA: Diagnosis not present

## 2024-06-12 DIAGNOSIS — R4182 Altered mental status, unspecified: Secondary | ICD-10-CM | POA: Insufficient documentation

## 2024-06-12 DIAGNOSIS — R531 Weakness: Secondary | ICD-10-CM | POA: Diagnosis not present

## 2024-06-12 NOTE — ED Provider Notes (Addendum)
 Emergency Department Provider Note    ED Clinical Impression   Final diagnoses:  Altered mental status, unspecified altered mental status type (Primary)  Weakness    ED Assessment/Plan    Condition: Stable Disposition: Discharge  This chart has been completed using Dragon Medical Dictation software, and while attempts have been made to ensure accuracy, certain words and phrases may not be transcribed as intended.   History   Chief Complaint  Patient presents with  . Stroke Alert   HPI  Darius Baxter is a 89 y.o. male  who presents today to the  emergency department complaining of altered mental status.  EMS states that they were called for possible cardiac arrest because patient apparently was not breathing well according to family.  Upon arrival, patient was breathing but was difficult to arouse.  Patient did have a pulse.  EMS reports that pupils seem to be constricted also.  EMS reports that en route, patient became more arousable and was talking.  They felt he had left-sided weakness as they did not see him move his left side much.  That has since resolved because he is able to move all extremities here with me.  He was last well around 11:00 AM when he went to bed according to history obtained from EMS.  He denies any chest pain.  Of note, patient does have a history of lumbar vertebral fracture and is on Norco.  History obtained from EMS.    Allergies: is allergic to naproxen sodium, nsaids (non-steroidal anti-inflammatory drug), and hydrocodone . Medications: has a current medication list which includes the following long-term medication(s): amlodipine , aspirin , atorvastatin , carvedilol , furosemide , hydralazine , and olmesartan . PMHx:  has a past medical history of Aortic atherosclerosis, BPH (benign prostatic hyperplasia), CAD (coronary artery disease), Cancer    (CMS-HCC), Carotid  artery disease, Chronic back pain, Coronary artery disease, DDD (degenerative disc disease), cervical, Hepatic cyst, High cholesterol, History of transfusion, Hypertension, Kidney stone, Osteoarthritis, Peptic stricture of esophagus, Renal cyst, Skin lesion of neck (10/08/2023), and Stress-induced cardiomyopathy (2013). PSHx:  has a past surgical history that includes Joint replacement (Bilateral); Nephrectomy (Right, 2008); Cholecystectomy; Carotid endarterectomy (Bilateral); Appendectomy; Cataract extraction, bilateral; Lithotripsy; Esophagogastroduodenoscopy (2021); Cardiac catheterization (2013); pr exc skin benig 2.1-3 cm remaindr body (Right, 10/10/2023); pr incision & drainage abscess complicated/multiple (Right, 10/10/2023); pr punch biopsy skin single lesion (Right, 10/10/2023); pr incision & drainage abscess complicated/multiple (Right, 11/28/2023); and pr debridement subcutaneous tissue 1st 20 sq cm/< (Right, 11/28/2023). SocHx:  reports that he has never smoked. He has never used smokeless tobacco. He reports that he does not drink alcohol and does not use drugs. Allergies, Medications, Medical, Surgical, and Social History were reviewed as documented above.   Social Drivers of Health with Concerns   Food Insecurity: Not on file  Transportation Needs: Not on file  Alcohol Use: Not on file  Housing: Not on file  Physical Activity: Not on file  Utilities: Not on file  Stress: Not on file  Substance Use: Not on file (06/26/2023)  Social Connections: Not on file  Financial Resource Strain: Not on file  Health Literacy: Not on file  Internet Connectivity: Not on file     Review Of Systems  Review of Systems  Unable to perform ROS: Mental status change  Neurological:  Positive for weakness.  Physical Exam   BP 199/67   Pulse 68   Temp 36.4 C (97.5 F) (Oral)   Resp 19   Ht 172.7 cm (5' 8)   Wt 82.4 kg (181 lb 10.5 oz)   SpO2 96%   BMI 27.62 kg/m   Physical Exam Vitals  and nursing note reviewed.  Constitutional:      General: He is not in acute distress. HENT:     Head: Normocephalic.  Eyes:     Conjunctiva/sclera: Conjunctivae normal.  Cardiovascular:     Rate and Rhythm: Regular rhythm.     Pulses: Normal pulses.     Heart sounds: Normal heart sounds.  Pulmonary:     Effort: No respiratory distress.     Breath sounds: Normal breath sounds.  Abdominal:     General: There is no distension.     Tenderness: There is no abdominal tenderness. There is no guarding or rebound.  Musculoskeletal:        General: No deformity.  Skin:    General: Skin is warm.     Capillary Refill: Capillary refill takes 2 to 3 seconds.     Comments: Normal cap refill.  Neurological:     General: No focal deficit present.     Cranial Nerves: No cranial nerve deficit.     Sensory: No sensory deficit.     Motor: No weakness.     Comments: There are no obvious motor, sensory or cerebellar deficits.  Patient moves all extremities.  GCS is 15.  Cranial nerves grossly intact.  NIH stroke score is 2.  Psychiatric:        Mood and Affect: Mood normal.     ED Course  Medical Decision Making Differential diagnosis includes medication side effect versus electrolyte abnormality versus encephalopathy versus less likely CVA.  However, we will go ahead and clinical stroke given EMS report.  Patient's care discussed with [facility] neurologist.  He will see patient.  2:20 PM Patient was seen by neurologist.  Recommends MRI given that this is less likely to be CVA.  Recommends with no basic workup.  No need for CT angio.  Patient is not a candidate for thrombectomy or thrombolytics either.  2:56 PM Patient's care discussed with hospitalist service.  Will admit.  5:19 PM Patient has been seen by hospitalist.  Patient and family do not want him to be admitted.  He wants to go home.  MRI is unremarkable.  Blood pressure improved.  I think that is reasonable.  Patient can have  outpatient follow-up with neurology.  Hospitalist service agrees.  Patient is stable for discharge.  I have reviewed my clinical findings and studies and my clinical impression with the patient and family here present, with the patient's permission as applicable. The patient and family have expressed understanding that at this time there is no evidence for a more malignant underlying process, but the patient and family also understand that early in the process of a condition such as this, an initial workup can be falsely reassuring. I have counseled the patient and discussed follow-up with the patient and family, stressing the importance of appropriate follow-up. I have also counseled the patient to return if worse or any concerns. Routine discharge counseling was given to the patient and the patient and family understand that worsening, changing or persistent symptoms should prompt an immediate call or follow up with their primary physician or return to the emergency department for reevaluation. Patient and family have expressed understanding.  Problems Addressed: Altered mental status, unspecified altered mental status type: acute illness or injury that poses a threat to life or bodily functions Weakness: acute illness or injury that poses a threat to life or bodily functions  Amount and/or Complexity of Data Reviewed Independent Historian: EMS Labs: ordered. Decision-making details documented in ED Course. Radiology: ordered. Decision-making details documented in ED Course. ECG/medicine tests: ordered and independent interpretation performed. Decision-making details documented in ED Course. Discussion of management or test interpretation with external provider(s): Patient's care discussed with neurologist, hospitalist and radiologist  Risk Decision regarding hospitalization.     Critical Care  Performed by: Cherie Ardeen Hanger, MD Authorized by: Elisabeth Brutus Bryant, FNP   Critical  care provider statement:    Critical care time (minutes):  45   Critical care time was exclusive of:  Separately billable procedures and treating other patients   Critical care was necessary to treat or prevent imminent or life-threatening deterioration of the following conditions:  CNS failure or compromise   Critical care was time spent personally by me on the following activities:  Discussions with consultants, discussions with primary provider, ordering and review of radiographic studies, ordering and review of laboratory studies, re-evaluation of patient's condition and examination of patient   Care discussed with: admitting provider      Encounter Date: 06/12/24  ECG 12 Lead  Result Value   EKG Systolic BP    EKG Diastolic BP    EKG Ventricular Rate 69   EKG Atrial Rate 69   EKG P-R Interval 176   EKG QRS Duration 90   EKG Q-T Interval 398   EKG QTC Calculation 426   EKG Calculated P Axis 88   EKG Calculated R Axis 72   EKG Calculated T Axis 35   QTC Fredericia 417   Narrative   Sinus rhythm with premature supraventricular complexes Anteroseptal infarct (cited on or before 19-May-2024) Abnormal ECG When compared with ECG of 19-May-2024 18:33, premature ventricular complexes are no longer present premature supraventricular complexes are now present Confirmed by Cherie Ardeen ([id number]) on 06/12/2024 3:39:29 PM     ED Results Results for orders placed or performed during the hospital encounter of 06/12/24  CBC  Result Value Ref Range   WBC 6.8 4.0 - 10.5 10*9/L   RBC 4.28 4.10 - 5.60 10*12/L   HGB 13.5 12.5 - 17.0 g/dL   HCT 59.5 63.9 - 49.9 %   MCV 94.4 80.0 - 98.0 fL   MCH 31.5 27.0 - 34.0 pg   MCHC 33.4 32.0 - 36.0 g/dL   RDW 87.1 88.4 - 85.4 %   MPV 9.7 7.4 - 10.4 fL   Platelet 128 (L) 140 - 415 10*9/L  Comprehensive Metabolic Panel  Result Value Ref Range   Sodium 140 135 - 145 mmol/L   Potassium 4.3 3.5 - 5.0 mmol/L   Chloride 105 98 - 107 mmol/L   CO2  26.0 21.0 - 32.0 mmol/L   Anion Gap 9 3 - 11 mmol/L   BUN 17 8 - 20 mg/dL   Creatinine 8.90 9.19 - 1.30 mg/dL   BUN/Creatinine Ratio 16    eGFR CKD-EPI (2021) Male 65 >=60 mL/min/1.2m2   Glucose 105 70 - 179 mg/dL   Calcium  9.4 8.5 - 10.1 mg/dL   Albumin 3.7 3.5 - 5.0 g/dL   Total Protein 6.6 6.0 - 8.0 g/dL   Total Bilirubin 0.7 0.3 - 1.2 mg/dL   AST 14 (L) 15 - 40 U/L  ALT 17 12 - 78 U/L   Alkaline Phosphatase 89 46 - 116 U/L  PT-INR  Result Value Ref Range   PT 11.1 9.9 - 12.6 sec   INR 1.00 Undefined  APTT  Result Value Ref Range   APTT 26.7 24.8 - 38.4 sec  hsTroponin I (single, no delta)  Result Value Ref Range   hsTroponin I 8 <=53 ng/L  Ammonia  Result Value Ref Range   Ammonia 17 11 - 32 umol/L  ECG 12 Lead  Result Value Ref Range   EKG Systolic BP  mmHg   EKG Diastolic BP  mmHg   EKG Ventricular Rate 69 BPM   EKG Atrial Rate 69 BPM   EKG P-R Interval 176 ms   EKG QRS Duration 90 ms   EKG Q-T Interval 398 ms   EKG QTC Calculation 426 ms   EKG Calculated P Axis 88 degrees   EKG Calculated R Axis 72 degrees   EKG Calculated T Axis 35 degrees   QTC Fredericia 417 ms  Arterial Blood Gas  Result Value Ref Range   pH, Arterial 7.46 (H) 7.35 - 7.45   pCO2, Arterial 34.3 (L) 35.0 - 48.0 mm[Hg]   pO2, Arterial 85 83 - 108 mm[Hg]   HCO3 (Bicarbonate), Arterial 24.5 (H) 18.0 - 23.0 mmol/L   Base Excess, Arterial 1.1 -2.0 - 3.0 mmol/L   O2 Sat, Arterial 97.7 95.0 - 98.0 %   Total Carbon Dioxide, Arterial 26 22 - 29 mmol/L   Carboxyhemoglobin 0.7 0.0 - 4.9 %   ABG Allen Test POS    BG Draw Site Radial, right   Urinalysis with Microscopy with Culture Reflex  Result Value Ref Range   Color, UA Colorless    Clarity, UA Clear Clear   Specific Gravity, UA 1.009 (L) 1.010 - 1.025   pH, UA 7.0 5.0 - 8.0   Leukocyte Esterase, UA Negative Negative   Nitrite, UA Negative Negative   Protein, UA 70 mg/dL (A) Negative   Glucose, UA Negative Negative, Trace   Ketones, UA  Negative Negative   Urobilinogen, UA <2.0 mg/dL <7.9 mg/dL   Bilirubin, UA Negative Negative   Blood, UA Trace (A) Negative   RBC, UA 3 0 - 3 /HPF   WBC, UA <1 0 - 3 /HPF   Squam Epithel, UA 0 0 - 10 /HPF   Bacteria, UA None Seen None Seen /HPF   WBC Clumps None Seen None Seen /HPF   Hyphal Yeast None Seen None Seen /HPF   Yeast, UA None Seen None Seen /HPF   ECG 12 Lead Result Date: 06/12/2024 Sinus rhythm with premature supraventricular complexes Anteroseptal infarct (cited on or before 19-May-2024) Abnormal ECG When compared with ECG of 19-May-2024 18:33, premature ventricular complexes are no longer present premature supraventricular complexes are now present Confirmed by Cherie Searle ([id number]) on 06/12/2024 3:39:29 PM  XR Chest Portable Result Date: 06/12/2024 Exam:  Portable Chest  History:  Weakness  Technique:  Single frontal view.  Comparison:  Chest radiograph dated 05/19/2024 and prior.  Findings:    Lungs are well-inflated. No focal consolidation or pulmonary edema.  No pleural effusion. No pneumothorax.  Unremarkable cardiomediastinal silhouette.  Aortic arch calcifications.     -- No radiographic evidence of acute cardiopulmonary process.  Signed (Electronic Signature): 06/12/2024 2:01 PM Signed By: Alyce Bellman, MD  CT Head Wo Contrast Result Date: 06/12/2024 Exam:  CT Head without Contrast  History:  CODE STROKE: Altered mental status, difficult to  arouse. Per EMS, left sided weakness, resolved upon presentation to ED.  Technique: Routine brain CT without IV contrast. AEC (automated exposure control) and/or manual techniques such as size-specific kV and mAs are employed where appropriate to reduce radiation exposure for all CT exams.  Comparison:  05/19/2024  Findings:   Brain:  No intra- or extra-axial hemorrhage, acute territorial infarct, or mass effect. Prominence of the sulci, ventricles, and basilar cisterns suggests moderate global volume loss. Scattered subcortical and  periventricular white matter hypoattenuation may be seen with chronic microangiopathic disease. Atherosclerotic calcifications of the carotid siphons and intracranial vertebral arteries.  Soft Tissues:  Lens replacements.  Calvarium:  No fracture.  Sinuses and Mastoids:  Chronic wall hypertrophy and sclerosis with mucosal thickening of the right sphenoid sinus. There is minimal frontoethmoidal sinus mucosal thickening. The mastoid air cells are clear.    1.    No acute intracranial abnormality. Chronic changes as described. 2.    Chronic right sphenoid sinusitis.  Notes: The CODE STROKE results were called to [facility] POKAM FOTE on 06/12/2024 1:31 PM.  Signed (Electronic Signature): 06/12/2024 1:32 PM Signed By: Thayer Kitty, MD   Medications Administered:  Medications  hydrALAZINE  (APRESOLINE ) injection 20 mg (has no administration in time range)    Discharge Medications (Medications Prescribed during this  ED visit and Patient's Home Medications) :    Your Medication List     ASK your doctor about these medications    amlodipine  5 MG tablet Commonly known as: NORVASC  Take by mouth at bedtime.   aspirin  81 MG tablet Commonly known as: ECOTRIN Take 1 tablet (81 mg total) by mouth every morning.   atorvastatin  40 MG tablet Commonly known as: LIPITOR Take 1 tablet (40 mg total) by mouth every morning.   carvedilol  6.25 MG tablet Commonly known as: COREG  Take 1 tablet (6.25 mg total) by mouth two (2) times a day.   finasteride  5 mg tablet Commonly known as: PROSCAR  Take 1 tablet (5 mg total) by mouth every morning.   furosemide  20 MG tablet Commonly known as: LASIX  Take 1 tablet (20 mg total) by mouth every morning.   hydrALAZINE  50 MG tablet Commonly known as: APRESOLINE  Take 1 tablet (50 mg total) by mouth two (2) times a day.   HYDROcodone -acetaminophen  10-325 mg per tablet Commonly known as: NORCO 10-325 Take 1-1.5 tablets by mouth two (2) times a day as needed for  pain.   olmesartan  40 MG tablet Commonly known as: BENICAR  Take 1 tablet (40 mg total) by mouth every morning.          Cherie Ardeen Hanger, MD 06/12/24 [id number]    Cherie Ardeen Hanger, MD 06/12/24 [phone]

## 2024-06-12 NOTE — Consults (Signed)
 Acute Neurology Consultation Note Darius Baxter, Darius Baxter DOB: Dec 06, 1933 Gender: M Age: 89 MRN: [id number] Location: [facility]   Date of Service  Requesting Provider  New consult or Update?  06/12/2024 13:22 EDT Dr. Cherie New Consult    Patient Location  ED  History of Present Illness   Last known well  Last known well time  HPI  Known 06/12/2024 11:00 EDT Patient went for a nap at 1100. Woke up at 1300 with slurred speech and some confusion. No focal weakness or facial droop noted. EMS was called who then brought patient to the ED.  The above history was obtained by discussion with the patient's primary physician, and the patient themselves. Teleneurology does not have access to the patient's electronic health record. NIHSS & Neuro Exam NIHSS - 1A. LOC Level: 0 Alert, 1B. LOC Questions: 2 neither question correctly, 1C. LOC Commands: 0 Obeys both commands correctly, 2. Best Gaze: 0 Normal, 3. Visual: 0 No visual loss, 4. Facial Palsy: 0 Normal symmetrical, 5A. Left Arm: 0 No drift, 5B. Right Arm: 0 No drift, 6A. Left Leg: 0 No drift, 6B. Right Leg: 0 No drift, 7. Limb Ataxia: 0 Absent, 8. Sensory: 0 Normal, 9. Best Language: 0 No aphasia, 10. Dysarthria: 0 Normal, 11. Extinction and Inattention: 0 No abnormality, Score: 2, Submitted by Thais Mani DO at 06/12/2024 13:29 EDT Imaging Documentation   Imaging Reviewed  Images Not Present    Imaging Comments  No neuroimaging available at this time. Per ED physician, CT head shows no hyperdensities concerning for hemorrhage, or large territory hypodensity.  Clinical Impression/Consult Type Clinical Impression/Consult Type Encephalopathy Is TIA/AIS in the differential? Yes Thrombolytic Decision: NO Thrombolytic Recommendation 06/12/2024 13:32 EDT Reasons for no thrombolytic Patient at functional baseline Medical Decision Making Medical Decision Making Patient went for a nap at 1100. Woke up at 1300 with slurred speech and some  confusion. No focal weakness or facial droop noted. EMS was called who then brought patient to the ED. NIH of 2 at this time as above. No neuroimaging available at this time. Per ED physician, CT head shows no hyperdensities concerning for hemorrhage, or large territory hypodensity. Symptoms are mild and nondisabling, and thus not a candidate for lytic therapy. Etiology of patients symptoms are unclear though differential includes toxic/metabolic encephalopathy vs less likely an acute ischemic stroke. Recommend MRI brain without contrast for further investigation. Start aspirin  and allow for permissive hypertension in the interim. Neurology consult to follow along. Plan/Recommendations Plan/Recommendations - Toxic/metabolic encephalopathy workup with CBC, CMP, LFTs, TSH, UDS, urinalysis, chest x-ray - MRI brain without contrast - ASA 325mg  now followed by ASA 81mg  daily (or rectal ASA 300mg  daily if unable to tolerate PO intake) pending MRI brain Electronic Signature: Thais Mani DO on 06/12/2024 at 13:54 EDT Acute Neurology Consultation Note Page 1 of 2 Acute Neurology Consultation Note Darius Baxter, Darius Baxter DOB: 07-09-1934 Gender: M Age: 89 MRN: [id number] Location: [facility] - Permissive hypertension (<220/120) pending MRI brain - q4 hours neuro checks - Neurology consult Billing & Codes   ICD-10 codes  CPT Code  G93.40 Encephalopathy, unspecified G0426, Telehealth consultations, ED/ initial IP, moderate MDM complexity    Consult Level  Level 2 - Video    Verification Statement  Provider location:  Video Attestation  I have verified the Patient Name and Date of Birth. Home Office I obtained the patient's informed verbal consent to perform this visit using Telehealth tools and answered all the questions the patient had  about the telehealth interaction. I performed this consultation using a real time live video connection between my location and the patient's hospital  location.  The Teleneurology practice is a consultative service supporting the local providers for this patient. Relevant patient information, acquired through discussion with emergency providers, independent assessment, and review of the local EMR, is to be shared with the teleneurologist at the time of consultation request. The Acute Teleneurology team should be contacted [phone]) with any neurologic worsening or clinical changes, new test results, or new patient history that is reported to or discovered by the local team following completion of the teleneurology consultation, specifically that which has the potential to impact the consultative recommendation. Any patient complaint or grievance by the collaborating hospital should be reported to your appointed Physician Services Account Specialist. The grievance will follow the process outlined in the Teleneurology Quality Management Plan. Time Target Summary   Call Center Notified  06/12/2024 13:22 EDT    Physician Notified  06/12/2024 13:23 EDT    Physician Callback  06/12/2024 13:24 EDT    Thrombolytic Recommendation  06/12/2024 13:32 EDT  Electronic Signature: Thais Mani DO on 06/12/2024 at 13:54 EDT Acute Neurology Consultation Note Page 2 of 2

## 2024-06-12 NOTE — Consults (Signed)
 ------------------------------------------------------------------------------- Attestation signed by Feliz Margart Fallow, DO at 06/13/24 0803 I was the supervising physician during time of service.  Case was discussed with APP and I agree with management as outlined below. -------------------------------------------------------------------------------   CONSULT NOTE [facility] [place]   06/12/24     Patient name: Darius Baxter DOB 1934-04-22 MRN#: [id number] PCP: Maree Eligio BROCKS, MD Date: 06/12/24 Time: 4:32 PM  Primary Care Provider:  Maree Eligio BROCKS, MD Referring Physician: Self, Referred No address on file  ________________________________________________________________   Admission HPI   Patient admitted on: 06/12/2024  1:19 PM  Patient admitted by: Brutus Franco Shank, FNP   Reason for consult: Admission for altered mental status  HPI:   Pierson Vantol  is a 89 y.o. y.o. male with a PMH significant for hypertension, coronary artery disease, GERD, hyperlipidemia who presented to [facility] on 06/12/2024 with altered mental status.  Patient's wife [name] provides history stating that 2 nights ago he was making some odd sounds during his sleep.  Earlier today he was preparing to go to the pharmacy to pick up some medications and was laying on the bed watching someone seeing on TV.  She went into check on him and he was shaking and not responding to her.  She called EMS.  PCP is Dr. Maree.  Patient still drives and takes his medications on his own.  DNR/DNI CODE STATUS.  Significant findings today CT of the head with no acute abnormality, chronic right sphenoid sinusitis.  MRI of the brain also negative.  His blood pressure is quite elevated but patient's wife states it has been elevated in his PCP office.  He presented to the ED 9/30 with the same symptoms according to the family and records.  Blood pressure was 189/94 that day.  Patient admitted on Home O2? - no Patient  on home anticoagulant? -  no Patient admitted with Chronic home foley catheter? - no Foley catheter placed or replaced by another service prior to admission? - no central line status: NONE  Mental Status on Initial Consult Visit: The patient is Alert and oriented to PERSON The patient is not Alert And oriented to TIME The patient is Alert and oriented to LOCATION   Problem List, Assessment & Plan    ASSESSMENT & PLAN (In order of descending acuity)  Altered mental status Resolved, pt now at baseline MRI, CT head negative Resembled seizure like activity Referral to neurology, case management will organize Discussed with family abstaining from driving until he is worked up by neurology and getting help with medication organization and administration  Hypertension BP elevated today, family states it has been elevated and PCP is aware Takes Coreg , Olmesartan , amlodipine , hydralazine  at home Gave Hydralazine  IV for high readings in the ER, 161/82 at discharge  Hyperlipidemia Continue statin  BPH without obstruction Continue proscar   Offered admission to the patient for overnight observation and he and his family both declined, feel he is safe to go home.  They are accepting of neurological workup with referral to neurologist in [facility], case management will take care of this on Monday.  Discussed that the patient should not drive until he is worked up by neurology and that it would be best for family to help him organize and administer his medications.  Discussed my recommendation with the ER physician Dr. Cherie.  We appreciate opportunity to consult on this patient.  ADDITIONAL NON-ACUTE FINDINGS, OBSERVATIONS, FAMILY DISCUSSIONS, ETC. (When present):  General; chronically ill-appearing 89 year old male, no acute distress  Cardiovascular; regular rhythm, rate 80s Pulmonary; lungs clear bilaterally, normal breathing effort, 98% on room air Abdomen; soft, nontender,  nondistended Extremities; moves all extremities spontaneously, mild edema bilaterally Neuro; alert, oriented x 2 to person and place, not time but is able to tell me the year he was born, no focal deficits  DVT Prophylaxis Ordered: none, pt discharging  Temp:  [36.4 C (97.5 F)] 36.4 C (97.5 F) Pulse:  [64-75] 71 SpO2 Pulse:  [56-72] 56 Resp:  [14-19] 14 BP: (180-199)/(67-100) 184/100 FiO2 (%):  [21 %] 21 % SpO2:  [96 %-98 %] 98 % Body mass index is 27.62 kg/m. Intake/Output last 3 shifts: No intake/output data recorded.  Consults Requested  IP CONSULT TO STROKE COORDINATOR     In hospital DVT Prophylaxis: None, patient ambulatory  In hospital Nutrition: NPO No Exceptions; Medically necessary    An advanced care planning discussion is  had with patient and/or patient's decisions maker (documented separately).  CODE STATUS :                    DNR/DNI  __________________________________________________________________________   I have discussed this case fully with the referring provider for 35 minutes.   _________________________________________________________ __________________________________________________________  Allergies  Allergen Reactions  . Naproxen Sodium Other (See Comments)    Patient has only 1 kidney and Doctor Doesn't want him to take  Has only one kidney  . Nsaids (Non-Steroidal Anti-Inflammatory Drug)     Patient has only 1 kidney and Doctor  Doesn't want him to take  . Hydrocodone  Nausea Only     Past Medical History[1]  Past Surgical History[2]   Family History[3]   Current Medications[4]  REFER TO EPIC FOR FULL LIST OF CURRENT MEDICATIONS ORDERED ON ADMISSION. THESE ORDERS APPEAR ONLY WHEN RELEASED, WHICH MAY HAPPEN AFTER ADMISSION ONCE PATIENT IS TRANSFERRED FROM THE ED.   Allergies  Allergies[5]    Imaging  MRI brain without contrast Result Date: 06/12/2024 Exam:  MRI Brain without Contrast  History:  89 year old.  Question CVA.  Technique:  Complete brain MRI without IV contrast.  Comparison:  Head CT. Same day.  Findings: No evidence of an acute ischemic infarction, hemorrhage, edema, mass, or mass effect. No abnormal signal intensity throughout the brain parenchyma.  There is mild-moderate, age-appropriate, central and peripheral volume loss. Orbits and sellar/suprasellar region are without focal abnormality. Major intracranial vascular flow voids are patent. Calvarium, midline structures, and imaged extra cranial soft tissues unremarkable.  There is mild mucosal thickening in the frontal and ethmoid and sphenoid sinuses bilaterally. Rightward deviation of the nasal septum.    1. In light of the patient's age, no intracranial abnormality is demonstrated. 2. Mild multi sinus mucosal thickening. Rightward deviation nasal septum.  Signed (Electronic Signature): 06/12/2024 3:57 PM Signed By: Fairy KATHEE Shipper, MD  ECG 12 Lead Result Date: 06/12/2024 Sinus rhythm with premature supraventricular complexes Anteroseptal infarct (cited on or before 19-May-2024) Abnormal ECG When compared with ECG of 19-May-2024 18:33, premature ventricular complexes are no longer present premature supraventricular complexes are now present Confirmed by Cherie Searle ([id number]) on 06/12/2024 3:39:29 PM  XR Chest Portable Result Date: 06/12/2024 Exam:  Portable Chest  History:  Weakness  Technique:  Single frontal view.  Comparison:  Chest radiograph dated 05/19/2024 and prior.  Findings:    Lungs are well-inflated. No focal consolidation or pulmonary edema.  No pleural effusion. No pneumothorax.  Unremarkable cardiomediastinal silhouette.  Aortic arch calcifications.     -- No radiographic evidence of  acute cardiopulmonary process.  Signed (Electronic Signature): 06/12/2024 2:01 PM Signed By: Alyce Bellman, MD  CT Head Wo Contrast Result Date: 06/12/2024 Exam:  CT Head without Contrast  History:  CODE STROKE: Altered mental status,  difficult to arouse. Per EMS, left sided weakness, resolved upon presentation to ED.  Technique: Routine brain CT without IV contrast. AEC (automated exposure control) and/or manual techniques such as size-specific kV and mAs are employed where appropriate to reduce radiation exposure for all CT exams.  Comparison:  05/19/2024  Findings:   Brain:  No intra- or extra-axial hemorrhage, acute territorial infarct, or mass effect. Prominence of the sulci, ventricles, and basilar cisterns suggests moderate global volume loss. Scattered subcortical and periventricular white matter hypoattenuation may be seen with chronic microangiopathic disease. Atherosclerotic calcifications of the carotid siphons and intracranial vertebral arteries.  Soft Tissues:  Lens replacements.  Calvarium:  No fracture.  Sinuses and Mastoids:  Chronic wall hypertrophy and sclerosis with mucosal thickening of the right sphenoid sinus. There is minimal frontoethmoidal sinus mucosal thickening. The mastoid air cells are clear.    1.    No acute intracranial abnormality. Chronic changes as described. 2.    Chronic right sphenoid sinusitis.  Notes: The CODE STROKE results were called to [facility] on 06/12/2024 1:31 PM.  Signed (Electronic Signature): 06/12/2024 1:32 PM Signed By: Thayer Kitty, MD    Lab Results   Recent Labs    06/12/24 1322  WBC 6.8  HGB 13.5  HCT 40.4  PLT 128*   Recent Labs    06/12/24 1322 06/12/24 1342  NA 140  --   K 4.3  --   CL 105  --   CO2 26.0  --   BUN 17  --   CREATININE 1.09  --   GLU 105  --   CALCIUM  9.4  --   ALBUMIN 3.7  --   PROT 6.6  --   BILITOT 0.7  --   AST 14*  --   ALT 17  --   ALKPHOS 89  --   AMMONIA  --  17   Recent Labs    06/12/24 1322  TROPONINI 8  INR 1.00  APTT 26.7   Recent Labs    06/12/24 1340  WBCUA <1  NITRITE Negative  LEUKOCYTESUR Negative  BACTERIA None Seen  RBCUA 3  BLOODU Trace*  GLUCOSEU Negative  PROTEINUA 70 mg/dL*  KETONESU  Negative   No results for input(s): OPIAU, BENZU, TRICYCLIC, PCPU, AMPHU, COCAU, CANNAU, BARBU, ETOH, ACETAMIN, SALICYLATE in the last 72 hours. No results for input(s): PREGTESTUR, PREGPOC in the last 72 hours. No results for input(s): OCCULTBLD, RAPSCRN, CDIFRPCR, CDIFFNAP1, A1C, CHOL, LDL, HDL, TRIG in the last 72 hours. Recent Labs    06/12/24 1422  PHART 7.46*  PCO2ART 34.3*  PO2ART 85  HCO3ART 24.5*  O2SATART 97.7  BEART 1.1      Home Medications   Prior to Admission medications  Medication Dose, Route, Frequency  amlodipine  (NORVASC ) 5 MG tablet At bedtime  aspirin  (ECOTRIN) 81 MG tablet 81 mg, Every morning  atorvastatin  (LIPITOR) 40 MG tablet 40 mg, Every morning  carvedilol  (COREG ) 6.25 MG tablet 6.25 mg, 2 times a day (standard)  finasteride  (PROSCAR ) 5 mg tablet 5 mg, Every morning  furosemide  (LASIX ) 20 MG tablet 20 mg, Every morning  hydrALAZINE  (APRESOLINE ) 50 MG tablet 50 mg, 2 times a day  HYDROcodone -acetaminophen  (NORCO 10-325) 10-325 mg per tablet 1-1.5 tablets, 2 times a day  PRN  olmesartan  (BENICAR ) 40 MG tablet 40 mg, Every morning   Brutus FORBES Shank, FNP Hospitalist, [facility] 06/12/24, 4:32 PM       [1] Past Medical History: Diagnosis Date  . Aortic atherosclerosis   . BPH (benign prostatic hyperplasia)   . CAD (coronary artery disease)    mild per cath 2013  . Cancer    (CMS-HCC)   . Carotid artery disease   . Chronic back pain   . Coronary artery disease   . DDD (degenerative disc disease), cervical    severe C4-C7  . Hepatic cyst   . High cholesterol   . History of transfusion    2008, 2010  . Hypertension   . Kidney stone   . Osteoarthritis   . Peptic stricture of esophagus   . Renal cyst    left  . Skin lesion of neck 10/08/2023  . Stress-induced cardiomyopathy 2013   Takotsubo cardiomyopathy in the setting of acute cholecystitis and renal insufficiency  [2] Past Surgical  History: Procedure Laterality Date  . APPENDECTOMY    . CARDIAC CATHETERIZATION  2013  . CAROTID ENDARTERECTOMY Bilateral    [place] (right 2019, left 2017  . CATARACT EXTRACTION, BILATERAL    . CHOLECYSTECTOMY    . ESOPHAGOGASTRODUODENOSCOPY  2021   with savory dilation  . JOINT REPLACEMENT Bilateral    2 knee replacements 2010, 2014  . LITHOTRIPSY    . NEPHRECTOMY Right 2008  . PR DEBRIDEMENT SUBCUTANEOUS TISSUE 1ST 20 SQ CM/< Right 11/28/2023   Procedure: DEBRIDEMENT, SUBCUTANEOUS TISSUE (INCLUDES EPIDERMIS AND DERMIS, IF PERFORMED); FIRST 20 SQ CM OR LESS;  Surgeon: Celia Duwaine Sauer, MD;  Location: PROCEDURE AREA [facility];  Service: General Surgery  . PR EXC SKIN BENIG 2.1-3 CM REMAINDR BODY Right 10/10/2023   Procedure: EXCISION, BENIGN LESION INCLUDING MARGINS, SCALP/NECK; EXCISED DIAMETER 2.1 TO 3.0 CM;  Surgeon: Celia Duwaine Sauer, MD;  Location: OR [facility];  Service: General Surgery  . PR INCISION & DRAINAGE ABSCESS COMPLICATED/MULTIPLE Right 10/10/2023   Procedure: INCISION AND DRAINAGE OF ABSCESS (EG, CARBUNCLE, SUPPURATIVE HIDRADENITIS, CUTANEOUS OR SUBCUTANEOUS ABSCESS, CYST, FURUNCLE, OR PARONYCHIA); COMPLICATED OR MULTIPLE;  Surgeon: Celia Duwaine Sauer, MD;  Location: OR [facility];  Service: General Surgery  . PR INCISION & DRAINAGE ABSCESS COMPLICATED/MULTIPLE Right 11/28/2023   Procedure: INCISION AND DRAINAGE OF ABSCESS (EG, CARBUNCLE, SUPPURATIVE HIDRADENITIS, CUTANEOUS OR SUBCUTANEOUS ABSCESS, CYST, FURUNCLE, OR PARONYCHIA); COMPLICATED OR MULTIPLE;  Surgeon: Celia Duwaine Sauer, MD;  Location: PROCEDURE AREA [facility];  Service: General Surgery  . PR PUNCH BIOPSY SKIN SINGLE LESION Right 10/10/2023   Procedure: PUNCH BIOPSY OF SKIN (INCLUDING SIMPLE CLOSURE, WHEN PERFORMED); SINGLE LESION;  Surgeon: Celia Duwaine Sauer, MD;  Location: OR [facility];  Service: General Surgery  [3] No family history on file. [4] No current facility-administered medications for  this encounter.  Current Outpatient Medications:  .  amlodipine  (NORVASC ) 5 MG tablet, Take by mouth at bedtime., Disp: , Rfl:  .  aspirin  (ECOTRIN) 81 MG tablet, Take 1 tablet (81 mg total) by mouth every morning., Disp: , Rfl:  .  atorvastatin  (LIPITOR) 40 MG tablet, Take 1 tablet (40 mg total) by mouth every morning., Disp: , Rfl:  .  carvedilol  (COREG ) 6.25 MG tablet, Take 1 tablet (6.25 mg total) by mouth two (2) times a day., Disp: , Rfl:  .  finasteride  (PROSCAR ) 5 mg tablet, Take 1 tablet (5 mg total) by mouth every morning., Disp: , Rfl:  .  furosemide  (LASIX ) 20 MG tablet, Take 1  tablet (20 mg total) by mouth every morning., Disp: , Rfl:  .  hydrALAZINE  (APRESOLINE ) 50 MG tablet, Take 1 tablet (50 mg total) by mouth two (2) times a day., Disp: , Rfl:  .  HYDROcodone -acetaminophen  (NORCO 10-325) 10-325 mg per tablet, Take 1-1.5 tablets by mouth two (2) times a day as needed for pain., Disp: , Rfl:  .  olmesartan  (BENICAR ) 40 MG tablet, Take 1 tablet (40 mg total) by mouth every morning., Disp: , Rfl:  [5] Allergies Allergen Reactions  . Naproxen Sodium Other (See Comments)    Patient has only 1 kidney and Doctor Doesn't want him to take  Has only one kidney  . Nsaids (Non-Steroidal Anti-Inflammatory Drug)     Patient has only 1 kidney and Doctor  Doesn't want him to take  . Hydrocodone  Nausea Only

## 2024-07-02 ENCOUNTER — Ambulatory Visit: Admitting: Diagnostic Neuroimaging

## 2024-07-02 ENCOUNTER — Encounter: Payer: Self-pay | Admitting: Diagnostic Neuroimaging

## 2024-07-02 VITALS — BP 140/88 | HR 58 | Ht 73.5 in | Wt 186.0 lb

## 2024-07-02 DIAGNOSIS — R4689 Other symptoms and signs involving appearance and behavior: Secondary | ICD-10-CM | POA: Diagnosis not present

## 2024-07-02 DIAGNOSIS — R4020 Unspecified coma: Secondary | ICD-10-CM

## 2024-07-02 NOTE — Patient Instructions (Signed)
 TRANSIENT CONFUSION SPELLS (Sept, Oct 2025; possible seizure vs delirium vs hypertensive encephalopathy)  - check EEG  - According to [place] law, you can not drive unless you are seizure / syncope free for at least 6 months and under physician's care.   - Please maintain precautions. Do not participate in activities where a loss of awareness could harm you or someone else. No swimming alone, no tub bathing, no hot tubs, no driving, no operating motorized vehicles (cars, ATVs, motocycles, etc), lawnmowers, power tools or firearms. No standing at heights, such as rooftops, ladders or stairs. Avoid hot objects such as stoves, heaters, open fires. Wear a helmet when riding a bicycle, scooter, skateboard, etc. and avoid areas of traffic. Set your water heater to 120 degrees or less.

## 2024-07-02 NOTE — Progress Notes (Unsigned)
 [facility]  PATIENT: Darius Baxter DOB: Dec 18, 1933  REFERRING CLINICIAN: Elisabeth Brutus BRAVO, PA-C HISTORY FROM: patient  REASON FOR VISIT: new consult   HISTORICAL  CHIEF COMPLAINT:  Chief Complaint  Patient presents with   RM 6     Patient is here with wife Darius Baxter and son [name] for memory and seizures - MRI was done in the hospital recently and did not see signs of a stroke but was sent to Neuro just in case.     HISTORY OF PRESENT ILLNESS:   89 year old male here for evaluation of transient confusion spells.  Patient had 2 episodes occurring in September and October 2025 where he was sleeping, but could not be woken up by his wife.  In the second event he had some shaking movements associated with unresponsiveness.  He also had prolonged confusion afterwards.  He was taken to hospital for evaluation.  Blood pressure was noted to be elevated.  Symptoms lasted for a few hours and then resolved.  Several patient has been having some mild memory loss issues over the last few years.  He lives at home with wife.  He is able to maintain most of his ADLs but started to have a little bit of trouble with medications and finances.   REVIEW OF SYSTEMS: Full 14 system review of systems performed and negative with exception of: as per HPI.  ALLERGIES: Allergies  Allergen Reactions   Nsaids     Patient has only 1 kidney and Doctor  Doesn't want him to take   Hydrocodone  Nausea Only   Vicodin [Hydrocodone -Acetaminophen ] Nausea And Vomiting    IV only; no problems taking oral tablet    HOME MEDICATIONS: Outpatient Medications Prior to Visit  Medication Sig Dispense Refill   acetaminophen  (TYLENOL ) 500 MG tablet Take 1,000 mg by mouth daily as needed for moderate pain.     amLODipine  (NORVASC ) 5 MG tablet Take 5 mg by mouth at bedtime.     aspirin  EC 81 MG tablet Take 1 tablet (81 mg total) by mouth daily. 30 tablet 3   atorvastatin  (LIPITOR) 40 MG tablet Take 40 mg by  mouth at bedtime.      carvedilol  (COREG ) 6.25 MG tablet Take 6.25 mg by mouth 2 (two) times daily with a meal.     finasteride  (PROSCAR ) 5 MG tablet Take 1 tablet (5 mg total) by mouth daily. 90 tablet 3   furosemide  (LASIX ) 20 MG tablet Take 20 mg by mouth daily.     hydrALAZINE  (APRESOLINE ) 50 MG tablet Take 50 mg by mouth 2 (two) times daily.     HYDROcodone -acetaminophen  (NORCO) 7.5-325 MG tablet Take 1-2 tablets by mouth as needed for moderate pain. Takes one tablet 2 times a week.     nitrofurantoin , macrocrystal-monohydrate, (MACROBID ) 100 MG capsule Take 1 capsule (100 mg total) by mouth 2 (two) times daily. 14 capsule 0   olmesartan  (BENICAR ) 40 MG tablet Take 40 mg by mouth daily.     sulfamethoxazole -trimethoprim  (BACTRIM  DS) 800-160 MG tablet Take 1 tablet by mouth 2 (two) times daily. 14 tablet 0   No facility-administered medications prior to visit.    PAST MEDICAL HISTORY: Past Medical History:  Diagnosis Date   Arthritis    Cancer of kidney (HCC)    Carotid artery disease    Chronic lower back pain    Coronary artery disease    Mild at cardiac catheterization 2013   Essential hypertension    History of blood  transfusion 2008; 2010   Mitral valve prolapse    Mild mitral regurgitation   Mixed hyperlipidemia    Myocardial infarction [facility])    2013   Nocturia    Renal cancer (HCC)    Status post right nephrectomy - 09/2006   Seizures (HCC)    Childhood-89 yrs old passed out   Takotsubo cardiomyopathy    February 2013   Ventricular ectopy    Bigeminy    PAST SURGICAL HISTORY: Past Surgical History:  Procedure Laterality Date   APPENDECTOMY     BIOPSY  10/19/2019   Procedure: BIOPSY;  Surgeon: Harvey Margo CROME, MD;  Location: [facility];  Service: Endoscopy;;   CARDIAC CATHETERIZATION     CAROTID ENDARTERECTOMY Left 02/23/2016   CAROTID ENDARTERECTOMY Right 06/11/2018   PATCH ANGIOPLASTY WITH XENOSURE PATCH   CATARACT EXTRACTION W/PHACO  03/22/2011    Procedure: CATARACT EXTRACTION PHACO AND INTRAOCULAR LENS PLACEMENT (IOC);  Surgeon: Cherene Mania;  Location: [facility] OR[facility];  Service: Ophthalmology;  Laterality: Right;  CDE  19.88   CATARACT EXTRACTION W/PHACO  04/16/2011   Procedure: CATARACT EXTRACTION PHACO AND INTRAOCULAR LENS PLACEMENT (IOC);  Surgeon: Cherene Mania;  Location: [facility];  Service: Ophthalmology;  Laterality: Left;  CDE 21.13   CHOLECYSTECTOMY  01/29/2012   Procedure: LAPAROSCOPIC CHOLECYSTECTOMY WITH INTRAOPERATIVE CHOLANGIOGRAM;  Surgeon: Debby LABOR. Cornett, MD;  Location: [facility] OR;  Service: General;  Laterality: N/A;   ENDARTERECTOMY Left 02/23/2016   Procedure: LEFT CAROTID ENDARTERECTOMY WITH XENOSURE BOVINE PERICARDIUM PATCH ANGIOPLASTY;  Surgeon: Gaile LELON New, MD;  Location: [facility] OR;  Service: Vascular;  Laterality: Left;   ENDARTERECTOMY Right 06/11/2018   Procedure: ENDARTERECTOMY CAROTID RIGHT;  Surgeon: New Gaile LELON, MD;  Location: [facility] OR;  Service: Vascular;  Laterality: Right;   ESOPHAGOGASTRODUODENOSCOPY N/A 10/19/2019   one benign-appearing, intrinsic moderate stenosis s/p dilation. Moderate gastritis due to aspirin , mild duodenitis.    JOINT REPLACEMENT     KNEE ARTHROSCOPY Right    LITHOTRIPSY     NEPHRECTOMY Right 2008   [facility] ANGIOPLASTY Right 06/11/2018   Procedure: PATCH ANGIOPLASTY WITH GEORGE LISLE;  Surgeon: New Gaile LELON, MD;  Location: [facility];  Service: Vascular;  Laterality: Right;   SAVORY DILATION N/A 10/19/2019   Procedure: SAVORY DILATION;  Surgeon: Harvey Margo CROME, MD;  Location: [facility];  Service: Endoscopy;  Laterality: N/A;   TOTAL KNEE ARTHROPLASTY  2010   TOTAL KNEE ARTHROPLASTY Left 2014    FAMILY HISTORY: Family History  Problem Relation Age of Onset   CVA Mother    Hypertension Mother    Peripheral Artery Disease Mother        had leg bypass, and died after surgery; believed to be from throwing a clot   CVA Father    Prostate cancer Father    Cancer Other    Stroke Other     Coronary artery disease Other    Hypertension Other    Anesthesia problems Neg Hx    Hypotension Neg Hx    Malignant hyperthermia Neg Hx    Pseudochol deficiency Neg Hx    Seizures Neg Hx     SOCIAL HISTORY: Social History   Socioeconomic History   Marital status: Married    Spouse name: Not on file   Number of children: Not on file   Years of education: Not on file   Highest education level: Not on file  Occupational History   Occupation: retired  Tobacco Use   Smoking status: Never  Passive exposure: Never   Smokeless tobacco: Never  Vaping Use   Vaping status: Never Used  Substance and Sexual Activity   Alcohol use: No    Alcohol/week: 0.0 standard drinks of alcohol   Drug use: No   Sexual activity: Yes    Birth control/protection: None  Other Topics Concern   Not on file  Social History Narrative   Does not regularly exercise.        2 - 1/2 a cup of coffee daily, no tea or soda    Social Drivers of Corporate Investment Banker Strain: Not on file  Food Insecurity: Not on file  Transportation Needs: Not on file  Physical Activity: Not on file  Stress: Not on file  Social Connections: Not on file  Intimate Partner Violence: Not At Risk (11/28/2023)   Received from [facility]   Humiliation, Afraid, Rape, and Kick questionnaire    Within the last year, have you been afraid of your partner or ex-partner?: No    Within the last year, have you been humiliated or emotionally abused in other ways by your partner or ex-partner?: No    Within the last year, have you been kicked, hit, slapped, or otherwise physically hurt by your partner or ex-partner?: No    Within the last year, have you been raped or forced to have any kind of sexual activity by your partner or ex-partner?: No     PHYSICAL EXAM  GENERAL EXAM/CONSTITUTIONAL: Vitals:  Vitals:   07/02/24 1212  BP: (!) 140/88  Pulse: (!) 58  SpO2: 98%  Weight: 186 lb (84.4 kg)  Height: 6' 1.5 (1.867 m)    Body mass index is 24.21 kg/m. Wt Readings from Last 3 Encounters:  07/02/24 186 lb (84.4 kg)  03/11/24 184 lb 3.2 oz (83.6 kg)  01/22/23 199 lb (90.3 kg)   Patient is in no distress; well developed, nourished and groomed; neck is supple  CARDIOVASCULAR: Examination of carotid arteries is normal; no carotid bruits Regular rate and rhythm, no murmurs Examination of peripheral vascular system by observation and palpation is normal  EYES: Ophthalmoscopic exam of optic discs and posterior segments is normal; no papilledema or hemorrhages No results found.  MUSCULOSKELETAL: Gait, strength, tone, movements noted in Neurologic exam below  NEUROLOGIC: MENTAL STATUS:     07/02/2024   12:15 PM  MMSE - Mini Mental State Exam  Orientation to time 4  Orientation to Place 5  Registration 3  Attention/ Calculation 1  Recall 2  Language- name 2 objects 2  Language- repeat 0  Language- follow 3 step command 3  Language- read & follow direction 1  Write a sentence 1  Copy design 0  Total score 22   awake, alert, oriented to person, place and time recent and remote memory intact normal attention and concentration language fluent, comprehension intact, naming intact fund of knowledge appropriate  CRANIAL NERVE:  2nd - no papilledema on fundoscopic exam 2nd, 3rd, 4th, 6th - pupils equal and reactive to light, visual fields full to confrontation, extraocular muscles intact, no nystagmus 5th - facial sensation symmetric 7th - facial strength symmetric 8th - hearing intact 9th - palate elevates symmetrically, uvula midline 11th - shoulder shrug symmetric 12th - tongue protrusion midline  MOTOR:  normal bulk and tone, full strength in the BUE, BLE  SENSORY:  normal and symmetric to light touch, temperature, vibration  COORDINATION:  finger-nose-finger, fine finger movements normal  REFLEXES:  deep tendon reflexes  TRACE and symmetric  GAIT/STATION:  narrow based gait;  USING CANE     DIAGNOSTIC DATA (LABS, IMAGING, TESTING) - I reviewed patient records, labs, notes, testing and imaging myself where available.  Lab Results  Component Value Date   WBC 7.0 05/23/2019   HGB 15.2 05/23/2019   HCT 47.6 05/23/2019   MCV 97.3 05/23/2019   PLT 155 05/23/2019      Component Value Date/Time   NA 135 03/30/2021 1144   K 5.7 (H) 03/30/2021 1144   CL 110 03/30/2021 1144   CO2 22 03/30/2021 1144   GLUCOSE 102 (H) 03/30/2021 1144   BUN 37 (H) 03/30/2021 1144   CREATININE 1.59 (H) 03/30/2021 1144   CALCIUM  9.8 03/30/2021 1144   PROT 6.8 06/05/2018 1101   ALBUMIN 4.0 06/05/2018 1101   AST 26 06/05/2018 1101   ALT 18 06/05/2018 1101   ALKPHOS 72 06/05/2018 1101   BILITOT 1.6 (H) 06/05/2018 1101   GFRNONAA 42 (L) 03/30/2021 1144   GFRAA >60 05/23/2019 1722   Lab Results  Component Value Date   CHOL 143 10/06/2011   HDL 40 10/06/2011   LDLCALC 41 10/06/2011   TRIG 310 (H) 10/06/2011   CHOLHDL 3.6 10/06/2011   No results found for: HGBA1C No results found for: VITAMINB12 Lab Results  Component Value Date   TSH 1.751 10/06/2011    06/12/24 MRI brain  1. In light of the patient's age, no intracranial abnormality is demonstrated.  2. Mild multi sinus mucosal thickening. Rightward deviation nasal septum.    ASSESSMENT AND PLAN  89 y.o. year old male here with:   Dx:  1. Spell of abnormal behavior   2. Loss of consciousness (HCC)     PLAN:  TRANSIENT CONFUSION SPELLS (Sept, Oct 2025; possible seizure vs delirium vs hypertensive encephalopathy)  - check EEG  - According to [place] law, you can not drive unless you are seizure / syncope free for at least 6 months and under physician's care.   - Please maintain precautions. Do not participate in activities where a loss of awareness could harm you or someone else. No swimming alone, no tub bathing, no hot tubs, no driving, no operating motorized vehicles (cars, ATVs, motocycles, etc),  lawnmowers, power tools or firearms. No standing at heights, such as rooftops, ladders or stairs. Avoid hot objects such as stoves, heaters, open fires. Wear a helmet when riding a bicycle, scooter, skateboard, etc. and avoid areas of traffic. Set your water heater to 120 degrees or less.   MILD MEMORY LOSS (since 2023; MMSE 22/30; mild changes in ADLs) - could represent mild cognitive impairment versus mild dementia - try to stay active physically and get some exercise (at least 15-30 minutes per day) - eat a nutritious diet with lean protein, plants / vegetables, whole grains; avoid ultra-processed foods - increase social activities, brain stimulation, games, puzzles, hobbies, crafts, arts, music; try new activities; keep it fun! - aim for at least 7-8 hours sleep per night (or more) - avoid smoking and alcohol - caution with medications, finances, driving - safety / supervision issues reviewed - caregiver resources provided (including [email or web address])  Orders Placed This Encounter  Procedures   EEG adult   Return for pending test results, pending if symptoms worsen or fail to improve.  I reviewed images, labs, notes, records myself. I summarized findings and reviewed with patient, for this high risk condition (abnl spell; seizure evaluation) requiring high complexity decision making.    EDUARD FABIENE HANLON,  MD 07/02/2024, 1:35 PM Certified in Neurology, Neurophysiology and Neuroimaging  [facility] [address], [address] [place], [place] [address] [phone]

## 2024-07-14 ENCOUNTER — Ambulatory Visit: Admitting: Diagnostic Neuroimaging

## 2024-07-14 DIAGNOSIS — R4182 Altered mental status, unspecified: Secondary | ICD-10-CM

## 2024-07-14 DIAGNOSIS — R4689 Other symptoms and signs involving appearance and behavior: Secondary | ICD-10-CM

## 2024-07-14 DIAGNOSIS — R4020 Unspecified coma: Secondary | ICD-10-CM

## 2024-08-05 ENCOUNTER — Telehealth: Payer: Self-pay | Admitting: *Deleted

## 2024-08-06 NOTE — Procedures (Signed)
° °  [facility]  EEG (ELECTROENCEPHALOGRAM) REPORT   STUDY DATE: 07/14/24 PATIENT NAME: Darius Baxter DOB: [birth year] MRN: [id number]  ORDERING CLINICIAN: Eduard Hanlon, MD   TECHNOLOGIST: MARLA Plummer TECHNIQUE: Electroencephalogram was recorded utilizing standard 10-20 system of lead placement and reformatted into average and bipolar montages.  RECORDING TIME: 25 minutes ACTIVATION: photic stimulation  CLINICAL INFORMATION: 89 year old male with abnormal spell.  FINDINGS: Posterior dominant background rhythms, which attenuate with eye opening, ranging 8-9 hertz and 30-35 microvolts. No focal, lateralizing, epileptiform activity or seizures are seen. Patient recorded in the awake and drowsy state. EKG channel shows regular rhythm of 60-65 beats per minute.   IMPRESSION:   Normal EEG in the awake and drowsy states.    INTERPRETING PHYSICIAN:  EDUARD FABIENE HANLON, MD Certified in Neurology, Neurophysiology and Neuroimaging  [facility] [address], [address] [place], [place] [address] [phone]

## 2024-08-07 ENCOUNTER — Ambulatory Visit: Payer: Self-pay | Admitting: Diagnostic Neuroimaging

## 2024-08-07 NOTE — Telephone Encounter (Signed)
EEG is normal .- VRP 

## 2024-08-10 NOTE — Telephone Encounter (Signed)
-----   Message from Eduard Hanlon, MD sent at 08/07/2024  1:59 PM EST ----- EEG is normal. -VRP

## 2024-08-10 NOTE — Telephone Encounter (Signed)
 Spoke to patient gave EEG results Pt thanked me for calling

## 2025-02-22 ENCOUNTER — Other Ambulatory Visit

## 2025-03-01 ENCOUNTER — Ambulatory Visit: Admitting: Urology
